# Patient Record
Sex: Female | Born: 1975 | Race: Black or African American | Hispanic: No | Marital: Married | Smoking: Never smoker
Health system: Southern US, Community
[De-identification: ages and names within clinical notes are randomized; demographics above are authoritative.]

## PROBLEM LIST (undated history)

## (undated) DIAGNOSIS — I10 Essential (primary) hypertension: Secondary | ICD-10-CM

## (undated) DIAGNOSIS — G510 Bell's palsy: Secondary | ICD-10-CM

## (undated) DIAGNOSIS — G459 Transient cerebral ischemic attack, unspecified: Secondary | ICD-10-CM

## (undated) HISTORY — PX: CERVICAL CERCLAGE: SHX1329

## (undated) HISTORY — DX: Essential (primary) hypertension: I10

---

## 2001-08-11 ENCOUNTER — Encounter: Admission: RE | Admit: 2001-08-11 | Discharge: 2001-08-11 | Payer: Self-pay | Admitting: *Deleted

## 2001-08-11 ENCOUNTER — Inpatient Hospital Stay (HOSPITAL_COMMUNITY): Admission: AD | Admit: 2001-08-11 | Discharge: 2001-08-18 | Payer: Self-pay | Admitting: *Deleted

## 2001-08-25 ENCOUNTER — Encounter: Admission: RE | Admit: 2001-08-25 | Discharge: 2001-08-25 | Payer: Self-pay | Admitting: *Deleted

## 2001-09-01 ENCOUNTER — Encounter: Admission: RE | Admit: 2001-09-01 | Discharge: 2001-09-01 | Payer: Self-pay | Admitting: *Deleted

## 2001-09-06 ENCOUNTER — Encounter: Payer: Self-pay | Admitting: *Deleted

## 2001-09-06 ENCOUNTER — Encounter (HOSPITAL_COMMUNITY): Admission: RE | Admit: 2001-09-06 | Discharge: 2001-10-06 | Payer: Self-pay | Admitting: *Deleted

## 2001-09-29 ENCOUNTER — Inpatient Hospital Stay (HOSPITAL_COMMUNITY): Admission: AD | Admit: 2001-09-29 | Discharge: 2001-10-02 | Payer: Self-pay | Admitting: *Deleted

## 2001-10-07 ENCOUNTER — Encounter (HOSPITAL_COMMUNITY): Admission: RE | Admit: 2001-10-07 | Discharge: 2001-11-06 | Payer: Self-pay | Admitting: *Deleted

## 2001-11-02 ENCOUNTER — Inpatient Hospital Stay (HOSPITAL_COMMUNITY): Admission: AD | Admit: 2001-11-02 | Discharge: 2001-11-16 | Payer: Self-pay | Admitting: Obstetrics and Gynecology

## 2001-11-02 ENCOUNTER — Encounter: Payer: Self-pay | Admitting: Obstetrics and Gynecology

## 2001-11-02 ENCOUNTER — Encounter (INDEPENDENT_AMBULATORY_CARE_PROVIDER_SITE_OTHER): Payer: Self-pay | Admitting: Specialist

## 2001-11-07 ENCOUNTER — Encounter: Payer: Self-pay | Admitting: Obstetrics and Gynecology

## 2001-11-18 ENCOUNTER — Inpatient Hospital Stay (HOSPITAL_COMMUNITY): Admission: AD | Admit: 2001-11-18 | Discharge: 2001-11-18 | Payer: Self-pay | Admitting: Obstetrics and Gynecology

## 2003-07-03 ENCOUNTER — Encounter: Admission: RE | Admit: 2003-07-03 | Discharge: 2003-07-03 | Payer: Self-pay | Admitting: *Deleted

## 2003-07-03 ENCOUNTER — Encounter (INDEPENDENT_AMBULATORY_CARE_PROVIDER_SITE_OTHER): Payer: Self-pay | Admitting: *Deleted

## 2003-07-09 ENCOUNTER — Encounter: Payer: Self-pay | Admitting: *Deleted

## 2003-07-09 ENCOUNTER — Inpatient Hospital Stay (HOSPITAL_COMMUNITY): Admission: RE | Admit: 2003-07-09 | Discharge: 2003-07-12 | Payer: Self-pay | Admitting: *Deleted

## 2003-07-19 ENCOUNTER — Encounter: Admission: RE | Admit: 2003-07-19 | Discharge: 2003-07-19 | Payer: Self-pay | Admitting: Family Medicine

## 2003-07-25 ENCOUNTER — Encounter: Admission: RE | Admit: 2003-07-25 | Discharge: 2003-07-25 | Payer: Self-pay | Admitting: *Deleted

## 2003-08-02 ENCOUNTER — Encounter: Admission: RE | Admit: 2003-08-02 | Discharge: 2003-08-02 | Payer: Self-pay | Admitting: Family Medicine

## 2003-08-09 ENCOUNTER — Encounter: Admission: RE | Admit: 2003-08-09 | Discharge: 2003-08-09 | Payer: Self-pay | Admitting: *Deleted

## 2003-08-15 ENCOUNTER — Encounter: Admission: RE | Admit: 2003-08-15 | Discharge: 2003-08-15 | Payer: Self-pay | Admitting: Obstetrics & Gynecology

## 2003-08-22 ENCOUNTER — Encounter: Admission: RE | Admit: 2003-08-22 | Discharge: 2003-08-22 | Payer: Self-pay | Admitting: *Deleted

## 2003-08-29 ENCOUNTER — Encounter: Admission: RE | Admit: 2003-08-29 | Discharge: 2003-08-29 | Payer: Self-pay | Admitting: *Deleted

## 2003-09-05 ENCOUNTER — Inpatient Hospital Stay (HOSPITAL_COMMUNITY): Admission: RE | Admit: 2003-09-05 | Discharge: 2003-09-07 | Payer: Self-pay | Admitting: Obstetrics & Gynecology

## 2003-09-05 ENCOUNTER — Encounter: Admission: RE | Admit: 2003-09-05 | Discharge: 2003-09-05 | Payer: Self-pay | Admitting: *Deleted

## 2003-09-12 ENCOUNTER — Encounter: Admission: RE | Admit: 2003-09-12 | Discharge: 2003-09-12 | Payer: Self-pay | Admitting: *Deleted

## 2003-09-19 ENCOUNTER — Encounter: Admission: RE | Admit: 2003-09-19 | Discharge: 2003-09-19 | Payer: Self-pay | Admitting: *Deleted

## 2003-09-21 ENCOUNTER — Encounter: Admission: RE | Admit: 2003-09-21 | Discharge: 2003-09-21 | Payer: Self-pay | Admitting: Family Medicine

## 2003-09-26 ENCOUNTER — Encounter: Admission: RE | Admit: 2003-09-26 | Discharge: 2003-09-26 | Payer: Self-pay | Admitting: Obstetrics and Gynecology

## 2003-10-03 ENCOUNTER — Encounter: Admission: RE | Admit: 2003-10-03 | Discharge: 2003-10-03 | Payer: Self-pay | Admitting: *Deleted

## 2003-10-10 ENCOUNTER — Encounter: Admission: RE | Admit: 2003-10-10 | Discharge: 2003-10-10 | Payer: Self-pay | Admitting: *Deleted

## 2003-10-17 ENCOUNTER — Encounter: Admission: RE | Admit: 2003-10-17 | Discharge: 2003-10-17 | Payer: Self-pay | Admitting: *Deleted

## 2003-10-24 ENCOUNTER — Encounter: Admission: RE | Admit: 2003-10-24 | Discharge: 2003-10-24 | Payer: Self-pay | Admitting: *Deleted

## 2003-10-31 ENCOUNTER — Encounter: Admission: RE | Admit: 2003-10-31 | Discharge: 2003-10-31 | Payer: Self-pay | Admitting: *Deleted

## 2003-11-07 ENCOUNTER — Encounter: Admission: RE | Admit: 2003-11-07 | Discharge: 2003-11-07 | Payer: Self-pay | Admitting: *Deleted

## 2003-11-08 ENCOUNTER — Inpatient Hospital Stay (HOSPITAL_COMMUNITY): Admission: AD | Admit: 2003-11-08 | Discharge: 2003-11-11 | Payer: Self-pay | Admitting: Obstetrics and Gynecology

## 2003-11-08 ENCOUNTER — Encounter (INDEPENDENT_AMBULATORY_CARE_PROVIDER_SITE_OTHER): Payer: Self-pay | Admitting: *Deleted

## 2003-11-17 ENCOUNTER — Inpatient Hospital Stay (HOSPITAL_COMMUNITY): Admission: AD | Admit: 2003-11-17 | Discharge: 2003-11-17 | Payer: Self-pay | Admitting: Obstetrics and Gynecology

## 2003-11-20 ENCOUNTER — Encounter: Admission: RE | Admit: 2003-11-20 | Discharge: 2003-11-20 | Payer: Self-pay | Admitting: Obstetrics and Gynecology

## 2003-11-27 ENCOUNTER — Encounter: Admission: RE | Admit: 2003-11-27 | Discharge: 2003-11-27 | Payer: Self-pay | Admitting: Obstetrics and Gynecology

## 2003-12-04 ENCOUNTER — Encounter: Admission: RE | Admit: 2003-12-04 | Discharge: 2003-12-04 | Payer: Self-pay | Admitting: Obstetrics and Gynecology

## 2004-05-02 ENCOUNTER — Emergency Department: Payer: Self-pay | Admitting: Emergency Medicine

## 2008-03-06 ENCOUNTER — Encounter (INDEPENDENT_AMBULATORY_CARE_PROVIDER_SITE_OTHER): Payer: Self-pay | Admitting: Internal Medicine

## 2008-05-28 ENCOUNTER — Encounter (INDEPENDENT_AMBULATORY_CARE_PROVIDER_SITE_OTHER): Payer: Self-pay | Admitting: Internal Medicine

## 2008-06-11 ENCOUNTER — Ambulatory Visit: Payer: Self-pay | Admitting: Internal Medicine

## 2008-06-11 DIAGNOSIS — Z87898 Personal history of other specified conditions: Secondary | ICD-10-CM | POA: Insufficient documentation

## 2008-06-11 DIAGNOSIS — R030 Elevated blood-pressure reading, without diagnosis of hypertension: Secondary | ICD-10-CM

## 2008-06-11 HISTORY — DX: Personal history of other specified conditions: Z87.898

## 2008-08-15 ENCOUNTER — Ambulatory Visit: Payer: Self-pay | Admitting: Internal Medicine

## 2008-08-15 DIAGNOSIS — H1189 Other specified disorders of conjunctiva: Secondary | ICD-10-CM | POA: Insufficient documentation

## 2008-08-20 ENCOUNTER — Telehealth (INDEPENDENT_AMBULATORY_CARE_PROVIDER_SITE_OTHER): Payer: Self-pay | Admitting: Internal Medicine

## 2008-09-10 ENCOUNTER — Ambulatory Visit: Payer: Self-pay | Admitting: Internal Medicine

## 2009-01-17 ENCOUNTER — Encounter (INDEPENDENT_AMBULATORY_CARE_PROVIDER_SITE_OTHER): Payer: Self-pay | Admitting: Internal Medicine

## 2009-07-25 ENCOUNTER — Emergency Department (HOSPITAL_COMMUNITY): Admission: EM | Admit: 2009-07-25 | Discharge: 2009-07-25 | Payer: Self-pay | Admitting: Emergency Medicine

## 2010-05-10 ENCOUNTER — Encounter: Payer: Self-pay | Admitting: *Deleted

## 2010-05-11 ENCOUNTER — Encounter: Payer: Self-pay | Admitting: *Deleted

## 2010-09-05 NOTE — Discharge Summary (Signed)
[facility]  Patient:    Margaret Owen, Margaret Owen Visit Number: [id number] MRN: [id number]          Service Type: OBS Location: 910B 9162 01 Attending Physician:  Amada Kingfisher. Dictated by:   Kristen Loader, M.D. Admit Date:  11/02/2001 Disc. Date: 10/02/01                             Discharge Summary  HISTORY OF PRESENT ILLNESS:  The patient is a 35 year old para 0-1-2-1, at 22 weeks, who had a cerclage done at 16 weeks, is admitted with contractions for tocolysis, and fluid support and observation.  The patient did well during her hospitalization.  This reliable patient was discharged on complete bedrest at home after having arranged for home care.  DISCHARGE MEDICATIONS:  IV therapy.  FOLLOWUP:  Return to [facility] in one week.  DISCHARGE DIAGNOSES: 1. Threatened preterm labor. 2. Cerclage. Dictated by:   Kristen Loader, M.D. Attending Physician:  Amada Kingfisher. DD:  11/09/01 TD:  11/14/01 Job: [id number]

## 2010-09-05 NOTE — Op Note (Signed)
[facility]  Patient:    Margaret Owen, Margaret Owen Visit Number: [id number] MRN: [id number]          Service Type: OBS Location: 9400 [id number] [id number] Attending Physician:  Enid Cutter Dictated by:   Conni Elliot, M.D. Proc. Date: 08/12/01 Admit Date:  08/11/2001                             Operative Report  PREOPERATIVE DIAGNOSIS:       Decreased cervical resistance.  POSTOPERATIVE DIAGNOSIS:      Decreased cervical resistance.  OPERATION:                    McDonald cerclage rescue.  SURGEON:                      Conni Elliot, M.D.  ANESTHESIA:                   Spinal.  OPERATIVE FINDINGS:           The cervix was approximately 3-4 cm dilated, with the bag of waters doming a couple of centimeters beyond the external os. The patient was in Trendelenburg position.  The perineum and vagina were prepped with Betadine.  A weighted speculum was placed in the posterior vagina.  The anterior cervix was grasped.  ______ was provided.  A saline-soaked sponge was utilized to retract the membranes back up into the lower segment.  A cerclage was placed using #4 silk double stranded, starting at 12 oclock in a counterclockwise fashion.  This was gradually tied down, with gradual removal of the saline pack, and membranes were still cephalad to the cerclage after tying.  ______ was then reapplied after the procedure. The patient was taken to the recovery room and Foley catheter to straight drainage. Dictated by:   Conni Elliot, M.D. Attending Physician:  Enid Cutter DD:  08/12/01 TD:  08/13/01 Job: [id number]

## 2010-09-05 NOTE — Op Note (Signed)
NAME:  Margaret Owen, Margaret Owen                      ACCOUNT NO.:  [id number]   MEDICAL RECORD NO.:  [id number]                   PATIENT TYPE:  INP   LOCATION:  9133                                 FACILITY:  [facility]   PHYSICIAN:  Tanya S. Shawnie Pons, M.D.                DATE OF BIRTH:  April 16, 1976   DATE OF PROCEDURE:  11/08/2003  DATE OF DISCHARGE:                                 OPERATIVE REPORT   PREOPERATIVE DIAGNOSES:  Intrauterine pregnancy at 36 weeks, early labor,  previous cesarean section x2.   POSTOPERATIVE DIAGNOSES:  Intrauterine pregnancy at 36 weeks, early labor,  previous cesarean section x2.   PROCEDURE:  Repeat low transverse cesarean section.   SURGEON:  Shelbie Proctor. Shawnie Pons, M.D.   ANESTHESIA:  Spinal, Dorinda Hill T. Pamalee Leyden, M.D.   FINDINGS:  Viable female infant, Apgars 7 & 9, weight 5 pounds 1 ounce.   ESTIMATED BLOOD LOSS:  1000 mL.   COMPLICATIONS:  None.   SPECIMENS:  Placenta to pathology.   INDICATIONS FOR PROCEDURE:  The patient is a 35 year old, gravida 6, para 0-  2-3-2 who is at 54 and 1 on admission who was bleeding. She had previously  had a cerclage and __________ was removed the day prior to the cesarean  section. On the day of the C section underwent amniocentesis for fetal lung  maturity which was found to be mature with an __________ of 9:1 and positive  PD.  The patient was scheduled for a C section on the following morning;  however, on the evening after the C section, she developed painful uterine  contractions that did not slow down. She had demonstrated cervical change  and for that reason she was counseled for having a cesarean section that  evening.   DESCRIPTION OF PROCEDURE:  The patient was taken to the OR where spinal  analgesia was administered. She was then placed in a supine position with a  left lateral tilt. She was prepped and draped in the usual sterile fashion  and a Foley catheter was used to drain the bladder.  When anesthesia was  found  to be adequate, a Pfannenstiel incision was made through an old scar.  This was carried down to the underlying fascia with electrocautery.  The  fascia was incised longitudinally with electrocautery.  On dissecting the  superior edge of the fascia off of the underlying rectus, the peritoneal  cavity was entered.  This incision was extended by the surgeon assistant  beginning on either side of the incision. A bladder blade was then placed  inside the abdominal cavity, the bladder was noted to be adherent but down  and a knife was used to make an incision in the lower uterine segment.  The  lower uterine segment was somewhat thick and a moderate amount of bleeding  was noted. Once the amniotic cavity was entered, the uterine incision was  extended with bandage scissors bilaterally.  The  infant was in a vertex  presentation but the head was down in the vagina. This was pulled up and  delivered easily. The head was delivered and then membranes were ruptured  and found to have clear fluid. The nuchal cord was removed, rest of the  infant's body delivered.  The cord was clamped x2, the infant was taken to  the waiting pediatricians.  The placenta was then delivered. The edges of  the uterine incision were grasped and the uterus was closed with a #0 Vicryl  suture in a locked running fashion; however, in delivery of the baby, there  was extension of the patient's left angle into the cervix, this was closed  with #0 Vicryl locked running as well and there was an approximately 1 cm  incision going up into the uterine fundus which was also closed with #0  Vicryl locked running. After the incision was closed, there was noted to be  bleeding on both the right and left edges. __________ pressure and Avitene  were used until hemostasis was complete.  The abdominal cavity was  irrigated, the incision looked at and still found to be hemostatic. At that  point, the fascia was closed with a #0 Vicryl suture  in a running fashion,  subcutaneous tissue irrigated and the bleeders cauterized with the  electrocautery and the skin closed using clips.  `10 mL of 0.25% Marcaine  was then injected into the incision.  The patient tolerated the procedure  well. She was awakened and taken to the recovery room in stable condition.  The infant was taken to the newborn nursery also in stable condition.                                               Shelbie Proctor. Shawnie Pons, M.D.    TSP/MEDQ  D:  11/08/2003  T:  11/09/2003  Job:  [id number]

## 2010-09-05 NOTE — Discharge Summary (Signed)
[facility]  Patient:    Margaret Owen, Margaret Owen Visit Number: [id number] MRN: [id number]          Service Type: MED Location: [id number] Attending Physician:  Enid Cutter Dictated by:   Vear Clock, M.D. Admit Date:  08/11/2001 Discharge Date: 08/18/2001                             Discharge Summary  DISCHARGE DIAGNOSES:          1. A 35 and 0 week intrauterine pregnancy.                               2. Postoperative day six status post cerclage                                  placement.                               3. Preterm labor.  DISCHARGE MEDICATIONS:        1. Procardia XL 30 mg p.o. q.d.                               2. Cleocin 150 mg tablets dissolve in                                  one half cup of water and douche q.h.s.  FOLLOW-UP:                    The patient is to follow up at [facility] on Aug 25, 2001.  She should call to schedule an appointment.  PROCEDURES AND DIAGNOSTIC STUDIES:                      Complete OB ultrasound on August 11, 2001 shows single living IUP, 35 and 4 days with an [facility] of January 30, 2002 which is approximately two weeks behind her LMP; however, since this was a second trimester ultrasound we will go with her initial LMP dating which was January 20, 2002.  Limited anatomic evaluation secondary to early gestational age. Dilated cervix with internal os measuring 2.4 cm in diameter.  Normal AFI.  An operative procedure for McDonald cerclage rescue on August 12, 2001 performed by Dr. Corky Sox.  ADMISSION H&P:                In short, this is a 35 year old, G4, P0-1-2-1 who presented to the [facility] for a new OB visit and was found to have cervical dilatation of 2 to 3 cm, who was sent over for a rescue cerclage. The patient of note, also did require a cerclage placed early in her previous pregnancy and the patient delivered at 28 weeks by [facility] after SROM.  HOSPITAL COURSE:               Cerclage as placed on August 12, 2001.  The patient was found to have decreased cervical resistance.  She was started on Unasyn on admission and has remained afebrile and asymptomatic since admission.  She has been in  Trendelenburg throughout her hospitalization and been on Motrin.  She has also received Cleocin douches every night since the procedure.  The patient did not develop any new problems during her hospitalization.  DISCHARGE LABORATORY DATA:    WBC 10.3, hemoglobin 12.4, platelets 217. Hepatitis B surface antigen negative.  UA on August 11, 2001 showed trace hemoglobin and otherwise was negative.  A repeat UA on Aug 18, 2001 showed blood but no other concerns.  The patient is B positive, antibody negative. GBS negative.  RPR negative. Rubella immune.  No further labs were performed.  The patient was discharged to home without further incident.  Of note, she had been on magnesium after placement of the cerclage for believed preterm labor despite the fact she was not feeling any contractions.  This was discontinued prior to her discharge and she tolerated this well.  She was also started on Procardia XL prior to discharge without any further difficulty. Dictated by:   Vear Clock, M.D. Attending Physician:  Enid Cutter DD:  08/18/01 TD:  08/22/01 Job: [id number]

## 2010-09-05 NOTE — Discharge Summary (Signed)
NAME:  Margaret Owen, Margaret Owen                      ACCOUNT NO.:  [id number]   MEDICAL RECORD NO.:  [id number]                   PATIENT TYPE:  INP   LOCATION:  9133                                 FACILITY:  [facility]   PHYSICIAN:  Phil D. Okey Dupre, M.D.                  DATE OF BIRTH:  1975-05-01   DATE OF ADMISSION:  11/08/2003  DATE OF DISCHARGE:  11/11/2003                                 DISCHARGE SUMMARY   ADMISSION DIAGNOSES:  12. A 35 year old gravida 6 para 0-2-3-2 at 90 and 1 with vaginal bleeding.  2. Cerclage for cervical incompetence.   DISCHARGE DIAGNOSES:  18. A 35 year old gravida 6 para 0-3-3-3 postoperative day #3 status post     repeat lower transverse cesarean section for laboring with a history of     two previous cesarean sections.  2. Viable female with Apgars of 7 at one minute and 9 at five minutes.  3. Postoperative fever x1.   DISCHARGE MEDICATIONS:  1. Percocet 5/325 one to two p.o. q.4-6h. p.r.n. #30.  2. Ibuprofen 600 mg one p.o. q.6h. p.r.n.  3. Depo-Provera injection 150 mg IM x1.  4. Prenatal vitamin one p.o. daily.   ADMISSION HISTORY:  Margaret Owen was admitted to the hospital at 36 and 1 day  for vaginal bleeding.  Her cerclage which was in place at the time of  admission was removed and she was admitted for observation.   HOSPITAL COURSE:  On hospital day #1 she was noted to go into spontaneous  labor.  Because of her two previous C-sections she was taken for a repeat  lower transverse C-section.  Please see that operative note.   POSTOPERATIVE COURSE:  Margaret Owen had a fairly uncomplicated postoperative  course.  She did have one fever following C-section to 101.3.  Following  that fever she defervesced and remained afebrile throughout the rest of her  hospital stay.  She was afebrile for about 48 hours on day of discharge.  Her postoperative hemoglobin was noted to be 10.6.  She is breastfeeding and  her pain is well controlled.   CONDITION ON  DISCHARGE:  Margaret Owen was discharged to home in stable  condition.  Her staples were removed on day of discharge.   INSTRUCTIONS GIVEN TO PATIENT:  The patient was told of her above medical  regimen.  She is to follow up at [facility] in 6 weeks.     Jon Gills, M.D.                     Phil D. Okey Dupre, M.D.    LC/MEDQ  D:  11/11/2003  T:  11/11/2003  Job:  [id number]

## 2010-09-05 NOTE — Discharge Summary (Signed)
NAME:  Margaret Owen, Margaret Owen                      ACCOUNT NO.:  [id number]   MEDICAL RECORD NO.:  [id number]                   PATIENT TYPE:  MAT   LOCATION:  MATC                                 [facility]ACILITY:  [facility]   PHYSICIAN:  Phil D. Okey Dupre, M.D.                  DATE OF BIRTH:  07/13/1975   DATE OF ADMISSION:  11/02/2001  DATE OF DISCHARGE:  11/16/2001                                 DISCHARGE SUMMARY   ADMITTING DIAGNOSES:  Preterm premature rupture of membranes.   DISCHARGE DIAGNOSES:  Preterm delivery of viable newborn by cesarean  section.   HISTORY:  The patient is a 35 year old G5, P0-1-3-1 who presented to the  maternity admissions unit at [redacted] weeks gestation by LMP and second trimester  ultrasound with rupture of membranes.  She denied any contractions or  vaginal bleeding and reported good fetal activity.  She reported clear  vaginal fluid discharge at approximately 2:50 p.m. on the day of admission.  She reported that this vaginal fluid soaked three pads.  On August 12, 2001 a  rescue cerclage had been placed by Dr. Gavin Potters secondary to a speculum  examination on April 24 which showed a cervix that was dilated 2-3 cm with  membranes bulging, but not hourglassing.  Since the cerclage was placed the  patient had been taking Procardia XL 30 mg p.o. q.d.  She also reported  taking amoxicillin 500 mg p.o. q.h.s. and clindamycin douches.  She had been  on bed rest with bathroom privileges since the cerclage had been placed and  had been followed closely by Dr. Gavin Potters in the [facility].  During  this pregnancy she was found to have GBS bacteruria and was admitted for IV  antibiotics around [redacted] weeks gestation.  A urine culture at 23-1/2 weeks grew  out Enterobacteria which was treated with Septra.  She received two doses of  betamethasone on July 8 and July 9 as an outpatient.  Her previous OB  history included a TAB at 16 weeks in 1995, an SAB in 1996, an SAB at 16  weeks in  1997, and a cesarean section for breech at 27 weeks in 2000 which  resulted in the birth of a viable female.  She had a history of a weak  cervix since her TAB in 1995.  A cerclage had been placed with her pregnancy  in 1997 which had failed resulting in an SAB.  Two cerclages had been placed  during her fourth gestation in 2000 which resulted in the preterm birth at  23 weeks at [facility].  On admission the patient was afebrile with normal vital  signs and a benign physical examination.  A speculum examination showed  pooling and a visually closed cervix with the cerclage.  The pooled fluid  was positive for Nitrazine and ferning.  The digital cervical examination  was deferred.  External fetal monitoring showed a baseline  rate in the 150s  with good variability and some mild variable decelerations.  No contractions  were noted, although there was some uterine irritability.   HOSPITAL COURSE:  The patient was admitted to labor and delivery.  A  catheterized urine was sent for UA and culture and she was started on Unasyn  3 g IV q.6h., placed in the Trendelenburg position on strict bed rest.  No  betamethasone was given since she had received two doses one week prior to  admission.  An OB ultrasound was obtained on the day of admission which  showed a single intrauterine pregnancy with normal AFI of 17.8 cm, no  previa, and an assigned gestational age of 2 and 1/7 weeks.  The cervix was  described at 3-4 cm in length.  The UA was within normal limits and the  clean catch urine culture showed 45,000 colonies per ml of group B Strep.  Throughout her hospital course the patient remained afebrile.  She continued  to report a small amount of leaking fluid per her vagina, but no large  gushes of fluid.  A repeat ultrasound was performed on hospital day number  two which found an AFI of 9.4 cm, a decrease from the day prior and a value  that is considered low.  At that time the estimated fetal weight  was in the  75th-90th percentile at 1049 g.  The cerclage was found to be intact.  Throughout her hospital course the fluid that leaked per her vagina was  without any odor.  Fetal heart tracings continued to be reassuring and no  contractions were reported by the patient or picked up on external  monitoring.  The patient continued to report good fetal movement.  External  fetal monitoring was performed for 30 minutes every shift secondary to an  inability to do continuous tracing of such a premature baby.  A third  ultrasound was performed on July 21 which found an AFI of 6.6 illustrating a  continued trend down in the amniotic fluid level.  A BPP at that time was  8/8 and reassuring.  On hospital day number eight the patient reported a  small amount of pale, watery, bloody vaginal discharge.  Fetal heart  tracings continued to remain reassuring.  The patient remained afebrile  without uterine tenderness.  A hour one Glucola was performed on July 23  which was within normal limits.  On hospital day number 10 at 28 and [redacted] weeks  gestation the patient reported loss of a small amount of blood tinged fluid  and passage of an approximately 3 cm dark blood clot.  She denied any other  loss of fluid since hospital day number eight.  She also denied any  contractions, abdominal pain or tenderness, and reported good fetal  movement.  Fetal heart tracings, again, remained reassuring.  The cerclage  was left intact at this point and the patient was closely observed for  further bleeding which was not found to occur.  On hospital day number 11 at  28 and [redacted] weeks gestation the [facility] teaching service was notified of some  repetitive moderate to severe variable decelerations which were picked up on  external fetal monitoring.  Fetal heart rates were found to decrease from a  baseline of approximately 150 down to 55 to 70 beats per minute for 40-50 seconds.  Following each deceleration the fetal heart rate  increased to  baseline or slightly greater than baseline from 140-160 beats per  minute.  There was no gross frank vaginal bleeding, only small flecks of dried blood  as had been common for the patient in the previous couple of days.  The  patient did report feeling some contractions during this spell of variable  decelerations but the tocometer was unable to pick up these contractions  well.  The on-call physician was notified and came in to monitor the  patient.  No immediate action was taken as the fetal heart rate returned to  baseline and was more reassuring.  The patient remained afebrile with no  signs or symptoms of infection.  She was still on her Unasyn as she had been  since admission.  Later that morning after discussion with the team, the  patient's cerclage was removed without difficulty.  At that time her cervix  was found to be dilated 1-2 cm, 80% effaced with a vertex presentation at -  2/-3 station.  An epidural was placed, but was turned off at approximately  2:15 on hospital day number eleven to determine if the patient was having  any uterine contractions since none were being picked up on external  monitoring.  The patient denied feeling any contractions.  The patient  continued to have some variable decelerations with heart rates going down  into the 60s.  She was repositioned, placed on O2, and monitored closely.  An attempt was made to place an IUPC, but was unsuccessful.  Some mild fetal  tachycardia was noted on hospital day number 12, but resolved after Tylenol  was given to the patient.  Early on hospital day number 12 the patient was  noted to have frank bleeding from the vagina and a probable abruption.  The  fetal heart rate was stable and the patient was emergently taken to the  operating room for a stat low transverse cesarean section.  At the time of  the surgery it was noted that the lower uterine segment was extremely thin.  A viable female infant with  Apgars of 6, 6, and 8 at one, five, and 10  minutes, respectively was delivered.  Arterial cord pH was 7.31.  Estimated  blood loss was approximately 800 cc.  The patient recovered well status post  cesarean section and her baby did well in the NICU.  The patient was started  on iron 325 mg p.o. q.d. for a hemoglobin of 8.9 on postoperative day number  one.  Her incision was clean, dry, and intact with staples.  The patient did  complain of some burning with urination which began after her catheter was  removed.  A urinalysis was within normal limits.  The patient was pumping  her breasts and desired Micronor for birth control.  Her staples were left  in for a total of five days (removed after discharge) to allow a small area  of granulation tissue to heal more thoroughly.  The possible risks of future  labor secondary to her thin uterine wall which was noted during the cesarean section were discussed with the patient.  It was the recommendation of Dr.  Gavin Potters that the patient not attempt any future labors.  On hospital day  number 15 the patient was recovering well from her cesarean section and was  deemed ready for discharge.  Her baby remained in the NICU where it  continued to do well on CPAP and taking feedings.   DISPOSITION:  Discharged to home.   CONDITION ON DISCHARGE:  Stable.   DISCHARGE MEDICATIONS:  1.  Percocet 5/325 mg one p.o. q.4h. p.r.n. pain.  2. Ibuprofen 600 mg one p.o. q.6h. p.r.n. cramps.  3. Micronor one pill p.o. q.d.  4. Colace 100 mg one p.o. q.h.s. p.r.n. constipation.  5. Ferrous sulfate 325 mg one p.o. q.d. for anemia.  6. Prenatal vitamins one p.o. q.d.   WOUND CARE:  The patient was instructed that she could place a Band-aid over  the small area of her cesarean section incision that was still healing to  help keep it dry and encourage the healing.   DIET:  Regular.   ACTIVITY:  As instructed per booklet with nothing in her vagina for six   weeks.   FOLLOWUP CARE:  The patient is to return to the maternity admission unit at  [facility] two days after discharge for staple removal.  She is also  to return to [facility] in six weeks for follow-up.  The patient was  instructed to call Women's [facility] for an appointment.     Georgina Peer, M.D.                 Phil D. Okey Dupre, M.D.    JM/MEDQ  D:  12/15/2001  T:  12/16/2001  Job:  [id number]   cc:   [facility]

## 2010-09-05 NOTE — Discharge Summary (Signed)
NAME:  SEPTEMBER, MORMILE                      ACCOUNT NO.:  [id number]   MEDICAL RECORD NO.:  [id number]                   PATIENT TYPE:  INP   LOCATION:  9318                                 FACILITY:  [facility]   PHYSICIAN:  Conni Elliot, M.D.             DATE OF BIRTH:  03/16/1976   DATE OF ADMISSION:  07/09/2003  DATE OF DISCHARGE:  07/12/2003                                 DISCHARGE SUMMARY   HISTORY OF PRESENT ILLNESS:  This is a 35 year old gravida 6 term 0 preterm  2 AB 3 living 2 who was being followed in the high risk clinic who presented  to the hospital on July 10, 2003 for placement of a cerclage.  The patient  had a history of preterm delivery probably secondary to an incompetent  cervix.  The decision was made to place a cerclage.   HOSPITAL COURSE:  The patient did undergo a cerclage placement by Dr. Corky Sox on July 10, 2003 and had a normal hospital course.  She did receive  antibiotics and on July 12, 2003 was stable with no complaints, and was  discharged home.   DISCHARGE INSTRUCTIONS:  1. Bedrest until seen in the high risk clinic.  2. No intercourse.  3. Follow up in the high risk clinic in 1 week.   DISCHARGE DIAGNOSES:  1. History of incompetent cervix.  2. Status post cerclage placement.   MEDICATIONS:  Prenatal vitamins one p.o. daily.     Darl Pikes Christmas, C.N.M.                   Conni Elliot, M.D.    Brent/MEDQ  D:  09/09/2003  T:  09/10/2003  Job:  [id number]

## 2010-09-05 NOTE — Op Note (Signed)
NAME:  Margaret Owen, Margaret Owen                      ACCOUNT NO.:  [id number]   MEDICAL RECORD NO.:  [id number]                   PATIENT TYPE:  INP   LOCATION:  9318                                 [facility]ILITY[facility]   PHYSICIAN:  Conni Elliot, M.D.             DATE OF BIRTH:  1975/12/23   DATE OF PROCEDURE:  07/10/2003  DATE OF DISCHARGE:                                 OPERATIVE REPORT   PREOPERATIVE DIAGNOSIS:  Decreased cervical resistance.   POSTOPERATIVE DIAGNOSIS:  Decreased cervical resistance.   OPERATION:  McDonald cerclage.   DATE OF SURGERY:  July 10, 2003   OPERATOR:  Conni Elliot, M.D.   ANESTHESIA:  Spinal.   DESCRIPTION OF PROCEDURE:  After placing the patient under spinal anesthetic  the patient was placed in the dorsal lithotomy position __________.  The  perineum and vagina were prepped with Betadine solution and Foley catheter  placed to straight drainage.  A weighted speculum was placed in the  posterior vagina.  The anterior cervix was grasped with a sponge stick and  #4 silk double stranded was utilized to place a cervical cerclage starting  at 12 o'clock and going in a counter-clockwise fashion.  This was tied at 12  o'clock.  A clindamycin douche was applied at the end of the procedure.  Estimated blood loss was less than 5 mL and instruments were correct.                                               Conni Elliot, M.D.    ASG/MEDQ  D:  07/10/2003  T:  07/11/2003  Job:  [id number]

## 2010-09-05 NOTE — Discharge Summary (Signed)
NAME:  Margaret Owen, Margaret Owen                      ACCOUNT NO.:  [id number]   MEDICAL RECORD NO.:  [id number]                   PATIENT TYPE:  INP   LOCATION:  9173                                 [facility]ILITY[facility]   PHYSICIAN:  Lesly Dukes, M.D.              DATE OF BIRTH:  1976-02-17   DATE OF ADMISSION:  09/05/2003  DATE OF DISCHARGE:                                 DISCHARGE SUMMARY   ATTENDING PHYSICIAN:  Lesly Dukes, M.D.   INTERN:  Ursula Beath, M.D.   DISCHARGE DIAGNOSES:  1. Intrauterine pregnancy at 27 and 3 days at discharge.  2. Shortened cervix.  3. Incompetent cervix.   DISCHARGE MEDICATIONS:  1. Amoxicillin 500 mg p.o. b.i.d. x7 days.  2. Diflucan 150 mg one p.o. every 3 days for three doses.  3. Prenatal vitamins one p.o. daily.  4. Calcium one p.o. daily.   DIET AT HOME:  Regular.   ACTIVITY:  Bedrest with bathroom privileges.   SEXUAL ACTIVITY:  Do not do anything to make yourself have an orgasm.   She is given preterm labor precautions.   FOLLOW-UP:  At high risk clinic.  She has a previously-scheduled appointment  next Wednesday.   LABORATORY PERFORMED DURING HOSPITALIZATION:  Include a fetal fibronectin  that was negative.  A wet prep that showed few white blood cells; otherwise  negative.  GC and chlamydia probes that were negative.  She had a GBS screen  that was pending at the time of discharge.  She had a transvaginal  ultrasound the day of admission showing a cephalic presentation with an  anterior grade 1 placenta, no previa; a 1.8 cm cervix which is dynamic and  funneling.   HOSPITAL COURSE:  The patient was admitted for bedrest.  She underwent  betamethasone treatment of which she got two doses over the course of the 2-  day admission.  She also received IV Unasyn and had prenatal labs and  cultures collected.  During her hospitalization she did have a few  contractions although they went away after she emptied her bladder.  These  were while she was asleep and did not awaken her the first day of admission.  After that she had no contractions.  Her cervix did not change upon sterile  vaginal exam during her hospitalization.  She  received betamethasone treatment and antibiotic therapy and will be  discharged with oral antibiotics until her GBS cultures come back.  The  patient is discharged to home in improved condition with previously-  mentioned medications and follow-up.     Ursula Beath, MD                     Lesly Dukes, M.D.    JT/MEDQ  D:  09/07/2003  T:  09/07/2003  Job:  [id number]

## 2010-09-05 NOTE — Op Note (Signed)
   NAME:  Margaret Owen, Margaret Owen                      ACCOUNT NO.:  [id number]   MEDICAL RECORD NO.:  [id number]                   PATIENT TYPE:  INP   LOCATION:  9107                                 [facility]ILITY:  [facility]   PHYSICIAN:  Conni Elliot, M.D.             DATE OF BIRTH:  06-24-1975   DATE OF PROCEDURE:  11/13/2001  DATE OF DISCHARGE:  11/16/2001                                 OPERATIVE REPORT   PREOPERATIVE DIAGNOSES:  1. Premature prolonged rupture of membranes at [redacted] week gestation.  2. 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  3. Placental abruption.   POSTOPERATIVE DIAGNOSES:  1. Premature prolonged rupture of membranes at [redacted] weeks gestation.  2. 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  3. Placental abruption.   OPERATION:  Low transverse cesarean.   SURGEON:  Conni Elliot, M.D.   OPERATIVE FINDINGS:  A female infant with Apgars of 6, 6, and 8 at one,  five, and ten minutes respectively.  Cord pH was 7.31.  Placenta was sent to  pathology.   DESCRIPTION OF PROCEDURE:  The patient was brought in an urgent fashion to  the operating room receiving oxygen and was prepped and draped in a sterile  fashion.  A low transverse Pfannenstiel incision was made from the prior  scar.  The incision was made through the skin and subcutaneous fascia.  To  be noted there appeared to be very thin, almost no fascia in the midline.  The peritoneal cavity was entered.  The bladder flap was created.  It also  should be noted that the lower uterine segment was extremely thin and I  would not recommend future labors.  The uterine cavity was entered.  Amniotic fluid was clear.  The baby was in a vertex presentation.  The cord  was milked and then double-clamped and cut and the baby handed immediately  to the neonatologist in attendance.  The placenta was delivered manually.  The uterus, bladder flap, anterior peritoneum, fascia, subcutaneous tissue  and skin were closed.   Estimated blood loss approximately 800 cc.  Needle  and sponge counts correct.                                               Conni Elliot, M.D.    ASG/MEDQ  D:  11/13/2001  T:  11/17/2001  Job:  [id number]

## 2014-10-09 ENCOUNTER — Other Ambulatory Visit (HOSPITAL_COMMUNITY): Payer: Self-pay | Admitting: Family Medicine

## 2014-10-09 DIAGNOSIS — Z1231 Encounter for screening mammogram for malignant neoplasm of breast: Secondary | ICD-10-CM

## 2014-10-29 ENCOUNTER — Ambulatory Visit (HOSPITAL_COMMUNITY): Payer: Self-pay

## 2015-12-25 ENCOUNTER — Ambulatory Visit (INDEPENDENT_AMBULATORY_CARE_PROVIDER_SITE_OTHER): Payer: BLUE CROSS/BLUE SHIELD | Admitting: Advanced Practice Midwife

## 2015-12-25 ENCOUNTER — Encounter: Payer: Self-pay | Admitting: Advanced Practice Midwife

## 2015-12-25 VITALS — BP 144/94 | HR 80 | Wt 178.0 lb

## 2015-12-25 DIAGNOSIS — Z30431 Encounter for routine checking of intrauterine contraceptive device: Secondary | ICD-10-CM | POA: Diagnosis not present

## 2015-12-25 DIAGNOSIS — N898 Other specified noninflammatory disorders of vagina: Secondary | ICD-10-CM | POA: Diagnosis not present

## 2015-12-25 NOTE — Progress Notes (Signed)
[facility] Visit  Patient name: Margaret Owen MRN [id number]  Date of birth: January 20, 1976  CC & HPI:  Margaret Owen is a 40 y.o. African American female presenting today for IUD check. She had a Mirena placed 4 years ago.  Husband had been able to feel string up until a few months ago.  Her periods are about the same, sometimes lighter.  Had BV a year ago.  Gets "an odor" right beofre bleeding sometimes.  Not fishy.  No irritaion or iticing.     Pertinent History Reviewed:  Medical & Surgical Hx:   History reviewed. No pertinent past medical history. Past Surgical History:  Procedure Laterality Date  . CESAREAN SECTION    . CESAREAN SECTION  08/17/1998   Family History  Problem Relation Age of Onset  . Heart failure Father   . Stroke Mother   . Hypertension Mother   . Breast cancer Maternal Aunt   . Colon cancer Maternal Aunt    No current outpatient prescriptions on file. Social History: Reviewed -  reports that she has never smoked. She has never used smokeless tobacco.  Review of Systems:    Constitutional: Negative for fever and chills Eyes: Negative for visual disturbances Respiratory: Negative for shortness of breath, dyspnea Cardiovascular: Negative for chest pain or palpitations  Gastrointestinal: Negative for vomiting, diarrhea and constipation; no abdominal pain Genitourinary: Negative for dysuria and urgency, vaginal irritation or itching Musculoskeletal: Negative for back pain, joint pain, myalgias  Neurological: Negative for dizziness and headaches    Objective Findings:  Vitals: BP (!) 144/94   Pulse 80   Wt 178 lb (80.7 kg)   LMP 12/17/2015   BMI 30.55 kg/m   Physical Examination: General appearance - well appearing, and in no distress Mental status - alert, oriented to person, place, and time Chest:  Normal respiratory effort Heart - normal rate and regular rhythm Abdomen:  Soft, nontender Pelvic: SSE:  Scant vaginal discharge  without odor.  Wet prep negative, few lactobacilli. Strings not visible.  Bedside US shows fundal IUD Musculoskeletal:  Normal range of motion without pain Extremities:  No edema  No results found for this or any previous visit (from the past 24 hour(s)).        Assessment & Plan:  A:   Normal vaginal discharge, maybe lacking some lactobacilli  Normally placed IUD P:  Vaginal probiotic if desires  cytotec  Before IUD removal Return if symptoms worsen or fail to improve.   CRESENZO-DISHMAN,Nyeshia Mysliwiec CNM 12/25/2015 2:55 PM

## 2015-12-25 NOTE — Patient Instructions (Addendum)
     UP4 ADULT Superior, compatible and safe strains DDS  -1 L.acidophilus (super strain) with B. Longum, B.Bifidum & B.Infantis Acid-resistant-survives stomach acid and Bile salts   Fortified with prebiotic Fructooligosaccharide to enhance growth and performance Potency: 15 Billion CFU/capsule Dosage: 1 capsule daily, best before a meal.  Amazon:  $21.90 for 60 caps   iF THERE IS STILL A PROBLEM ADD,   FLORAJEN ACIDOPHILUS High Potency Acidophilus Florajen high potency acidophilus is especially effective for restoring and maintaining a healthy, comfortable balance of vaginal flora. It is also beneficial for overall intestinal health and maintaining the immune system. Garvin cultures per capsule Available in 30 and 60 capsules   Amazon:  $19.99 for 60 caps

## 2016-02-06 ENCOUNTER — Other Ambulatory Visit (HOSPITAL_COMMUNITY): Payer: Self-pay | Admitting: Registered Nurse

## 2016-02-06 DIAGNOSIS — Z1231 Encounter for screening mammogram for malignant neoplasm of breast: Secondary | ICD-10-CM

## 2016-02-17 ENCOUNTER — Ambulatory Visit (HOSPITAL_COMMUNITY): Payer: Self-pay

## 2016-10-05 ENCOUNTER — Encounter: Payer: Self-pay | Admitting: Family Medicine

## 2016-10-05 ENCOUNTER — Ambulatory Visit (INDEPENDENT_AMBULATORY_CARE_PROVIDER_SITE_OTHER): Payer: BC Managed Care – PPO | Admitting: Family Medicine

## 2016-10-05 VITALS — BP 122/80 | HR 80 | Temp 98.4°F | Resp 18 | Ht 64.0 in | Wt 173.0 lb

## 2016-10-05 DIAGNOSIS — Z833 Family history of diabetes mellitus: Secondary | ICD-10-CM | POA: Diagnosis not present

## 2016-10-05 DIAGNOSIS — Z975 Presence of (intrauterine) contraceptive device: Secondary | ICD-10-CM | POA: Diagnosis not present

## 2016-10-05 DIAGNOSIS — R600 Localized edema: Secondary | ICD-10-CM | POA: Insufficient documentation

## 2016-10-05 DIAGNOSIS — Z8249 Family history of ischemic heart disease and other diseases of the circulatory system: Secondary | ICD-10-CM

## 2016-10-05 HISTORY — DX: Localized edema: R60.0

## 2016-10-05 HISTORY — DX: Presence of (intrauterine) contraceptive device: Z97.5

## 2016-10-05 LAB — CBC
HCT: 40.3 % (ref 35.0–45.0)
Hemoglobin: 13.2 g/dL (ref 11.7–15.5)
MCH: 27.4 pg (ref 27.0–33.0)
MCHC: 32.8 g/dL (ref 32.0–36.0)
MCV: 83.6 fL (ref 80.0–100.0)
MPV: 10.2 fL (ref 7.5–12.5)
PLATELETS: 258 10*3/uL (ref 140–400)
RBC: 4.82 MIL/uL (ref 3.80–5.10)
RDW: 13.2 % (ref 11.0–15.0)
WBC: 6.9 10*3/uL (ref 3.8–10.8)

## 2016-10-05 NOTE — Progress Notes (Signed)
Chief Complaint  Patient presents with  . Hypertension   Here for first appointment History of elevated BP but never treated for hypertension Sees GYN for regular care due to IUD No complaints today Is due for lab screening due to family history On no prescription medicine No recent problems with migraine  Patient Active Problem List   Diagnosis Date Noted  . IUD contraception 10/05/2016  . Pedal edema 10/05/2016  . MIGRAINES, HX OF 06/11/2008    Outpatient Encounter Prescriptions as of 10/05/2016  Medication Sig  . levonorgestrel (MIRENA) 20 MCG/24HR IUD 1 each by Intrauterine route once.   No facility-administered encounter medications on file as of 10/05/2016.     No past medical history on file.  Past Surgical History:  Procedure Laterality Date  . CESAREAN SECTION     2000,2003,2005  . CESAREAN SECTION  08/17/1998    Social History   Social History  . Marital status: Married    Spouse name: Harrell Gave  . Number of children: 3  . Years of education: 42   Occupational History  . Artist     state of [place]   Social History Main Topics  . Smoking status: Never Smoker  . Smokeless tobacco: Never Used  . Alcohol use Yes     Comment: occasional  . Drug use: No  . Sexual activity: Yes    Birth control/ protection: IUD   Other Topics Concern  . 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 Heart failure Father   . Alcohol abuse Father   . Heart disease Father 17       died at 74  . Hypertension Father   . Stroke Mother   . Hypertension Mother   . Arthritis Mother   . Asthma Mother   . Diabetes Mother   . Hyperlipidemia Mother   . Glaucoma Mother   . Breast cancer Maternal Aunt   . Colon cancer Maternal Aunt   . Stroke Maternal Aunt   . Glaucoma Sister   . Asthma Brother     Review of Systems    Constitutional: Negative for chills, fever and weight loss.  HENT: Negative for congestion and hearing loss.   Eyes: Negative for blurred vision and pain.  Respiratory: Negative for cough and shortness of breath.   Cardiovascular: Positive for leg swelling. Negative for chest pain.  Gastrointestinal: Negative for abdominal pain, constipation, diarrhea and heartburn.  Genitourinary: Negative for dysuria and frequency.  Musculoskeletal: Negative for falls, joint pain and myalgias.  Neurological: Negative for dizziness, seizures and headaches.  Psychiatric/Behavioral: Negative for depression. The patient is not nervous/anxious and does not have insomnia.     BP 122/80 (BP Location: Right Arm, Patient Position: Sitting, Cuff Size: Normal)   Pulse 80   Temp 98.4 F (36.9 C) (Temporal)   Resp 18   Ht 5\' 4"  (1.626 m)   Wt 173 lb 0.6 oz (78.5 kg)   LMP 09/20/2016 (Exact Date)   SpO2 98%   BMI 29.70 kg/m   Physical Exam  Constitutional: She is oriented to person, place, and time. She appears well-developed and well-nourished.  HENT:  Head: Normocephalic and atraumatic.  Mouth/Throat: Oropharynx is clear and moist.  Eyes: Conjunctivae are normal. Pupils are equal, round, and reactive to light.  Neck: Normal range  of motion. Neck supple. No thyromegaly present.  Cardiovascular: Normal rate, regular rhythm and normal heart sounds.   Pulmonary/Chest: Effort normal and breath sounds normal. No respiratory distress.  Abdominal: Soft. Bowel sounds are normal.  Musculoskeletal: Normal range of motion. She exhibits edema.  trace  Lymphadenopathy:    She has no cervical adenopathy.  Neurological: She is alert and oriented to person, place, and time.  Gait normal  Skin: Skin is warm and dry.  Psychiatric: She has a normal mood and affect. Her behavior is normal. Thought content normal.  Nursing note and vitals reviewed.  ASSESSMENT/PLAN:  1. IUD contraception  2. Pedal edema - CBC -  COMPLETE METABOLIC PANEL WITH GFR - Lipid panel - VITAMIN D 25 Hydroxy (Vit-D Deficiency, Fractures) - Urinalysis, Routine w reflex microscopic  3. Family history of diabetes mellitus  4. Family history of CHF (congestive heart failure)    Patient Instructions  Need records [facility]  Need blood work  Go back to Dr [employer] office for the IUD care  See me annually / as needed   Raylene Everts, MD

## 2016-10-05 NOTE — Patient Instructions (Addendum)
Need records [facility]  Need blood work  Go back to Dr Parker [employer] office for the IUD care  See me annually / as needed

## 2016-10-06 ENCOUNTER — Ambulatory Visit: Payer: Self-pay | Admitting: Family Medicine

## 2016-10-06 ENCOUNTER — Encounter: Payer: Self-pay | Admitting: Family Medicine

## 2016-10-06 LAB — URINALYSIS, ROUTINE W REFLEX MICROSCOPIC
BILIRUBIN URINE: NEGATIVE
GLUCOSE, UA: NEGATIVE
HGB URINE DIPSTICK: NEGATIVE
KETONES UR: NEGATIVE
Leukocytes, UA: NEGATIVE
NITRITE: NEGATIVE
PH: 6.5 (ref 5.0–8.0)
Protein, ur: NEGATIVE
Specific Gravity, Urine: 1.006 (ref 1.001–1.035)

## 2016-10-06 LAB — COMPLETE METABOLIC PANEL WITH GFR
ALT: 16 U/L (ref 6–29)
AST: 14 U/L (ref 10–30)
Albumin: 4.1 g/dL (ref 3.6–5.1)
Alkaline Phosphatase: 57 U/L (ref 33–115)
BUN: 8 mg/dL (ref 7–25)
CHLORIDE: 103 mmol/L (ref 98–110)
CO2: 23 mmol/L (ref 20–31)
CREATININE: 0.75 mg/dL (ref 0.50–1.10)
Calcium: 9.3 mg/dL (ref 8.6–10.2)
GFR, Est Non African American: >89 mL/min (ref >=60)
Glucose, Bld: 89 mg/dL (ref 65–99)
Potassium: 3.8 mmol/L (ref 3.5–5.3)
Sodium: 136 mmol/L (ref 135–146)
Total Bilirubin: 0.6 mg/dL (ref 0.2–1.2)
Total Protein: 6.7 g/dL (ref 6.1–8.1)

## 2016-10-06 LAB — LIPID PANEL
Cholesterol: 151 mg/dL (ref <200)
HDL: 36 mg/dL — ABNORMAL LOW (ref >50)
LDL CALC: 86 mg/dL (ref <100)
TRIGLYCERIDES: 147 mg/dL (ref <150)
Total CHOL/HDL Ratio: 4.2 Ratio (ref <5.0)
VLDL: 29 mg/dL (ref <30)

## 2016-10-06 LAB — VITAMIN D 25 HYDROXY (VIT D DEFICIENCY, FRACTURES): Vit D, 25-Hydroxy: 15 ng/mL — ABNORMAL LOW (ref 30–100)

## 2017-01-28 ENCOUNTER — Ambulatory Visit (INDEPENDENT_AMBULATORY_CARE_PROVIDER_SITE_OTHER): Payer: BC Managed Care – PPO | Admitting: Advanced Practice Midwife

## 2017-01-28 ENCOUNTER — Encounter: Payer: Self-pay | Admitting: Advanced Practice Midwife

## 2017-01-28 VITALS — BP 142/90 | HR 81 | Ht 64.0 in | Wt 175.0 lb

## 2017-01-28 DIAGNOSIS — Z3202 Encounter for pregnancy test, result negative: Secondary | ICD-10-CM | POA: Diagnosis not present

## 2017-01-28 DIAGNOSIS — Z30430 Encounter for insertion of intrauterine contraceptive device: Secondary | ICD-10-CM

## 2017-01-28 DIAGNOSIS — Z30432 Encounter for removal of intrauterine contraceptive device: Secondary | ICD-10-CM | POA: Diagnosis not present

## 2017-01-28 LAB — POCT URINE PREGNANCY: Preg Test, Ur: NEGATIVE

## 2017-01-28 MED ORDER — LEVONORGESTREL 20 MCG/24HR IU IUD
INTRAUTERINE_SYSTEM | Freq: Once | INTRAUTERINE | Status: AC
  Administered 2017-01-28: 17:00:00 via INTRAUTERINE
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
## 2017-02-24 ENCOUNTER — Ambulatory Visit: Payer: BC Managed Care – PPO | Admitting: Obstetrics and Gynecology

## 2017-02-24 ENCOUNTER — Encounter: Payer: Self-pay | Admitting: Obstetrics and Gynecology

## 2017-02-24 ENCOUNTER — Other Ambulatory Visit: Payer: Self-pay | Admitting: Obstetrics and Gynecology

## 2017-02-24 VITALS — BP 142/80 | HR 87 | Ht 64.0 in | Wt 172.6 lb

## 2017-02-24 DIAGNOSIS — D219 Benign neoplasm of connective and other soft tissue, unspecified: Secondary | ICD-10-CM

## 2017-02-24 DIAGNOSIS — Z3202 Encounter for pregnancy test, result negative: Secondary | ICD-10-CM

## 2017-02-24 LAB — POCT URINE PREGNANCY: Preg Test, Ur: NEGATIVE

## 2017-02-24 NOTE — Progress Notes (Signed)
Patient ID: Margaret Owen, female   DOB: 10-May-1975, 41 y.o.   MRN: [id number]   Pt has an IUD in place. She was seen on 01/28/2017 for removal and insertion of her Mirena IUD. At that visit, 4 dark areas were noticed on her cervix and it was suggested she return for a biopsy.   Exam: Transvaginal U/S showed left intramural fibroid, 7.8 cm  PROCEDURE: Cervical biopsy taken on interior lip of cervix.    A:  1. Cervical Lesion, hyperpigmented   P: 1. Will call with her results Results benign pigmented lesion of cervix.  By signing my name below, I, Margit Banda, attest that this documentation has been prepared under the direction and in the presence of Jonnie Kind, MD. Electronically Signed: Margit Banda, Medical Scribe. 02/24/17. 2:29 PM.  I personally performed the services described in this documentation, which was SCRIBED in my presence. The recorded information has been reviewed and considered accurate. It has been edited as necessary during review. Jonnie Kind, MD

## 2017-02-24 NOTE — Progress Notes (Signed)
See above note

## 2017-02-28 ENCOUNTER — Telehealth: Payer: Self-pay | Admitting: Obstetrics and Gynecology

## 2017-02-28 NOTE — Telephone Encounter (Signed)
Unable to speak to pt about results , not at home at time of call.

## 2017-03-08 ENCOUNTER — Telehealth: Payer: Self-pay | Admitting: *Deleted

## 2017-03-08 NOTE — Telephone Encounter (Signed)
Attempted to call pt to notify her of results. Pt unavailable.

## 2017-03-08 NOTE — Telephone Encounter (Signed)
Pt notified of benign pigmented lesions of cervix

## 2017-03-22 ENCOUNTER — Ambulatory Visit: Payer: BC Managed Care – PPO | Admitting: Obstetrics and Gynecology

## 2017-03-22 ENCOUNTER — Encounter: Payer: Self-pay | Admitting: Obstetrics and Gynecology

## 2017-03-22 DIAGNOSIS — N921 Excessive and frequent menstruation with irregular cycle: Secondary | ICD-10-CM | POA: Insufficient documentation

## 2017-03-22 DIAGNOSIS — Z975 Presence of (intrauterine) contraceptive device: Secondary | ICD-10-CM | POA: Insufficient documentation

## 2017-03-22 HISTORY — DX: Excessive and frequent menstruation with irregular cycle: N92.1

## 2017-03-22 NOTE — Progress Notes (Signed)
   [facility] Visit  03/22/2017            Patient name: Margaret Owen MRN [id number]  Date of birth: [birth year]  CC & HPI:  Margaret Owen is a 41 y.o. female presenting today for abnormal vaginal bleeding, and gyn f/u after IUD reinsertion. Associated symptoms not noted. No alleviating factors noted. She has had non-stop vaginal bleeding after having the IUD inserted. At night, when pt is laying down, she feels a 'gush' of blood that is constant throughout the night. She has a hx of fibroid (7 cm), and is concerned it may have been punctured by the IUD. She has not had any ultrasounds recently. She is using the IUD as birth control. She states her periods were typically light before her IUD use. Her husband is able to feel her IUD strings, and states it is uncomfortable  Previous IUD was removed and new Mirena inserted on 01/28/17 by Christin Fudge. Pt has one sister who has fibroids. She states her husband may be willing to have a vasectomy.  ROS:  ROS (+) heavy vaginal bleeding (-) nausea  All systems are negative except as noted in the HPI and PMH.   Pertinent History Reviewed:   Reviewed: Significant for fibroid, [facility]        No past medical history on file.                            Surgical Hx:    Past Surgical History:  Procedure Laterality Date  . CESAREAN SECTION     2000,2003,2005  . CESAREAN SECTION  08/17/1998   Medications: Reviewed & Updated - see associated section                       Current Outpatient Medications:  .  levonorgestrel (MIRENA) 20 MCG/24HR IUD, 1 each by Intrauterine route once., Disp: , Rfl:    Social History: Reviewed -  reports that  has never smoked. she has never used smokeless tobacco.  Objective Findings:  Vitals: Blood pressure 136/72, height 5\' 4"  (1.626 m), weight 174 lb 6.4 oz (79.1 kg).  Physical Examination: General appearance - alert, well appearing, and in no distress Mental status -  alert, oriented to person, place, and time Pelvic - Not indicated  Assessment & Plan:   A:  1. AUB related with IUD,, recently status post IUD replacement, fundal uterine fibroid at 7 cm  P:  1. Schedule transvaginal ultrasound in 1 week and gyn f/u to discuss results    By signing my name below, I, Izna Ahmed, attest that this documentation has been prepared under the direction and in the presence of Jonnie Kind, MD. Electronically Signed: Jabier Gauss, Medical Scribe. 03/22/17. 3:38 PM.  I personally performed the services described in this documentation, which was SCRIBED in my presence. The recorded information has been reviewed and considered accurate. It has been edited as necessary during review. Jonnie Kind, MD

## 2017-03-26 ENCOUNTER — Other Ambulatory Visit: Payer: Self-pay | Admitting: Obstetrics and Gynecology

## 2017-03-26 DIAGNOSIS — D219 Benign neoplasm of connective and other soft tissue, unspecified: Secondary | ICD-10-CM

## 2017-03-26 DIAGNOSIS — N939 Abnormal uterine and vaginal bleeding, unspecified: Secondary | ICD-10-CM

## 2017-03-29 ENCOUNTER — Other Ambulatory Visit: Payer: BC Managed Care – PPO

## 2017-03-31 ENCOUNTER — Ambulatory Visit (INDEPENDENT_AMBULATORY_CARE_PROVIDER_SITE_OTHER): Payer: BC Managed Care – PPO

## 2017-03-31 DIAGNOSIS — N939 Abnormal uterine and vaginal bleeding, unspecified: Secondary | ICD-10-CM

## 2017-03-31 DIAGNOSIS — D219 Benign neoplasm of connective and other soft tissue, unspecified: Secondary | ICD-10-CM | POA: Diagnosis not present

## 2017-03-31 DIAGNOSIS — T8332XA Displacement of intrauterine contraceptive device, initial encounter: Secondary | ICD-10-CM

## 2017-03-31 NOTE — Progress Notes (Signed)
PELVIC US TA/TV: heterogeneous anteverted uterus w/a subserosal fibroid,fundal-mid left uterus 5.7 x 5.3 x 4.8 cm,fibroid appears to be distorting the endometrium,EEC 9.5 mm,IUD is located in the LUS,normal ovaries bilat,no free fluid,no pain during ultrasound,ovaries appear mobile

## 2017-04-14 ENCOUNTER — Ambulatory Visit: Payer: BC Managed Care – PPO | Admitting: Obstetrics and Gynecology

## 2017-04-14 ENCOUNTER — Encounter: Payer: Self-pay | Admitting: Obstetrics and Gynecology

## 2017-04-14 ENCOUNTER — Other Ambulatory Visit: Payer: Self-pay

## 2017-04-14 DIAGNOSIS — N939 Abnormal uterine and vaginal bleeding, unspecified: Secondary | ICD-10-CM

## 2017-04-14 DIAGNOSIS — T8332XA Displacement of intrauterine contraceptive device, initial encounter: Secondary | ICD-10-CM | POA: Insufficient documentation

## 2017-04-14 DIAGNOSIS — D250 Submucous leiomyoma of uterus: Secondary | ICD-10-CM | POA: Diagnosis not present

## 2017-04-14 DIAGNOSIS — Z30432 Encounter for removal of intrauterine contraceptive device: Secondary | ICD-10-CM | POA: Diagnosis not present

## 2017-04-14 HISTORY — DX: Displacement of intrauterine contraceptive device, initial encounter: T83.32XA

## 2017-04-14 MED ORDER — NORELGESTROMIN-ETH ESTRADIOL 150-35 MCG/24HR TD PTWK: 1.0000 | MEDICATED_PATCH | TRANSDERMAL | 12 refills | Status: DC

## 2017-04-14 NOTE — Assessment & Plan Note (Signed)
Switch to [facility]

## 2017-04-14 NOTE — Patient Instructions (Signed)
Hysteroscopy Hysteroscopy is a procedure used for looking inside the womb (uterus). It may be done for various reasons, including:  To evaluate abnormal bleeding, fibroid (benign, noncancerous) tumors, polyps, scar tissue (adhesions), and possibly cancer of the uterus.  To look for lumps (tumors) and other uterine growths.  To look for causes of why a woman cannot get pregnant (infertility), causes of recurrent loss of pregnancy (miscarriages), or a lost intrauterine device (IUD).  To perform a sterilization by blocking the fallopian tubes from inside the uterus.  In this procedure, a thin, flexible tube with a tiny light and camera on the end of it (hysteroscope) is used to look inside the uterus. A hysteroscopy should be done right after a menstrual period to be sure you are not pregnant. LET [facility] CARE PROVIDER KNOW ABOUT:  Any allergies you have.  All medicines you are taking, including vitamins, herbs, eye drops, creams, and over-the-counter medicines.  Previous problems you or members of your family have had with the use of anesthetics.  Any blood disorders you have.  Previous surgeries you have had.  Medical conditions you have. RISKS AND COMPLICATIONS Generally, this is a safe procedure. However, as with any procedure, complications can occur. Possible complications include:  Putting a hole in the uterus.  Excessive bleeding.  Infection.  Damage to the cervix.  Injury to other organs.  Allergic reaction to medicines.  Too much fluid used in the uterus for the procedure.  BEFORE THE PROCEDURE  Ask your health care provider about changing or stopping any regular medicines.  Do not take aspirin or blood thinners for 1 week before the procedure, or as directed by your health care provider. These can cause bleeding.  If you smoke, do not smoke for 2 weeks before the procedure.  In some cases, a medicine is placed in the cervix the day before the procedure.  This medicine makes the cervix have a larger opening (dilate). This makes it easier for the instrument to be inserted into the uterus during the procedure.  Do not eat or drink anything for at least 8 hours before the surgery.  Arrange for someone to take you home after the procedure. PROCEDURE  You may be given a medicine to relax you (sedative). You may also be given one of the following: ? A medicine that numbs the area around the cervix (local anesthetic). ? A medicine that makes you sleep through the procedure (general anesthetic).  The hysteroscope is inserted through the vagina into the uterus. The camera on the hysteroscope sends a picture to a TV screen. This gives the surgeon a good view inside the uterus.  During the procedure, air or a liquid is put into the uterus, which allows the surgeon to see better.  Sometimes, tissue is gently scraped from inside the uterus. These tissue samples are sent to a lab for testing. What to expect after the procedure  If you had a general anesthetic, you may be groggy for a couple hours after the procedure.  If you had a local anesthetic, you will be able to go home as soon as you are stable and feel ready.  You may have some cramping. This normally lasts for a couple days.  You may have bleeding, which varies from light spotting for a few days to menstrual-like bleeding for 3-7 days. This is normal.  If your test results are not back during the visit, make an appointment with your health care provider to find out the  results. This information is not intended to replace advice given to you by your health care provider. Make sure you discuss any questions you have with your health care provider. Document Released: 07/13/2000 Document Revised: 09/12/2015 Document Reviewed: 11/03/2012 Elsevier Interactive Patient Education  2017 [employer] Uterine Fibroids Uterine fibroids are tissue masses (tumors) that can develop in the womb (uterus).  They are also called leiomyomas. This type of tumor is not cancerous (benign) and does not spread to other parts of the body outside of the pelvic area, which is between the hip bones. Occasionally, fibroids may develop in the fallopian tubes, in the cervix, or on the support structures (ligaments) that surround the uterus. You can have one or many fibroids. Fibroids can vary in size, weight, and where they grow in the uterus. Some can become quite large. Most fibroids do not require medical treatment. What are the causes? A fibroid can develop when a single uterine cell keeps growing (replicating). Most cells in the human body have a control mechanism that keeps them from replicating without control. What are the signs or symptoms? Symptoms may include:  Heavy bleeding during your period.  Bleeding or spotting between periods.  Pelvic pain and pressure.  Bladder problems, such as needing to urinate more often (urinary frequency) or urgently.  Inability to reproduce offspring (infertility).  Miscarriages.  How is this diagnosed? Uterine fibroids are diagnosed through a physical exam. Your health care provider may feel the lumpy tumors during a pelvic exam. Ultrasonography and an MRI may be done to determine the size, location, and number of fibroids. How is this treated? Treatment may include:  Watchful waiting. This involves getting the fibroid checked by your health care provider to see if it grows or shrinks. Follow your health care provider's recommendations for how often to have this checked.  Hormone medicines. These can be taken by mouth or given through an intrauterine device (IUD).  Surgery. ? Removing the fibroids (myomectomy) or the uterus (hysterectomy). ? Removing blood supply to the fibroids (uterine artery embolization).  If fibroids interfere with your fertility and you want to become pregnant, your health care provider may recommend having the fibroids  removed. Follow these instructions at home:  Keep all follow-up visits as directed by your health care provider. This is important.  Take over-the-counter and prescription medicines only as told by your health care provider. ? If you were prescribed a hormone treatment, take the hormone medicines exactly as directed.  Ask your health care provider about taking iron pills and increasing the amount of dark green, leafy vegetables in your diet. These actions can help to boost your blood iron levels, which may be affected by heavy menstrual bleeding.  Pay close attention to your period and tell your health care provider about any changes, such as: ? Increased blood flow that requires you to use more pads or tampons than usual per month. ? A change in the number of days that your period lasts per month. ? A change in symptoms that are associated with your period, such as abdominal cramping or back pain. Contact a health care provider if:  You have pelvic pain, back pain, or abdominal cramps that cannot be controlled with medicines.  You have an increase in bleeding between and during periods.  You soak tampons or pads in a half hour or less.  You feel lightheaded, extra tired, or weak. Get help right away if:  You faint.  You have a sudden increase in pelvic  pain. This information is not intended to replace advice given to you by your health care provider. Make sure you discuss any questions you have with your health care provider. Document Released: 04/03/2000 Document Revised: 12/05/2015 Document Reviewed: 10/03/2013 Elsevier Interactive Patient Education  2018 [employer]. Ethinyl Estradiol; Norelgestromin skin patches What is this medicine? ETHINYL ESTRADIOL;NORELGESTROMIN (ETH in il es tra DYE ole; nor el JES troe min) skin patch is used as a contraceptive (birth control method). This medicine combines two types of female hormones, an estrogen and a progestin. This patch is used to  prevent ovulation and pregnancy. This medicine may be used for other purposes; ask your health care provider or pharmacist if you have questions. COMMON BRAND NAME(S): Ortho Becky Sax What should I tell my health care provider before I take this medicine? They need to know if you have or ever had any of these conditions: -abnormal vaginal bleeding -blood vessel disease or blood clots -breast, cervical, endometrial, ovarian, liver, or uterine cancer -diabetes -gallbladder disease -heart disease or recent heart attack -high blood pressure -high cholesterol -kidney disease -liver disease -migraine headaches -stroke -systemic lupus erythematosus (SLE) -tobacco smoker -an unusual or allergic reaction to estrogens, progestins, other medicines, foods, dyes, or preservatives -pregnant or trying to get pregnant -breast-feeding How should I use this medicine? This patch is applied to the skin. Follow the directions on the prescription label. Apply to clean, dry, healthy skin on the buttock, abdomen, upper outer arm or upper torso, in a place where it will not be rubbed by tight clothing. Do not use lotions or other cosmetics on the site where the patch will go. Press the patch firmly in place for 10 seconds to ensure good contact with the skin. Change the patch every 7 days on the same day of the week for 3 weeks. You will then have a break from the patch for 1 week, after which you will apply a new patch. Do not use your medicine more often than directed. Contact your pediatrician regarding the use of this medicine in children. Special care may be needed. This medicine has been used in female children who have started having menstrual periods. A patient package insert for the product will be given with each prescription and refill. Read this sheet carefully each time. The sheet may change frequently. Overdosage: If you think you have taken too much of this medicine contact a poison control center  or emergency room at once. NOTE: This medicine is only for you. Do not share this medicine with others. What if I miss a dose? You will need to replace your patch once a week as directed. If your patch is lost or falls off, contact your health care professional for advice. You may need to use another form of birth control if your patch has been off for more than 1 day. What may interact with this medicine? Do not take this medicine with the following medication: -dasabuvir; ombitasvir; paritaprevir; ritonavir -ombitasvir; paritaprevir; ritonavir This medicine may also interact with the following medications: -acetaminophen -antibiotics or medicines for infections, especially rifampin, rifabutin, rifapentine, and griseofulvin, and possibly penicillins or tetracyclines -aprepitant -ascorbic acid (vitamin C) -atorvastatin -barbiturate medicines, such as phenobarbital -bosentan -carbamazepine -caffeine -clofibrate -cyclosporine -dantrolene -doxercalciferol -felbamate -grapefruit juice -hydrocortisone -medicines for anxiety or sleeping problems, such as diazepam or temazepam -medicines for diabetes, including pioglitazone -modafinil -mycophenolate -nefazodone -oxcarbazepine -phenytoin -prednisolone -ritonavir or other medicines for HIV infection or AIDS -rosuvastatin -selegiline -soy isoflavones supplements -St. Noha Milberger's wort -  tamoxifen or raloxifene -theophylline -thyroid hormones -topiramate -warfarin This list may not describe all possible interactions. Give your health care provider a list of all the medicines, herbs, non-prescription drugs, or dietary supplements you use. Also tell them if you smoke, drink alcohol, or use illegal drugs. Some items may interact with your medicine. What should I watch for while using this medicine? Visit your doctor or health care professional for regular checks on your progress. You will need a regular breast and pelvic exam and Pap smear  while on this medicine. Use an additional method of contraception during the first cycle that you use this patch. If you have any reason to think you are pregnant, stop using this medicine right away and contact your doctor or health care professional. If you are using this medicine for hormone related problems, it may take several cycles of use to see improvement in your condition. Smoking increases the risk of getting a blood clot or having a stroke while you are using hormonal birth control, especially if you are more than 41 years old. You are strongly advised not to smoke. This medicine can make your body retain fluid, making your fingers, hands, or ankles swell. Your blood pressure can go up. Contact your doctor or health care professional if you feel you are retaining fluid. This medicine can make you more sensitive to the sun. Keep out of the sun. If you cannot avoid being in the sun, wear protective clothing and use sunscreen. Do not use sun lamps or tanning beds/booths. If you wear contact lenses and notice visual changes, or if the lenses begin to feel uncomfortable, consult your eye care specialist. In some women, tenderness, swelling, or minor bleeding of the gums may occur. Notify your dentist if this happens. Brushing and flossing your teeth regularly may help limit this. See your dentist regularly and inform your dentist of the medicines you are taking. If you are going to have elective surgery or a MRI, you may need to stop using this medicine before the surgery or MRI. Consult your health care professional for advice. This medicine does not protect you against HIV infection (AIDS) or any other sexually transmitted diseases. What side effects may I notice from receiving this medicine? Side effects that you should report to your doctor or health care professional as soon as possible: -breast tissue changes or discharge -changes in vaginal bleeding during your period or between your  periods -chest pain -coughing up blood -dizziness or fainting spells -headaches or migraines -leg, arm or groin pain -severe or sudden headaches -stomach pain (severe) -sudden shortness of breath -sudden loss of coordination, especially on one side of the body -speech problems -symptoms of vaginal infection like itching, irritation or unusual discharge -tenderness in the upper abdomen -vomiting -weakness or numbness in the arms or legs, especially on one side of the body -yellowing of the eyes or skin Side effects that usually do not require medical attention (report to your doctor or health care professional if they continue or are bothersome): -breakthrough bleeding and spotting that continues beyond the 3 initial cycles of pills -breast tenderness -mood changes, anxiety, depression, frustration, anger, or emotional outbursts -increased sensitivity to sun or ultraviolet light -nausea -skin rash, acne, or brown spots on the skin -weight gain (slight) This list may not describe all possible side effects. Call your doctor for medical advice about side effects. You may report side effects to FDA at 1-800-FDA-1088. Where should I keep my medicine? Keep out of  the reach of children. Store at room temperature between 15 and 30 degrees C (59 and 86 degrees F). Keep the patch in its pouch until time of use. Throw away any unused medicine after the expiration date. Dispose of used patches properly. Since a used patch may still contain active hormones, fold the patch in half so that it sticks to itself prior to disposal. Throw away in a place where children or pets cannot reach. NOTE: This sheet is a summary. It may not cover all possible information. If you have questions about this medicine, talk to your doctor, pharmacist, or health care provider.  2018 Elsevier/Gold Standard (2015-12-16 07:59:03)

## 2017-04-14 NOTE — Progress Notes (Signed)
[facility] Visit  04/14/2017            Patient name: Margaret Owen MRN [id number]  Date of birth: 1976/03/31  CC & HPI:  Margaret Owen is a 41 y.o. female presenting today for f/u appointment after transvaginal U/S on 03/31/17, after AUB related with IUD. Pt was last seen in office on 03/22/17 for non-stop vaginal bleeding after IUD insertion on 01/28/17. She was concerned her fundal uterine fibroid at 7 cm may have been punctured. She has no complaints today. Her period began last night and she states it fels like a normal flow. Her non-stop bleeding had stopped before her office visit on 03/22/17. She does not want any more babies.  ROS:  ROS  - fever - chills All systems are negative except as noted in the HPI and PMH.   Pertinent History Reviewed:   Reviewed: Significant for uterine fibroid, C/S Medical        History reviewed. No pertinent past medical history.                            Surgical Hx:    Past Surgical History:  Procedure Laterality Date  . CESAREAN SECTION     2000,2003,2005  . CESAREAN SECTION  08/17/1998   Medications: Reviewed & Updated - see associated section                       Current Outpatient Medications:  .  levonorgestrel (MIRENA) 20 MCG/24HR IUD, 1 each by Intrauterine route once., Disp: , Rfl:    Social History: Reviewed -  reports that  has never smoked. she has never used smokeless tobacco.  Objective Findings:  Vitals: Blood pressure 138/86, pulse 81, height 5\' 4"  (1.626 m), weight 173 lb (78.5 kg), last menstrual period 04/13/2017.  Physical Examination: General appearance - alert, well appearing, and in no distress Mental status - alert, oriented to person, place, and time Pelvic - examination not indicated U/s reviewed. With pt. Large fibroid is  Discussion: 1. Discussed with pt the results of her ultrasound. Pt told her  fibroids are located in the posterior wall of uterus. IUD placement is low. Chance of her  non-stop heavy bleeding returning discussed.  Hysteroscopic resection and removal procedure for fibroid also discussed. Hysterectomy option also discussed. Pt asked to have IUD removed today. Other options of birth control discussed (patch, ring, shots, low-dose pills). Pt is concerned about weight gain from birth control use. She is considering using the shot.  At end of discussion, pt had opportunity to ask questions and has no further questions at this time.   Specific discussion as noted above. Greater than 50% was spent in counseling and coordination of care with the patient.   Total time greater than: 30 minutes.    IUD Removal  Patient was in the dorsal lithotomy position, normal external genitalia was noted.  A speculum was placed in the patient's vagina, normal discharge was noted, no lesions. The cervix was visualized, no lesions, no abnormal discharge;  and the cervix was swabbed with Betadine using scopettes. The strings of the IUD were grasped and pulled using ring forceps.  The IUD was successfully removed in its entirety. IUD was halfway out of cervix.  Patient tolerated the procedure well.    Future contraception: Patch   Assessment & Plan:   A:  1. Misplaced IUD, removed 2. Submucosal  fibroid 3. AUB secondary to submucosal fribroid 4. Contraception management   P:  1. IUD removal 2. Discussed contraception , IUd, and fibroid management, pt selects Patch, Ortho Evra 3. f/u in 3 months to discuss options.  We have discussed today possibility of running a sonohysterogram to define the type of fibroid, then consider hysteroscopic resection of a type 0 fibroid, or supracervical hysterectomy through her old cesarean section scar.  If her menstrual control is optimal with Ortho Evra, will simply continue that with plans to use the Ortho Evra in a continuous fashion to avoid menses completely  The provider spent more than 45 minutes with the visit, including previsit review,  documentation and exam, with >50% was spent in counseling and coordination of care.  Visit  consisted of a lengthy discussion of ultrasound, examination including IUD removal, then further discussion in the office of treatment options and alternatives  By signing my name below, I, Izna Ahmed, attest that this documentation has been prepared under the direction and in the presence of Jonnie Kind, MD. Electronically Signed: Jabier Gauss, Medical Scribe. 04/14/17. 9:18 AM.  I personally performed the services described in this documentation, which was SCRIBED in my presence. The recorded information has been reviewed and considered accurate. It has been edited as necessary during review. Jonnie Kind, MD

## 2017-06-03 ENCOUNTER — Emergency Department (HOSPITAL_COMMUNITY)
Admission: EM | Admit: 2017-06-03 | Discharge: 2017-06-03 | Disposition: A | Discharge status: Home / Self Care | Payer: BC Managed Care – PPO | Attending: Emergency Medicine | Admitting: Emergency Medicine

## 2017-06-03 ENCOUNTER — Encounter (HOSPITAL_COMMUNITY): Payer: Self-pay | Admitting: Emergency Medicine

## 2017-06-03 ENCOUNTER — Other Ambulatory Visit: Payer: Self-pay

## 2017-06-03 DIAGNOSIS — Z043 Encounter for examination and observation following other accident: Secondary | ICD-10-CM | POA: Insufficient documentation

## 2017-06-03 NOTE — Discharge Instructions (Signed)
Alternate 600 mg of ibuprofen and 500-1000 mg of Tylenol every 3 hours as needed for pain. Do not exceed 4000 mg of Tylenol daily. Ice to areas of soreness for the next few days and then may move to heat. Do some gentle stretching throughout the day, especially during hot showers or baths. Take short frequent walks and avoid prolonged periods of sitting or laying. Expect to be sore for the next few day and follow up with primary care physician for recheck of ongoing symptoms but return to ER for emergent changing or worsening of symptoms such as severe headache that gets worse, altered mental status/behaving unusually, persistent vomiting, excessive drowsiness, numbness to the arms or legs, unsteady gait, or slurred speech. ° °
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
## 2017-06-28 ENCOUNTER — Encounter: Payer: Self-pay | Admitting: Family Medicine

## 2017-07-14 ENCOUNTER — Encounter: Payer: Self-pay | Admitting: Obstetrics and Gynecology

## 2017-07-14 ENCOUNTER — Ambulatory Visit: Payer: BC Managed Care – PPO | Admitting: Obstetrics and Gynecology

## 2017-07-14 VITALS — BP 154/80 | HR 87 | Ht 64.0 in | Wt 166.6 lb

## 2017-07-14 DIAGNOSIS — N920 Excessive and frequent menstruation with regular cycle: Secondary | ICD-10-CM

## 2017-07-14 DIAGNOSIS — D250 Submucous leiomyoma of uterus: Secondary | ICD-10-CM | POA: Diagnosis not present

## 2017-07-14 DIAGNOSIS — R42 Dizziness and giddiness: Secondary | ICD-10-CM | POA: Diagnosis not present

## 2017-07-14 DIAGNOSIS — D251 Intramural leiomyoma of uterus: Secondary | ICD-10-CM

## 2017-07-14 NOTE — Patient Instructions (Signed)
Uterine Artery Embolization for Fibroids Uterine artery embolization is a nonsurgical treatment to shrink fibroids. A thin plastic tube (catheter) is used to inject material that blocks off the blood supply to the fibroid, which causes the fibroid to shrink. Tell a health care provider about:  Any allergies you have.  All medicines you are taking, including vitamins, herbs, eye drops, creams, and over-the-counter medicines.  Any problems you or family members have had with anesthetic medicines.  Any blood disorders you have.  Any surgeries you have had.  Any medical conditions you have. What are the risks?  Injury to the uterus from decreased blood supply  Infection.  Blood infection (septicemia).  Lack of menstrual periods (amenorrhea).  Death of tissue cells (necrosis) around your bladder or vulva.  Development of a hole between organs or from an organ to the surface of your skin (fistula).  Blood clot in the legs (deep vein thrombosis) or lung (pulmonary embolus). What happens before the procedure?  Ask your health care provider about changing or stopping your regular medicines.  Do not take aspirin or blood thinners (anticoagulants) for 1 week before the surgery or as directed by your health care provider.  Do not eat or drink anything for 8 hours before the surgery or as directed by your health care provider.  Empty your bladder before the procedure begins. What happens during the procedure?  An IV tube will be placed into one of your veins. This will be used to give you a sedative and pain medication (conscious sedation).  You will be given a medicine that numbs the area (local anesthetic).  A small cut will be made in your groin. A catheter is then inserted into the main artery of your leg.  The catheter will be guided through the artery to your uterus. A series of images will be taken while dye is injected through the catheter in your groin. X-rays are taken at  the same time. This is done to provide a road map of the blood supply to your uterus and fibroids.  Tiny plastic spheres, about the size of sand grains, will be injected through the catheter. Metal coils may be used to help block the artery. The particles will lodge in tiny branches of the uterine artery that supplies blood to the fibroids.  The procedure is repeated on the artery that supplies the other side of the uterus.  The catheter is then removed and pressure is held to stop any bleeding. No stitches are needed.  A dressing is then placed over the cut (incision). What happens after the procedure?  You will be taken to a recovery area where your progress will be monitored until you are awake, stable, and taking fluids well. If there are no other problems, you will then be moved to a regular hospital room.  You will be observed overnight in the hospital.  You will have cramping that should be controlled with pain medication. This information is not intended to replace advice given to you by your health care provider. Make sure you discuss any questions you have with your health care provider. Document Released: 06/22/2005 Document Revised: 09/12/2015 Document Reviewed: 10/20/2012 Elsevier Interactive Patient Education  2018 [employer].    Tranexamic acid oral tablets What is this medicine? TRANEXAMIC ACID (TRAN ex AM ik AS id) slows down or stops blood clots from being broken down. This medicine is used to treat heavy monthly menstrual bleeding. This medicine may be used for other purposes; ask  your health care provider or pharmacist if you have questions. COMMON BRAND NAME(S): Cyklokapron, Lysteda What should I tell my health care provider before I take this medicine? They need to know if you have any of these conditions: -bleeding in the brain -blood clotting problems -kidney disease -vision problems -an unusual allergic reaction to tranexamic acid, other medicines, foods,  dyes, or preservatives -pregnant or trying to get pregnant -breast-feeding How should I use this medicine? Take this medicine by mouth with a glass of water. Follow the directions on the prescription label. Do not cut, crush, or chew this medicine. You can take it with or without food. If it upsets your stomach, take it with food. Take your medicine at regular intervals. Do not take it more often than directed. Do not stop taking except on your doctor's advice. Do not take this medicine until your period has started. Do not take it for more than 5 days in a row. Do not take this medicine when you do not have your period. Talk to your pediatrician regarding the use of this medicine in children. While this drug may be prescribed for female children as young as 75 years of age for selected conditions, precautions do apply. Overdosage: If you think you have taken too much of this medicine contact a poison control center or emergency room at once. NOTE: This medicine is only for you. Do not share this medicine with others. What if I miss a dose? If you miss a dose, take it when you remember, and then take your next dose at least 6 hours later. Do not take more than 2 tablets at a time to make up for missed doses. What may interact with this medicine? Do not take this medicine with any of the following medications: -estrogens -birth control pills, patches, injections, rings or other devices that contain both an estrogen and a progestin This medicine may also interact with the following medications: -certain medicines used to help your blood clot -tretinoin (taken by mouth) This list may not describe all possible interactions. Give your health care provider a list of all the medicines, herbs, non-prescription drugs, or dietary supplements you use. Also tell them if you smoke, drink alcohol, or use illegal drugs. Some items may interact with your medicine. What should I watch for while using this  medicine? Tell your doctor or healthcare professional if your symptoms do not start to get better or if they get worse. Tell your doctor or healthcare professional if you notice any eye problems while taking this medicine. Your doctor will refer you to an eye doctor who will examine your eyes. What side effects may I notice from receiving this medicine? Side effects that you should report to your doctor or health care professional as soon as possible: -allergic reactions like skin rash, itching or hives, swelling of the face, lips, or tongue -breathing difficulties -changes in vision -sudden or severe pain in the chest, legs, head, or groin -unusually weak or tired Side effects that usually do not require medical attention (report to your doctor or health care professional if they continue or are bothersome): -back pain -headache -muscle or joint aches -sinus and nasal problems -stomach pain -tiredness This list may not describe all possible side effects. Call your doctor for medical advice about side effects. You may report side effects to FDA at 1-800-FDA-1088. Where should I keep my medicine? Keep out of the reach of children. Store at room temperature between 15 and 30 degrees C (  59 and 86 degrees F). Throw away any unused medicine after the expiration date. NOTE: This sheet is a summary. It may not cover all possible information. If you have questions about this medicine, talk to your doctor, pharmacist, or health care provider.  2018 Elsevier/Gold Standard (2015-05-09 09:12:15)

## 2017-07-14 NOTE — Progress Notes (Signed)
[facility] Visit  07/14/2017            Patient name: Margaret Owen MRN [id number]  Date of birth: Jan 11, 1976  CC & HPI:  IKIA CINCOTTA is a 42 y.o. female presenting today for a follow-up to discuss options to manage her submucosal fibroid, including sonohysterogram, hysteroscopy, supracervical hysterectomy. She has been on the Ortho Evra patch since her last visit on 05/15/16. She states her flow is still moderately heavy for 3 days during the 7 day period when she is off the patch. She has associated symptoms of lightheadedness, and states this occurs at the same time she feels her flow occurring. She mentioned she had some breakthrough bleeding occurring before her period began. No alleviating factors noted. She has not tried any other medications for relief. Patient's last menstrual period was 06/30/2017 (approximate), and ended on the 22nd.    ROS:  ROS  (+) menorrhagia (+)lightheadedness All systems are negative except as noted in the HPI and PMH.    Pertinent History Reviewed:   Reviewed: Significant for c-section, uterine fibroid Medical        History reviewed. No pertinent past medical history.                            Surgical Hx:    Past Surgical History:  Procedure Laterality Date   CESAREAN SECTION     2000,2003,2005   CESAREAN SECTION  08/17/1998   Medications: Reviewed & Updated - see associated section                       Current Outpatient Medications:    norelgestromin-ethinyl estradiol (ORTHO EVRA) 150-35 MCG/24HR transdermal patch, Place 1 patch onto the skin once a week., Disp: 3 patch, Rfl: 12   Social History: Reviewed -  reports that she has never smoked. She has never used smokeless tobacco.  Objective Findings:  Vitals: Blood pressure (!) 154/80, pulse 87, height 5\' 4"  (1.626 m), weight 166 lb 9.6 oz (75.6 kg), last menstrual period 06/30/2017.  PHYSICAL EXAMINATION General appearance - alert, well appearing, and in no  distress, oriented to person, place, and time and normal appearing weight Mental status - alert, oriented to person, place, and time, normal mood, behavior, speech, dress, motor activity, and thought processes  PELVIC Not indicated  Discussion: 1. Discussed with pt risks and benefits of Lysteda, as an option to manage her heavy bleeding instead of considering surgical management. Further review of recent changes in guidelines, where to be on the safe side, usage needs to stop of tranexamic acid while on birth control, due to slightly increased risk of clots  2. The previous options discussed during her last visit were also talked over (hysterectomy, hysteroscopy, resection and removal). Embolization was also discussed as an option. Estradiol booster pill offered as well if she continues to breakthrough bleed/spot.   At end of discussion, pt had opportunity to ask questions and has no further questions at this time.   Specific discussion as noted above. Greater than 50% was spent in counseling and coordination of care with the patient.   Total time greater than: 25 minutes.    Assessment & Plan:   A:  1.  Uterine fibroids with menorrhagia, adequately controlled at present with patches   P: considered lysteda but it is contraindicated when pt is on  Combined OCP 1. Pt will contact if she  needs a booster pill for breakthrough bleeding, would add estradiol 1 mg 1-2 x /d with BTB 2. F/u in 1 year    By signing my name below, I, Izna Ahmed, attest that this documentation has been prepared under the direction and in the presence of Jonnie Kind, MD. Electronically Signed: Jabier Gauss, Medical Scribe. 07/14/17. 9:27 AM.  I personally performed the services described in this documentation, which was SCRIBED in my presence. The recorded information has been reviewed and considered accurate. It has been edited as necessary during review. Jonnie Kind, MD

## 2018-01-10 ENCOUNTER — Other Ambulatory Visit: Payer: BC Managed Care – PPO | Admitting: Obstetrics and Gynecology

## 2018-01-11 ENCOUNTER — Other Ambulatory Visit (HOSPITAL_COMMUNITY)
Admission: RE | Admit: 2018-01-11 | Discharge: 2018-01-11 | Disposition: A | Discharge status: Home / Self Care | Payer: BC Managed Care – PPO | Source: Ambulatory Visit | Attending: Obstetrics and Gynecology | Admitting: Obstetrics and Gynecology

## 2018-01-11 ENCOUNTER — Ambulatory Visit (INDEPENDENT_AMBULATORY_CARE_PROVIDER_SITE_OTHER): Payer: BC Managed Care – PPO | Admitting: Obstetrics and Gynecology

## 2018-01-11 ENCOUNTER — Encounter: Payer: Self-pay | Admitting: Obstetrics and Gynecology

## 2018-01-11 VITALS — BP 157/92 | HR 84 | Ht 65.5 in | Wt 169.4 lb

## 2018-01-11 DIAGNOSIS — Z1211 Encounter for screening for malignant neoplasm of colon: Secondary | ICD-10-CM

## 2018-01-11 DIAGNOSIS — Z1212 Encounter for screening for malignant neoplasm of rectum: Secondary | ICD-10-CM

## 2018-01-11 DIAGNOSIS — D251 Intramural leiomyoma of uterus: Secondary | ICD-10-CM

## 2018-01-11 DIAGNOSIS — Z01411 Encounter for gynecological examination (general) (routine) with abnormal findings: Secondary | ICD-10-CM

## 2018-01-11 DIAGNOSIS — Z01419 Encounter for gynecological examination (general) (routine) without abnormal findings: Secondary | ICD-10-CM | POA: Insufficient documentation

## 2018-01-11 DIAGNOSIS — D649 Anemia, unspecified: Secondary | ICD-10-CM

## 2018-01-11 DIAGNOSIS — D250 Submucous leiomyoma of uterus: Secondary | ICD-10-CM

## 2018-01-11 LAB — POCT HEMOGLOBIN: HEMOGLOBIN: 9.2 g/dL — AB (ref 12.2–16.2)

## 2018-01-11 LAB — HEMOCCULT GUIAC POC 1CARD (OFFICE): Fecal Occult Blood, POC: NEGATIVE

## 2018-01-11 NOTE — Patient Instructions (Signed)
Sonohysterogram A sonohysterogram is a procedure to examine the inside of the uterus. This exam uses sound waves that are sent to a computer to make images of the lining of the uterus (endometrium). To get the best images, a germ-free, salt-water solution (sterile saline) is put into the uterus through the vagina. You may have this procedure if you have certain reproductive problems, such as abnormal bleeding, infertility, or miscarriage. This procedure can show what may be causing these problems. Possible causes include scarring or abnormal growths such as fibroids inside your uterus. It can also show if your uterus is an abnormal shape or if the lining of the uterus is too thin. Tell a health care provider about:  All medicines you are taking, including vitamins, herbs, eye drops, creams, and over-the-counter medicines.  Any allergies you have.  Any blood disorders you have.  Any surgeries you have had.  Any medical conditions you have.  Whether you are pregnant or may be pregnant.  The date of the first day of your last period.  Any signs of infection, such as fever, pain in your lower abdomen, or abnormal discharge from your vagina. What are the risks? Generally, this is a safe procedure. However, problems may occur, including:  Abdominal pain or cramping.  Light bleeding (spotting).  Increased vaginal discharge.  Infection. What happens before the procedure?  Your health care provider may have you take an over-the-counter pain medicine.  You may be given medicine to stop any abnormal bleeding.  You may be given antibiotic medicine to help prevent infection.  You may be asked to take a pregnancy test. This is usually in the form of a urine test.  You may have a pelvic exam.  You will be asked to empty your bladder. What happens during the procedure?  You will lie down on the exam table with your feet in stirrups or with your knees bent and your feet flat on the  table.  A slender, handheld device (transducer) will be lubricated and placed into your vagina.  The transducer will be positioned to send sound waves to your uterus. The sound waves are sent to a computer and are turned into images, which your health care provider sees during the procedure.  The transducer will be removed from your vagina.  An instrument will be inserted to widen the opening of your vagina (speculum).  A swab with germ-killing solution (antiseptic) will be used to clean the opening to your uterus (cervix).  A long, thin tube (catheter) will be placed through your cervix into your uterus.  The speculum will be removed.  The transducer will be placed back into your vagina to take more images.  Your uterus will be filled with a germ-free, salt-water solution (sterile saline) through the catheter. You may feel some cramping.  A fluid that contains air bubbles may be sent through the catheter to make it easier to see the fallopian tubes.  The transducer and catheter will be removed. The procedure may vary among health care providers and hospitals. What happens after the procedure?  It is up to you to get the results of your procedure. Ask your health care provider, or the department that is doing the procedure, when your results will be ready. Summary  A sonohysterogram is a procedure that creates images of the inside of the uterus.  The risks of this procedure are very low. Most women experience cramping and spotting after the procedure.  You may need to have a   pelvic exam and take a pregnancy test before this procedure. This procedure will not be done if you are pregnant or have an infection. This information is not intended to replace advice given to you by your health care provider. Make sure you discuss any questions you have with your health care provider. Document Released: 08/21/2013 Document Revised: 03/02/2016 Document Reviewed: 03/02/2016 Elsevier  Interactive Patient Education  2017 Else[employer]  

## 2018-01-11 NOTE — Progress Notes (Signed)
   [facility] Visit  @DATE @            Patient name: Margaret Owen MRN [id number]  Date of birth: [birth year]  CC & HPI:  Margaret Owen is a 41 y.o. female presenting today for follow-up discussion regarding submucosal fibroid.  The patient is here for visit of over 45 minutes including lengthy discussion of treatment options ranging from embolization to hysteroscopic resection 2 General Information over hysterectomy, hysteroscopy, and fibroids in general.  Given several brochures  Essentially this 42 year old female status post cesarean section x3 with known single submucosal fibroid was seen 6 months ago, treated with Ortho Evra oral contraceptive patches which worked fairly well until recently.  Most recently she had a.  Which is lasted since late August until recently she had a 2-day break and then has been bleeding last 2 days. Hemoglobin is 9.2   ROS:  ROS   Pertinent History Reviewed:   Reviewed: Significant for \\Medical         History reviewed. No pertinent past medical history.                            Surgical Hx:    Past Surgical History:  Procedure Laterality Date  . CESAREAN SECTION     2000,2003,2005  . CESAREAN SECTION  08/17/1998   Medications: Reviewed & Updated - see associated section                       Current Outpatient Medications:  .  norelgestromin-ethinyl estradiol (ORTHO EVRA) 150-35 MCG/24HR transdermal patch, Place 1 patch onto the skin once a week., Disp: 3 patch, Rfl: 12   Social History: Reviewed -  reports that she has never smoked. She has never used smokeless tobacco.  Objective Findings:  Vitals: Blood pressure (!) 157/92, pulse 84, height 5' 5.5" (1.664 m), weight 169 lb 6.4 oz (76.8 kg), last menstrual period 12/14/2017.  PHYSICAL EXAMINATION General appearance - alert, well appearing, and in no distress, oriented to person, place, and time and normal appearing weight Mental status - alert, oriented to person, place,  and time, normal mood, behavior, speech, dress, motor activity, and thought processes Chest - clear to auscultation, no wheezes, rales or rhonchi, symmetric air entry Heart - normal rate and regular rhythm Abdomen - soft, nontender, nondistended, no masses or organomegaly Breasts -  Skin -  Physical Examination: Pelvic - VULVA: normal appearing vulva with no masses, tenderness or lesions, VAGINA: normal appearing vagina with normal color and discharge, no lesions, CERVIX: normal appearing cervix without discharge or lesions, UTERUS: uterus is normal size, shape, consistency and nontender, enlarged to 8-10 week's size, ADNEXA: normal adnexa in size, nontender and no masses  P   Assessment & Plan:   A:  1.  Submucosal fibroid, probably type 0 or type I 2. Menorrhagia with anemia  P:  1.  Sonohysterogram, to be scheduled in 2 weeks 2. Patient to pursue talking to interventional radiology about success rate of interventional radiology embolization of type 0 or type I fibroids 3    iron tablets daily

## 2018-01-17 LAB — CYTOLOGY - PAP
Chlamydia: NEGATIVE
DIAGNOSIS: NEGATIVE
HPV (WINDOPATH): NOT DETECTED
Neisseria Gonorrhea: NEGATIVE

## 2018-01-25 ENCOUNTER — Telehealth: Payer: Self-pay | Admitting: Obstetrics and Gynecology

## 2018-01-25 MED ORDER — METRONIDAZOLE 500 MG PO TABS: 500.0000 mg | ORAL_TABLET | Freq: Two times a day (BID) | ORAL | 1 refills | Status: DC

## 2018-01-25 NOTE — Telephone Encounter (Signed)
Left message for pt that I will tx Metronidazole 500 bid x 7 days, then reassess.

## 2018-01-28 ENCOUNTER — Other Ambulatory Visit: Payer: Self-pay | Admitting: Obstetrics and Gynecology

## 2018-01-28 DIAGNOSIS — D250 Submucous leiomyoma of uterus: Secondary | ICD-10-CM

## 2018-01-31 ENCOUNTER — Other Ambulatory Visit: Payer: BC Managed Care – PPO

## 2018-01-31 ENCOUNTER — Telehealth: Payer: Self-pay | Admitting: *Deleted

## 2018-01-31 NOTE — Telephone Encounter (Signed)
Patient called stating she has started her cycle and wants to see if she needs to reschedule.  Discussed with Dr Glo Herring who states it would be best if she rescheduled.  Verbalized understanding and will reschedule.

## 2018-02-08 ENCOUNTER — Other Ambulatory Visit: Payer: BC Managed Care – PPO

## 2018-02-24 ENCOUNTER — Ambulatory Visit: Payer: BC Managed Care – PPO

## 2018-02-24 ENCOUNTER — Other Ambulatory Visit: Payer: Self-pay | Admitting: Obstetrics and Gynecology

## 2018-02-24 ENCOUNTER — Ambulatory Visit (INDEPENDENT_AMBULATORY_CARE_PROVIDER_SITE_OTHER): Payer: BC Managed Care – PPO | Admitting: Obstetrics and Gynecology

## 2018-02-24 DIAGNOSIS — D500 Iron deficiency anemia secondary to blood loss (chronic): Secondary | ICD-10-CM

## 2018-02-24 DIAGNOSIS — D251 Intramural leiomyoma of uterus: Secondary | ICD-10-CM

## 2018-02-24 DIAGNOSIS — D250 Submucous leiomyoma of uterus: Secondary | ICD-10-CM

## 2018-02-24 DIAGNOSIS — D219 Benign neoplasm of connective and other soft tissue, unspecified: Secondary | ICD-10-CM

## 2018-02-24 DIAGNOSIS — N920 Excessive and frequent menstruation with regular cycle: Secondary | ICD-10-CM

## 2018-02-24 MED ORDER — DOXYCYCLINE HYCLATE 100 MG PO CAPS: 100.0000 mg | ORAL_CAPSULE | Freq: Two times a day (BID) | ORAL | 0 refills | Status: DC

## 2018-02-24 NOTE — Progress Notes (Addendum)
Patient ID: Margaret Owen, female   DOB: May 19, 1975, 42 y.o.   MRN: [id number]    [facility] Visit  @DATE @            Patient name: Margaret Owen MRN [id number]  Date of birth: 02-18-1976  CC & HPI:  Margaret Owen is a 42 y.o. female presenting today for a TV ultrasound and sonohysterogram. sonohysterogram today shows 2 anterior fundal fibroids, intramural and deforming the anterior endometrial cavity. No lesions noted on endomerial cavity. She has not had an endometrial biopsy.  As per today's u/s, She has two submucousal fibroids, one larger than the other. She has AUB for months at a time and is looking into embolization.  ROS:  ROS + AUB + fibroids - fever - chills All systems are negative except as noted in the HPI and PMH.   Pertinent History Reviewed:   Reviewed: Medical        No past medical history on file.                            Surgical Hx:    Past Surgical History:  Procedure Laterality Date  . CESAREAN SECTION     2000,2003,2005  . CESAREAN SECTION  08/17/1998   Medications: Reviewed & Updated - see associated section                       Current Outpatient Medications:  .  doxycycline (VIBRAMYCIN) 100 MG capsule, Take 1 capsule (100 mg total) by mouth 2 (two) times daily., Disp: 2 capsule, Rfl: 0 .  metroNIDAZOLE (FLAGYL) 500 MG tablet, Take 1 tablet (500 mg total) by mouth 2 (two) times daily., Disp: 14 tablet, Rfl: 1 .  norelgestromin-ethinyl estradiol (ORTHO EVRA) 150-35 MCG/24HR transdermal patch, Place 1 patch onto the skin once a week., Disp: 3 patch, Rfl: 12   Social History: Reviewed -  reports that she has never smoked. She has never used smokeless tobacco.  Objective Findings:  Vitals: There were no vitals taken for this visit.  PHYSICAL EXAMINATION General appearance - alert, well appearing, and in no distress and oriented to person, place, and time Mental status - alert, oriented to person, place, and time, normal  mood, behavior, speech, dress, motor activity, and thought processes, affect appropriate to mood  PELVIC External genitalia - normal Vulva - normal Vagina - normal Cervix - normal large multiparous without lesions. Uterus - enlarged, 10wk size, due to fibroid enlargement of the uterine body. Adnexa nontender, w/o appreciable masses.  Assessment & Plan:   A:  1.  2. Transvaginal ultrasound and sonohysterogram done today 3. Two fibroids in the fundus of the uterus, one larger than the other  P:   1. Interventional Radiology [phone]) appt made for Thurs 03/03/2018 @1pm  2. Rx Doxycycline 3. MR pelvis w/ and w/o contrast Monday @4pm  at [facility] 4. CBC CMET ferritin 5. F/U 1 month The provider spent over 60 minutes with the visit, including pre visit review, documentation, with >than 50% spent in counseling and coordination of care., making appts after discussing with IR staff.   By signing my name below, I, De Burrs, attest that this documentation has been prepared under the direction and in the presence of Jonnie Kind, MD. Electronically Signed: De Burrs, Medical Scribe. 02/24/18. 2:30 PM.  I personally performed the services described in this documentation, which was SCRIBED in my presence.  The recorded information has been reviewed and considered accurate. It has been edited as necessary during review. Jonnie Kind, MD

## 2018-02-24 NOTE — Progress Notes (Addendum)
PELVIC US TA/TV W/SONOHYSTROGRAM,enlarged heterogeneous uterus w/multiple intramural and subserosal fibroids,(#1) anterior intramural fundal fibroid 6.1 x 5.5 x 6.1 cm,(#2) mid left intramural fibroid 3.1 x 3.8 x 3.2 cm,normal right ovary,simple left ovarian cyst 2.5 x 1.9 x 1.9 cm,no free fluid,EEC 3.9 mm,Sonohysterogram was preformed by Dr.Ferguson w/ultrasound guidance and surveillance throughout the procedure. The saline outlined the smooth symmetrical walls of the endometrium,no solitary masses noted w/in the endometrial cavity.

## 2018-02-25 LAB — CBC
HEMATOCRIT: 30.8 % — AB (ref 34.0–46.6)
Hemoglobin: 9.3 g/dL — ABNORMAL LOW (ref 11.1–15.9)
MCH: 20.9 pg — AB (ref 26.6–33.0)
MCHC: 30.2 g/dL — ABNORMAL LOW (ref 31.5–35.7)
MCV: 69 fL — AB (ref 79–97)
Platelets: 256 10*3/uL (ref 150–450)
RBC: 4.45 x10E6/uL (ref 3.77–5.28)
RDW: 15.3 % (ref 12.3–15.4)
WBC: 8 10*3/uL (ref 3.4–10.8)

## 2018-02-25 LAB — FERRITIN: Ferritin: 4 ng/mL — ABNORMAL LOW (ref 15–150)

## 2018-02-25 LAB — COMPREHENSIVE METABOLIC PANEL
ALK PHOS: 51 IU/L (ref 39–117)
ALT: 6 IU/L (ref 0–32)
AST: 12 IU/L (ref 0–40)
Albumin/Globulin Ratio: 1.5 (ref 1.2–2.2)
Albumin: 4.2 g/dL (ref 3.5–5.5)
BUN/Creatinine Ratio: 12 (ref 9–23)
BUN: 9 mg/dL (ref 6–24)
Bilirubin Total: <0.2 mg/dL (ref 0.0–1.2)
CO2: 26 mmol/L (ref 20–29)
CREATININE: 0.78 mg/dL (ref 0.57–1.00)
Calcium: 9 mg/dL (ref 8.7–10.2)
Chloride: 99 mmol/L (ref 96–106)
GFR calc Af Amer: 108 mL/min/{1.73_m2} (ref >59)
GFR calc non Af Amer: 94 mL/min/{1.73_m2} (ref >59)
GLUCOSE: 92 mg/dL (ref 65–99)
Globulin, Total: 2.8 g/dL (ref 1.5–4.5)
Potassium: 3.5 mmol/L (ref 3.5–5.2)
Sodium: 139 mmol/L (ref 134–144)
Total Protein: 7 g/dL (ref 6.0–8.5)

## 2018-02-28 ENCOUNTER — Ambulatory Visit (HOSPITAL_COMMUNITY): Payer: BC Managed Care – PPO

## 2018-03-03 ENCOUNTER — Other Ambulatory Visit: Payer: BC Managed Care – PPO

## 2018-03-23 ENCOUNTER — Telehealth: Payer: Self-pay | Admitting: Obstetrics and Gynecology

## 2018-03-23 NOTE — Telephone Encounter (Signed)
Pt called stating that the MRI she was scheduled to have had to be cancelled because insurance did not approve for it to be done the way it was ordered. She requests for it to be reordered and to see if we can find out why insurance would not pay. Advised pt that I would look into this issue and get back with her. Pt verbalized understanding.

## 2018-03-23 NOTE — Telephone Encounter (Signed)
Patient called and cancelled her appointment for 03/24/18 since her MRI was not approved.  She also called to follow up on a mychart message she sent on 03/20/18.    [phone]

## 2018-03-24 ENCOUNTER — Ambulatory Visit: Payer: BC Managed Care – PPO | Admitting: Obstetrics and Gynecology

## 2018-04-23 ENCOUNTER — Other Ambulatory Visit: Payer: Self-pay | Admitting: Obstetrics and Gynecology

## 2019-01-23 ENCOUNTER — Telehealth: Payer: Self-pay | Admitting: *Deleted

## 2019-01-23 NOTE — Telephone Encounter (Signed)
Called patient on number left on vm and is not correct number. Also called mobile number and left vm.

## 2019-01-23 NOTE — Telephone Encounter (Signed)
Patient called requesting to speak with Anderson Malta regarding a refill.

## 2019-04-19 ENCOUNTER — Other Ambulatory Visit: Payer: Self-pay | Admitting: Obstetrics and Gynecology

## 2019-07-06 ENCOUNTER — Ambulatory Visit
Admission: EM | Admit: 2019-07-06 | Discharge: 2019-07-06 | Disposition: A | Discharge status: Home / Self Care | Payer: BC Managed Care – PPO | Attending: Emergency Medicine | Admitting: Emergency Medicine

## 2019-07-06 ENCOUNTER — Other Ambulatory Visit: Payer: Self-pay

## 2019-07-06 DIAGNOSIS — I10 Essential (primary) hypertension: Secondary | ICD-10-CM

## 2019-07-06 MED ORDER — AMLODIPINE BESYLATE 5 MG PO TABS: 5.0000 mg | ORAL_TABLET | Freq: Every day | ORAL | 0 refills | Status: DC

## 2019-07-06 MED ORDER — CLONIDINE HCL 0.1 MG PO TABS
0.1000 mg | ORAL_TABLET | Freq: Once | ORAL | Status: AC
  Administered 2019-07-06: 0.1 mg via ORAL

## 2019-07-06 NOTE — ED Notes (Signed)
Instructed by provider to monitor pt for a while after bp medication given then recheck bp prior to discharge.

## 2019-07-06 NOTE — ED Provider Notes (Signed)
[facility]    CSN: [id number] Arrival date & time: 07/06/19  1405      History   Chief Complaint Chief Complaint  Patient presents with  . Hypertension    HPI Margaret Owen is a 44 y.o. female.   who presents to the urgent care for complaint of high blood pressure.  Reports he has been having intermittent headache over few days.  States blood pressure on average is 140/90. Was not on any blood pressure medication. Has a PCP and has an appointment coming up for 07/27/2019.  Currently denies HA, vision changes, dizziness, lightheadedness, chest pain, shortness of breath, numbness or tingling in extremities, abdominal pain, changes in bowel or bladder habits.     The history is provided by the patient. No language interpreter was used.  Hypertension    History reviewed. No pertinent past medical history.  Patient Active Problem List   Diagnosis Date Noted  . Intramural and submucous leiomyoma of uterus 07/14/2017  . Malpositioned IUD 04/14/2017  . 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 Breakthrough bleeding associated with intrauterine device (IUD) 03/22/2017  . IUD contraception 10/05/2016  . Pedal edema 10/05/2016  . MIGRAINES, HX OF 06/11/2008    Past Surgical History:  Procedure Laterality Date  . CERVICAL CERCLAGE    . CESAREAN SECTION     2000,2003,2005  . CESAREAN SECTION  08/17/1998    OB History    Gravida  5   Para  3   Term      Preterm  3   AB  2   Living  3     SAB  1   TAB  1   Ectopic      Multiple      Live Births  3            Home Medications    Prior to Admission medications   Medication Sig Start Date End Date Taking? Authorizing Provider  Marilu Favre 150-35 MCG/24HR transdermal patch APPLY 1 PATCH EXTERNALLY TO THE SKIN 1 TIME A WEEK 04/19/19  Yes Jonnie Kind, MD  amLODipine (NORVASC) 5 MG tablet Take 1 tablet (5 mg total) by mouth daily. 07/06/19   Reiley Bertagnolli, Darrelyn Hillock, FNP  doxycycline (VIBRAMYCIN) 100  MG capsule Take 1 capsule (100 mg total) by mouth 2 (two) times daily. 02/24/18   Jonnie Kind, MD  metroNIDAZOLE (FLAGYL) 500 MG tablet Take 1 tablet (500 mg total) by mouth 2 (two) times daily. 01/25/18   Jonnie Kind, MD    Family History Family History  Problem Relation Age of Onset  . Heart failure Father   . Alcohol abuse Father   . Heart disease Father 47       died at 58  . Hypertension Father   . Stroke Mother   . Hypertension Mother   . Arthritis Mother   . Asthma Mother   . Diabetes Mother   . Hyperlipidemia Mother   . Glaucoma Mother   . Breast cancer Maternal Aunt   . Colon cancer Maternal Aunt   . Stroke Maternal Aunt   . Glaucoma Sister   . Asthma Brother     Social History Social History   Tobacco Use  . Smoking status: Never Smoker  . Smokeless tobacco: Never Used  Substance Use Topics  . Alcohol use: Yes    Comment: occasional  . Drug use: No     Allergies   Patient has no known allergies.   Review of  Systems Review of Systems  Constitutional: Negative.   HENT: Negative.   Respiratory: Negative.   Cardiovascular: Negative.   Musculoskeletal: Negative.   Neurological: Negative.   All other systems reviewed and are negative.    Physical Exam Triage Vital Signs ED Triage Vitals [07/06/19 1417]  Enc Vitals Group     BP (!) 161/112     Pulse Rate 71     Resp 20     Temp 98.5 F (36.9 C)     Temp Source Oral     SpO2 99 %     Weight 169 lb (76.7 kg)     Height 5\' 4"  (1.626 m)     Head Circumference      Peak Flow      Pain Score 0     Pain Loc      Pain Edu?      Excl. in [place]?    No data found.  Updated Vital Signs BP (!) 169/102 (BP Location: Right Arm)   Pulse 79   Temp 98.5 F (36.9 C) (Oral)   Resp 20   Ht 5\' 4"  (1.626 m)   Wt 169 lb (76.7 kg)   LMP 06/29/2019 (Approximate)   SpO2 98%   BMI 29.01 kg/m   Visual Acuity Right Eye Distance:   Left Eye Distance:   Bilateral Distance:    Right Eye Near:    Left Eye Near:    Bilateral Near:     Physical Exam Vitals and nursing note reviewed.  Constitutional:      General: She is not in acute distress.    Appearance: Normal appearance. She is normal weight. She is not ill-appearing, toxic-appearing or diaphoretic.  HENT:     Head: Normocephalic.     Right Ear: Tympanic membrane, ear canal and external ear normal. There is no impacted cerumen.     Left Ear: Tympanic membrane, ear canal and external ear normal. There is no impacted cerumen.  Cardiovascular:     Rate and Rhythm: Normal rate and regular rhythm.     Pulses: Normal pulses.     Heart sounds: Normal heart sounds. No murmur. No gallop.   Pulmonary:     Effort: Pulmonary effort is normal. No respiratory distress.     Breath sounds: Normal breath sounds. No stridor. No wheezing or rhonchi.  Chest:     Chest wall: No tenderness.  Neurological:     General: No focal deficit present.     Mental Status: She is alert and oriented to person, place, and time.     Cranial Nerves: Cranial nerves are intact.     Sensory: Sensation is intact.     Motor: Motor function is intact.     Coordination: Coordination is intact.     Gait: Gait is intact.      UC Treatments / Results  Labs (all labs ordered are listed, but only abnormal results are displayed) Labs Reviewed - No data to display  EKG   Radiology No results found.  Procedures Procedures (including critical care time)  Medications Ordered in UC Medications  cloNIDine (CATAPRES) tablet 0.1 mg (has no administration in time range)    Initial Impression / Assessment and Plan / UC Course  I have reviewed the triage vital signs and the nursing notes.  Pertinent labs & imaging results that were available during my care of the patient were reviewed by me and considered in my medical decision making (see chart for details).  Review past 2 years blood pressure measurement in office through historical data, BP has been  high.  We will start this patient on Norvasc 5 mg daily.  Advised patient to follow-up with primary care for further evaluation.  Has an appointment on 07/27/2019.  Was advised to keep this appointment  Patient stable at discharge. Clonidine 0.1 mg was given in office Patient was advised to follow-up with primary care Advised for low-salt diet and physical activity   Final Clinical Impressions(s) / UC Diagnoses   Final diagnoses:  Essential hypertension     Discharge Instructions     Please continue to monitor blood pressure at home and keep a log Eat a well balanced diet of fruits, vegetables and lean meats.  Avoid foods high in fat and salt Drink water.  At least half your body weight in ounces Exercise for at least 30 minutes daily Return or go to the ED if you have any new or worsening symptoms such as vision changes, fatigue, dizziness, chest pain, shortness of breath, nausea, swelling in your hands or feet, urinary symptoms, etc...     ED Prescriptions    Medication Sig Dispense Auth. Provider   amLODipine (NORVASC) 5 MG tablet Take 1 tablet (5 mg total) by mouth daily. 20 tablet Adriannah Steinkamp, Darrelyn Hillock, FNP     PDMP not reviewed this encounter.   Emerson Monte, FNP 07/06/19 [phone]

## 2019-07-06 NOTE — Discharge Instructions (Addendum)
Please continue to monitor blood pressure at home and keep a log Eat a well balanced diet of fruits, vegetables and lean meats.  Avoid foods high in fat and salt Drink water.  At least half your body weight in ounces Exercise for at least 30 minutes daily Return or go to the ED if you have any new or worsening symptoms such as vision changes, fatigue, dizziness, chest pain, shortness of breath, nausea, swelling in your hands or feet, urinary symptoms, etc..Marland Kitchen

## 2019-07-06 NOTE — ED Triage Notes (Signed)
Pt reports bp has been elevated recently.  Reports has been having intermittent headaches and at times, feels "sluggish."  Pt denies any symptoms at this time.  Denies history of htn.

## 2019-07-27 ENCOUNTER — Other Ambulatory Visit: Payer: Self-pay

## 2019-07-27 ENCOUNTER — Encounter: Payer: Self-pay | Admitting: Family Medicine

## 2019-07-27 ENCOUNTER — Ambulatory Visit (INDEPENDENT_AMBULATORY_CARE_PROVIDER_SITE_OTHER): Payer: BC Managed Care – PPO | Admitting: Family Medicine

## 2019-07-27 VITALS — BP 130/90 | HR 79 | Temp 97.5°F | Resp 16 | Ht 64.0 in | Wt 165.0 lb

## 2019-07-27 DIAGNOSIS — D649 Anemia, unspecified: Secondary | ICD-10-CM | POA: Insufficient documentation

## 2019-07-27 DIAGNOSIS — I10 Essential (primary) hypertension: Secondary | ICD-10-CM | POA: Diagnosis not present

## 2019-07-27 DIAGNOSIS — D250 Submucous leiomyoma of uterus: Secondary | ICD-10-CM | POA: Diagnosis not present

## 2019-07-27 DIAGNOSIS — D251 Intramural leiomyoma of uterus: Secondary | ICD-10-CM

## 2019-07-27 DIAGNOSIS — Z1231 Encounter for screening mammogram for malignant neoplasm of breast: Secondary | ICD-10-CM | POA: Insufficient documentation

## 2019-07-27 MED ORDER — AMLODIPINE BESYLATE 10 MG PO TABS: 10.0000 mg | ORAL_TABLET | Freq: Every day | ORAL | 1 refills | Status: DC

## 2019-07-27 NOTE — Assessment & Plan Note (Signed)
Getting updated CBC secondary to having fibroids and heavy bleeding.  Last labs in the system demonstrated a hemoglobin of 9.3% would like to start her on something such as oral iron if level comes back that way again.

## 2019-07-27 NOTE — Progress Notes (Signed)
Subjective:  Patient ID: Margaret Owen, female    DOB: 07-23-1975  Age: 44 y.o. MRN: [id number]  CC:  Chief Complaint  Patient presents with  . Establish Care  . Fibroids    having problems again with heavy menstrual cycles. Has come on twice in March and still bleeding   . Hypertension    was started on BP med at urgent care and ran out yest      HPI  HPI   Margaret Owen is a 44 year old female patient who was previously seen by Dr. Meda Coffee back in 2019.  Presents today to establish care with new provider here in the practice.  Only significant history is fibroids and more recently hypertension of which she was started on Norvasc after going to the urgent care.  Reports that her blood pressures are still running in the 140s over 90s and she tolerated the Norvasc well and wondered if we could go up on the dose.  She is not had blood work in a while and is unsure if she is having any anemic issues secondary to having heavy cycles with the fibroids.  Was supposed to have a procedure done in 2020 but secondary to Covid she put that off.  Is looking to get back in with Dr. Glo Herring at the OB/GYN office to see if she can have that procedure done now.  Overall she needs to get updated mammogram.  Some updated labs and to get a physical ordered.   She denies having any issues or concerns today outside of getting her blood pressure regulated. And would like a work note just to say that she was here today.  Today patient denies signs and symptoms of COVID 19 infection including fever, chills, cough, shortness of breath, and headache. Past Medical, Surgical, Social History, Allergies, and Medications have been Reviewed.   Past Medical History:  Diagnosis Date  . Breakthrough bleeding associated with intrauterine device (IUD) 03/22/2017  . IUD contraception 10/05/2016  . Malpositioned IUD 04/14/2017   REmoved 04/14/17   . MIGRAINES, HX OF 06/11/2008   Qualifier: Diagnosis of  By: Truett Mainland  MD, Christine    . Pedal edema 10/05/2016    Current Meds  Medication Sig  . amLODipine (NORVASC) 10 MG tablet Take 1 tablet (10 mg total) by mouth daily.  Marilu Favre 150-35 MCG/24HR transdermal patch APPLY 1 PATCH EXTERNALLY TO THE SKIN 1 TIME A WEEK  . [DISCONTINUED] amLODipine (NORVASC) 5 MG tablet Take 1 tablet (5 mg total) by mouth daily.    ROS:  Review of Systems  Constitutional: Negative.   HENT: Negative.   Eyes: Negative.   Respiratory: Negative.   Cardiovascular: Negative.   Gastrointestinal: Negative.   Genitourinary: Negative.   Musculoskeletal: Negative.   Skin: Negative.   Neurological: Negative.   Endo/Heme/Allergies: Negative.   Psychiatric/Behavioral: Negative.   All other systems reviewed and are negative.    Objective:   Today's Vitals: BP 130/90   Pulse 79   Temp (!) 97.5 F (36.4 C)   Resp 16   Ht 5\' 4"  (1.626 m)   Wt 165 lb (74.8 kg)   LMP 06/29/2019 (Approximate)   SpO2 99%   BMI 28.32 kg/m  Vitals with BMI 07/27/2019 07/06/2019 07/06/2019  Height 5\' 4"  - -  Weight 165 lbs - -  BMI [id number] - -  Systolic [id number]  Diastolic 90 A999333 A999333  Pulse 79 68 79     Physical Exam Vitals and  nursing note reviewed.  Constitutional:      Appearance: Normal appearance. She is well-developed, well-groomed and overweight.  HENT:     Head: Normocephalic and atraumatic.     Right Ear: External ear normal.     Left Ear: External ear normal.     Mouth/Throat:     Comments: Mask in place Eyes:     General:        Right eye: No discharge.        Left eye: No discharge.     Conjunctiva/sclera: Conjunctivae normal.  Cardiovascular:     Rate and Rhythm: Normal rate and regular rhythm.     Pulses: Normal pulses.     Heart sounds: Normal heart sounds.  Pulmonary:     Effort: Pulmonary effort is normal.     Breath sounds: Normal breath sounds.  Musculoskeletal:        General: Normal range of motion.     Cervical back: Normal range of motion and neck  supple.  Skin:    General: Skin is warm.  Neurological:     General: No focal deficit present.     Mental Status: She is alert and oriented to person, place, and time.  Psychiatric:        Attention and Perception: Attention normal.        Mood and Affect: Mood normal.        Speech: Speech normal.        Behavior: Behavior normal. Behavior is cooperative.        Thought Content: Thought content normal.        Cognition and Memory: Cognition normal.        Judgment: Judgment normal.          Assessment   1. Essential hypertension   2. Fibroids, submucosal   3. Intramural and submucous leiomyoma of uterus   4. Anemia, unspecified type   5. Encounter for screening mammogram for malignant neoplasm of breast     Tests ordered Orders Placed This Encounter  Procedures  . MM 3D SCREEN BREAST BILATERAL  . CBC  . COMPLETE METABOLIC PANEL WITH GFR     Plan: Please see assessment and plan per problem list above.   Meds ordered this encounter  Medications  . amLODipine (NORVASC) 10 MG tablet    Sig: Take 1 tablet (10 mg total) by mouth daily.    Dispense:  30 tablet    Refill:  1    Order Specific Question:   Supervising Provider    Answer:   Jacklynn Bue    Patient to follow-up in 10/26/2019 annual visit   Perlie Mayo, NP

## 2019-07-27 NOTE — Assessment & Plan Note (Signed)
Not well controlled at this time.  Will start Norvasc at 10 mg that she tolerated 5 mg.  She reports that she is still having some elevation of blood pressure when she checked it at home. Encouraged DASH diet, 30 to 60 minutes of physical activity at least 5 days of the week if not more.  Reviewed side effects, risks and benefits of medication.   Patient acknowledged agreement and understanding of the plan.

## 2019-07-27 NOTE — Patient Instructions (Signed)
I appreciate the opportunity to provide you with care for your health and wellness. Today we discussed: establish care  Follow up: 3 months for annual   Labs today  Please continue to practice social distancing to keep you, your family, and our community safe.  If you must go out, please wear a mask and practice good handwashing.  It was a pleasure to see you and I look forward to continuing to work together on your health and well-being. Please do not hesitate to call the office if you need care or have questions about your care.  Have a wonderful day and week. With Gratitude, Cherly Beach, DNP, AGNP-BC

## 2019-07-29 LAB — COMPLETE METABOLIC PANEL WITH GFR
AG Ratio: 1.2 (calc) (ref 1.0–2.5)
ALT: 8 U/L (ref 6–29)
AST: 11 U/L (ref 10–30)
Albumin: 3.7 g/dL (ref 3.6–5.1)
Alkaline phosphatase (APISO): 41 U/L (ref 31–125)
BUN: 10 mg/dL (ref 7–25)
CO2: 27 mmol/L (ref 20–32)
Calcium: 9 mg/dL (ref 8.6–10.2)
Chloride: 105 mmol/L (ref 98–110)
Creat: 0.66 mg/dL (ref 0.50–1.10)
GFR, Est African American: 125 mL/min/{1.73_m2} (ref >=60)
GFR, Est Non African American: 107 mL/min/{1.73_m2} (ref >=60)
Globulin: 3.1 g/dL (calc) (ref 1.9–3.7)
Glucose, Bld: 75 mg/dL (ref 65–139)
Potassium: 3.5 mmol/L (ref 3.5–5.3)
Sodium: 138 mmol/L (ref 135–146)
Total Bilirubin: 0.4 mg/dL (ref 0.2–1.2)
Total Protein: 6.8 g/dL (ref 6.1–8.1)

## 2019-07-29 LAB — IRON,TIBC AND FERRITIN PANEL
%SAT: 4 % (calc) — ABNORMAL LOW (ref 16–45)
Ferritin: 2 ng/mL — ABNORMAL LOW (ref 16–232)
Iron: 23 ug/dL — ABNORMAL LOW (ref 40–190)
TIBC: 530 mcg/dL (calc) — ABNORMAL HIGH (ref 250–450)

## 2019-07-29 LAB — CBC
HCT: 28.5 % — ABNORMAL LOW (ref 35.0–45.0)
Hemoglobin: 8.4 g/dL — ABNORMAL LOW (ref 11.7–15.5)
MCH: 21.5 pg — ABNORMAL LOW (ref 27.0–33.0)
MCHC: 29.5 g/dL — ABNORMAL LOW (ref 32.0–36.0)
MCV: 73.1 fL — ABNORMAL LOW (ref 80.0–100.0)
MPV: 10.8 fL (ref 7.5–12.5)
Platelets: 203 10*3/uL (ref 140–400)
RBC: 3.9 10*6/uL (ref 3.80–5.10)
RDW: 15.2 % — ABNORMAL HIGH (ref 11.0–15.0)
WBC: 4.7 10*3/uL (ref 3.8–10.8)

## 2019-07-29 LAB — B12 AND FOLATE PANEL
Folate: 21.2 ng/mL
Vitamin B-12: 277 pg/mL (ref 200–1100)

## 2019-07-29 LAB — TEST AUTHORIZATION

## 2019-08-01 ENCOUNTER — Encounter: Payer: Self-pay | Admitting: Family Medicine

## 2019-08-03 ENCOUNTER — Ambulatory Visit (HOSPITAL_COMMUNITY)
Admission: RE | Admit: 2019-08-03 | Discharge: 2019-08-03 | Disposition: A | Discharge status: Home / Self Care | Payer: BC Managed Care – PPO | Source: Ambulatory Visit | Attending: Family Medicine | Admitting: Family Medicine

## 2019-08-03 ENCOUNTER — Other Ambulatory Visit: Payer: Self-pay

## 2019-08-03 DIAGNOSIS — Z1231 Encounter for screening mammogram for malignant neoplasm of breast: Secondary | ICD-10-CM | POA: Diagnosis present

## 2019-08-13 ENCOUNTER — Encounter: Payer: Self-pay | Admitting: Family Medicine

## 2019-09-11 ENCOUNTER — Encounter: Payer: Self-pay | Admitting: Obstetrics and Gynecology

## 2019-09-11 ENCOUNTER — Ambulatory Visit (INDEPENDENT_AMBULATORY_CARE_PROVIDER_SITE_OTHER): Payer: BC Managed Care – PPO | Admitting: Obstetrics and Gynecology

## 2019-09-11 DIAGNOSIS — D259 Leiomyoma of uterus, unspecified: Secondary | ICD-10-CM

## 2019-09-11 DIAGNOSIS — N852 Hypertrophy of uterus: Secondary | ICD-10-CM

## 2019-09-11 DIAGNOSIS — D250 Submucous leiomyoma of uterus: Secondary | ICD-10-CM

## 2019-09-11 NOTE — Progress Notes (Signed)
PATIENT ID: Margaret Owen, female     DOB: 1976/02/20, 44 y.o.     MRN: [id number]   [facility] Visit  09/11/19     PATIENT NAME: Margaret Owen     MRN [id number]     DOB: July 13, 1975  CC & HPI:  [name] is a 44 y.o. female presenting today for discussion for fibroid treatment.   She notes that she hasn't had any issues with pain, but she has extremely long menstrual cycles. Sometimes they are extremely heavy, and other times she will have brown spotting. She notes pink spotting after She had an IUD formerly, but she had an issue with it migrating.  And embedding in the myometrium  ROS:  Review of Systems  Constitutional: Negative for chills, diaphoresis, fever, malaise/fatigue and weight loss.  HENT: Negative for congestion and sore throat.   Eyes: Negative for blurred vision and double vision.  Respiratory: Negative for cough and shortness of breath.   Cardiovascular: Negative for chest pain, palpitations and leg swelling.  Gastrointestinal: Negative for constipation, diarrhea, heartburn, nausea and vomiting.  Genitourinary: Negative for frequency and urgency.  Musculoskeletal: Negative for back pain, falls and myalgias.  Skin: Negative for rash.  Neurological: Negative for dizziness, sensory change, weakness and headaches.  Endo/Heme/Allergies: Does not bruise/bleed easily.  Psychiatric/Behavioral: Negative for depression. The patient is not nervous/anxious.    02/24/2018 PELVIC US TA/TV W/SONOHYSTROGRAM, heterogeneous uterus w/multiple intramural and subserosal fibroids,(#1) anterior intramural fundal fibroid 6.1 x 5.5 x 6.1 cm,(#2) mid left intramural fibroid 3.1 x 3.8 x 3.2 cm,normal right ovary,simple left ovarian cyst 2.5 x 1.9 x 1.9 cm,no free fluid,EEC 3.9 mm,Sonohysterogram was preformed by Dr.Kyren Knick w/ultrasound guidance and surveillance throughout the procedure. The saline outlined the smooth symmetrical walls of the endometrium,no solitary masses  noted w/in the endometrial cavity.  Pertinent History Reviewed:  Reviewed: Significant for  Medical         Past Medical History:  Diagnosis Date  . Breakthrough bleeding associated with intrauterine device (IUD) 03/22/2017  . Hypertension   . IUD contraception 10/05/2016  . Malpositioned IUD 04/14/2017   REmoved 04/14/17   . MIGRAINES, HX OF 06/11/2008   Qualifier: Diagnosis of  By: Truett Mainland MD, Christine    . Pedal edema 10/05/2016                              Surgical Hx:    Past Surgical History:  Procedure Laterality Date  . CERVICAL CERCLAGE    . CESAREAN SECTION     2000,2003,2005  . CESAREAN SECTION  08/17/1998   Medications: Reviewed & Updated - see associated section                       Current Outpatient Medications:  .  amLODipine (NORVASC) 10 MG tablet, Take 1 tablet (10 mg total) by mouth daily., Disp: 30 tablet, Rfl: 1 .  XULANE 150-35 MCG/24HR transdermal patch, APPLY 1 PATCH EXTERNALLY TO THE SKIN 1 TIME A WEEK, Disp: 3 patch, Rfl: 12   Social History: Reviewed -  reports that she has never smoked. She has never used smokeless tobacco.  Objective Findings:  Vitals: There were no vitals taken for this visit.  PHYSICAL EXAMINATION General appearance - alert, well appearing, and in no distress, oriented to person, place, and time, normal appearing weight and well hydrated Mental status - alert, oriented to person, place,  and time, normal mood, behavior, speech, dress, motor activity, and thought processes, affect appropriate to mood Chest - not examined Heart - not examined Abdomen - not examined Breasts - not examined Skin - normal coloration and turgor, no rashes, no suspicious skin lesions noted  PELVIC External genitalia -normal female Vulva -normal female for age Vagina -normal secretions good support Cervix - 12 o'clock and 2 o'clock streak of pigmentation on the surface of the cervix.  Likely represents pigmentation but will biopsy for caution and  diagnosis at future appointment Uterus -enlarged 10-week size Adnexa -irregular fibroid uterus with no adnexal masses appreciated [place] -not done Rectal - rectal exam not indicated  Assessment & Plan:   A:  1. Uterine enlargement with fibroids, no progression of size  P:  1. Discussed at length treatment options including IUD which patient declines, endometrial ablation which patient takes the brochure, and compared these to the option of fibroid embolization or hysterectomy.  Patient to read the information go online regarding endometrial ablation and compare with her previous investigations over myomectomy or embolization or hysterectomy   By signing my name below, I, [employer], attest that this documentation has been prepared under the direction and in the presence of Jonnie Kind, MD. Electronically Signed: [place]. 09/11/19. 2:06 PM.  I personally performed the services described in this documentation, which was SCRIBED in my presence. The recorded information has been reviewed and considered accurate. It has been edited as necessary during review. Jonnie Kind, MD

## 2019-09-27 ENCOUNTER — Encounter: Payer: Self-pay | Admitting: Family Medicine

## 2019-10-02 ENCOUNTER — Ambulatory Visit: Payer: BC Managed Care – PPO | Admitting: Obstetrics and Gynecology

## 2019-10-02 ENCOUNTER — Other Ambulatory Visit: Payer: Self-pay | Admitting: Obstetrics and Gynecology

## 2019-10-02 ENCOUNTER — Encounter: Payer: Self-pay | Admitting: Obstetrics and Gynecology

## 2019-10-02 ENCOUNTER — Other Ambulatory Visit: Payer: Self-pay | Admitting: *Deleted

## 2019-10-02 VITALS — BP 117/69 | HR 80 | Ht 65.0 in | Wt 165.2 lb

## 2019-10-02 DIAGNOSIS — L859 Epidermal thickening, unspecified: Secondary | ICD-10-CM

## 2019-10-02 DIAGNOSIS — N889 Noninflammatory disorder of cervix uteri, unspecified: Secondary | ICD-10-CM

## 2019-10-02 DIAGNOSIS — I10 Essential (primary) hypertension: Secondary | ICD-10-CM

## 2019-10-02 MED ORDER — AMLODIPINE BESYLATE 10 MG PO TABS: 10.0000 mg | ORAL_TABLET | Freq: Every day | ORAL | 1 refills | Status: DC

## 2019-10-02 NOTE — Progress Notes (Signed)
Patient ID: Margaret Owen, female   DOB: 1975/08/14, 44 y.o.   MRN: [id number]   Margaret Owen 44 y.o. [id number] here for colposcopy to evaluate two streaks of pigmentation on the cervix at 12 o'clock and 2 o'clock that were seen on her last exam on 09/11/2019. Her last PAP on 01/11/2018 was normal. She reports that she has very heavy periods and that she has light bleeding today.   Discussed role for HPV in cervical dysplasia, need for surveillance.   Patient given informed consent, signed copy in the chart, time out was performed.  Placed in lithotomy position. Cervix viewed with speculum and colposcope after application of acetic acid. Hurricaine Spray was applied to the area. Silver Nitrate was applied after the procedure.  Colposcopy adequate? Yes Linear dark pigmented streak on the anterior of the cervix; biopsies obtained at 12 o clock and 1 o clock.  No ECC specimen obtained. All specimens were labelled and sent to pathology.   Colposcopy IMPRESSION: Pigmented Nevus rule out melanoma  Patient was given post procedure instructions. Will follow up pathology and manage accordingly.  Routine preventative health maintenance measures emphasized. Will follow up with results by phone  By signing my name below, I, [employer], attest that this documentation has been prepared under the direction and in the presence of Jonnie Kind, MD. Electronically Signed: De Burrs, Medical Scribe. 10/02/19. 3:43 PM. I personally performed the services described in this documentation, which was SCRIBED in my presence. The recorded information has been reviewed and considered accurate. It has been edited as necessary during review. Jonnie Kind, MD

## 2019-10-19 MED ORDER — XULANE 150-35 MCG/24HR TD PTWK: MEDICATED_PATCH | TRANSDERMAL | 12 refills | Status: DC

## 2019-10-26 ENCOUNTER — Ambulatory Visit (INDEPENDENT_AMBULATORY_CARE_PROVIDER_SITE_OTHER): Payer: BC Managed Care – PPO | Admitting: Family Medicine

## 2019-10-26 ENCOUNTER — Other Ambulatory Visit: Payer: Self-pay

## 2019-10-26 ENCOUNTER — Encounter: Payer: Self-pay | Admitting: Family Medicine

## 2019-10-26 VITALS — BP 125/80 | HR 83 | Temp 97.3°F | Resp 18 | Ht 64.0 in | Wt 164.4 lb

## 2019-10-26 DIAGNOSIS — E663 Overweight: Secondary | ICD-10-CM | POA: Insufficient documentation

## 2019-10-26 DIAGNOSIS — Z0001 Encounter for general adult medical examination with abnormal findings: Secondary | ICD-10-CM | POA: Diagnosis not present

## 2019-10-26 DIAGNOSIS — I10 Essential (primary) hypertension: Secondary | ICD-10-CM | POA: Diagnosis not present

## 2019-10-26 DIAGNOSIS — Z131 Encounter for screening for diabetes mellitus: Secondary | ICD-10-CM | POA: Diagnosis not present

## 2019-10-26 DIAGNOSIS — Z6828 Body mass index (BMI) 28.0-28.9, adult: Secondary | ICD-10-CM

## 2019-10-26 NOTE — Assessment & Plan Note (Signed)
Much better controlled on Norvasc.  Continue taking this at this time.  She has increased her activity level as well.  Encouraged DASH diet and encouragement to continue the activity changes that she is made.

## 2019-10-26 NOTE — Assessment & Plan Note (Signed)
Stable encouraged her to continue diet and lifestyle changes increasing exercise as well.    Wt Readings from Last 3 Encounters:  10/26/19 164 lb 6.4 oz (74.6 kg)  10/02/19 165 lb 3.2 oz (74.9 kg)  07/27/19 165 lb (74.8 kg)

## 2019-10-26 NOTE — Progress Notes (Signed)
Health Maintenance reviewed -    There is no immunization history on file for this patient. Last Pap smear: 2019 Last mammogram: 07/2019 Last colonoscopy: N/A  Last DEXA: N/A Dentist: Reports needing to start Ophtho: Needs to make appt Exercise: walking and more yard activity Smoker: no Alcohol Use: no  Other doctors caring for patient include:  Patient Care Team: Perlie Mayo, NP as PCP - General (Family Medicine)  End of Life Discussion:  Patient does not have a living will and medical power of attorney  Subjective:   HPI  Margaret Owen is a 44 y.o. female who presents for annual wellness visit and follow-up on chronic medical conditions. Reports some trouble sleep at times. Staying on the phone at times and anxiety about "to do list" Reports some changes in dentition -broken teeth, needs them repaired. Denies changes in bladder or bowel, no blood in urine or stool. Mild constipation at times. Denies memory changes. Denies falls or injuries. Denies skin issue.   Review Of Systems  Review of Systems  Constitutional: Negative.   HENT: Positive for dental problem.   Eyes: Negative.   Respiratory: Negative.   Cardiovascular: Negative.   Gastrointestinal: Negative.   Endocrine: Negative.   Genitourinary: Negative.   Musculoskeletal: Negative.   Skin: Negative.   Allergic/Immunologic: Negative.   Neurological: Negative.   Hematological: Negative.   Psychiatric/Behavioral: Positive for sleep disturbance.  All other systems reviewed and are negative.   Objective:   PHYSICAL EXAM:  BP 125/80 (BP Location: Right Arm, Patient Position: Sitting, Cuff Size: Normal)   Pulse 83   Temp (!) 97.3 F (36.3 C) (Temporal)   Resp 18   Ht 5\' 4"  (1.626 m)   Wt 164 lb 6.4 oz (74.6 kg)   LMP 10/02/2019   SpO2 98%   BMI 28.22 kg/m   Physical Exam Vitals and nursing note reviewed.  Constitutional:      Appearance: Normal appearance. She is well-developed,  well-groomed and overweight.  HENT:     Head: Normocephalic and atraumatic.     Right Ear: Hearing, tympanic membrane, ear canal and external ear normal.     Left Ear: Hearing, tympanic membrane, ear canal and external ear normal.     Nose: Nose normal.     Mouth/Throat:     Lips: Pink.     Mouth: Mucous membranes are moist.     Pharynx: Oropharynx is clear. Uvula midline.  Eyes:     General: Lids are normal.     Extraocular Movements: Extraocular movements intact.     Conjunctiva/sclera: Conjunctivae normal.     Pupils: Pupils are equal, round, and reactive to light.  Neck:     Thyroid: No thyroid mass, thyromegaly or thyroid tenderness.     Vascular: No carotid bruit.  Cardiovascular:     Rate and Rhythm: Normal rate and regular rhythm.     Pulses: Normal pulses.          Radial pulses are 2+ on the right side and 2+ on the left side.       Dorsalis pedis pulses are 2+ on the right side and 2+ on the left side.       Posterior tibial pulses are 2+ on the right side and 2+ on the left side.     Heart sounds: Normal heart sounds.  Pulmonary:     Effort: Pulmonary effort is normal.     Breath sounds: Normal breath sounds and air entry.  Abdominal:     General: Abdomen is flat. Bowel sounds are normal.     Palpations: Abdomen is soft.     Tenderness: There is no right CVA tenderness or left CVA tenderness.     Hernia: No hernia is present.  Musculoskeletal:        General: Normal range of motion.     Cervical back: Full passive range of motion without pain, normal range of motion and neck supple.     Right lower leg: No edema.     Left lower leg: No edema.     Comments: MAE, ROM intact   Feet:     Right foot:     Skin integrity: Skin integrity normal.     Left foot:     Skin integrity: Skin integrity normal.  Lymphadenopathy:     Cervical: No cervical adenopathy.  Skin:    General: Skin is warm and dry.     Capillary Refill: Capillary refill takes less than 2 seconds.    Neurological:     General: No focal deficit present.     Mental Status: She is alert and oriented to person, place, and time. Mental status is at baseline.     Cranial Nerves: Cranial nerves are intact.     Sensory: Sensation is intact.     Motor: Motor function is intact.     Coordination: Coordination is intact.     Gait: Gait is intact.     Deep Tendon Reflexes: Reflexes are normal and symmetric.  Psychiatric:        Attention and Perception: Attention and perception normal.        Mood and Affect: Mood and affect normal.        Speech: Speech normal.        Behavior: Behavior normal. Behavior is cooperative.        Thought Content: Thought content normal.        Cognition and Memory: Cognition and memory normal.        Judgment: Judgment normal.     Comments: Good communication and eye contact       Depression Screening  Depression screen [facility] 2/9 10/26/2019 07/27/2019 01/11/2018 10/05/2016  Decreased Interest 0 0 1 0  Down, Depressed, Hopeless 1 1 1  0  PHQ - 2 Score 1 1 2  0  Altered sleeping 1 - 2 -  Tired, decreased energy 1 - 2 -  Change in appetite 0 - 1 -  Feeling bad or failure about yourself  1 - 1 -  Trouble concentrating 0 - 0 -  Moving slowly or fidgety/restless 0 - 0 -  Suicidal thoughts 0 - 0 -  PHQ-9 Score 4 - 8 -  Difficult doing work/chores Somewhat difficult - Somewhat difficult -     Falls  Fall Risk  10/26/2019  Falls in the past year? 0  Number falls in past yr: 0  Injury with Fall? 0  Risk for fall due to : No Fall Risks  Follow up Falls evaluation completed    Assessment & Plan:   1. Annual visit for general adult medical examination with abnormal findings   2. Essential hypertension   3. Screening for diabetes mellitus   4. Overweight with body mass index (BMI) of 28 to 28.9 in adult     Tests ordered Orders Placed This Encounter  Procedures  . CBC  . COMPLETE METABOLIC PANEL WITH GFR  . Hemoglobin A1c  . Lipid panel  . TSH  Plan: Please see assessment and plan per problem list above.   No orders of the defined types were placed in this encounter.  I have personally reviewed: The patient's medical and social history Their use of alcohol, tobacco or illicit drugs Their current medications and supplements The patient's functional ability including ADLs,fall risks, home safety risks, cognitive, and hearing and visual impairment Diet and physical activities Evidence for depression or mood disorders  The patient's weight, height, BMI, and visual acuity have been recorded in the chart.  I have made referrals, counseling, and provided education to the patient based on review of the above and I have provided the patient with a written personalized care plan for preventive services.    Note: This dictation was prepared with Dragon dictation along with smaller phrase technology. Similar sounding words can be transcribed inadequately or may not be corrected upon review. Any transcriptional errors that result from this process are unintentional.      Perlie Mayo, NP   10/26/2019

## 2019-10-26 NOTE — Assessment & Plan Note (Signed)
A1c within prediabetic range. Encouraged to maintain a healthy diet low sugar

## 2019-10-26 NOTE — Assessment & Plan Note (Signed)
Discussed monthly self breast exams and yearly mammograms; at least 30 minutes of aerobic activity at least 5 days/week and weight-bearing exercise 2x/week; proper sunscreen use reviewed; healthy diet, including goals of calcium and vitamin D intake and alcohol recommendations (less than or equal to 1 drink/day) reviewed; regular seatbelt use; changing batteries in smoke detectors.  Immunization recommendations discussed.  Colonoscopy recommendations reviewed.  

## 2019-10-26 NOTE — Patient Instructions (Signed)
I appreciate the opportunity to provide you with care for your health and wellness. Today we discussed: overall health  Follow up: 6 months  Labs-fasting in next week No referrals today  Good Luck with choice on fibroids, keep me posted! In the mean time, have a wonderful SUMMER :)  Please continue to practice social distancing to keep you, your family, and our community safe.  If you must go out, please wear a mask and practice good handwashing.  It was a pleasure to see you and I look forward to continuing to work together on your health and well-being. Please do not hesitate to call the office if you need care or have questions about your care.  Have a wonderful day and week. With Gratitude, Cherly Beach, DNP, AGNP-BC

## 2019-11-14 ENCOUNTER — Encounter: Payer: Self-pay | Admitting: Family Medicine

## 2019-11-15 NOTE — Telephone Encounter (Signed)
Per Lab results this has already been taken care of and additional labs have been ordered

## 2019-11-17 ENCOUNTER — Telehealth: Payer: Self-pay

## 2019-11-17 DIAGNOSIS — D619 Aplastic anemia, unspecified: Secondary | ICD-10-CM

## 2019-11-17 DIAGNOSIS — D509 Iron deficiency anemia, unspecified: Secondary | ICD-10-CM

## 2019-11-17 LAB — COMPLETE METABOLIC PANEL WITH GFR
AG Ratio: 1.3 (calc) (ref 1.0–2.5)
ALT: 6 U/L (ref 6–29)
AST: 11 U/L (ref 10–30)
Albumin: 3.8 g/dL (ref 3.6–5.1)
Alkaline phosphatase (APISO): 42 U/L (ref 31–125)
BUN: 10 mg/dL (ref 7–25)
CO2: 27 mmol/L (ref 20–32)
Calcium: 8.7 mg/dL (ref 8.6–10.2)
Chloride: 103 mmol/L (ref 98–110)
Creat: 0.61 mg/dL (ref 0.50–1.10)
GFR, Est African American: 128 mL/min/{1.73_m2} (ref >=60)
GFR, Est Non African American: 110 mL/min/{1.73_m2} (ref >=60)
Globulin: 2.9 g/dL (calc) (ref 1.9–3.7)
Glucose, Bld: 89 mg/dL (ref 65–99)
Potassium: 3.5 mmol/L (ref 3.5–5.3)
Sodium: 136 mmol/L (ref 135–146)
Total Bilirubin: 0.3 mg/dL (ref 0.2–1.2)
Total Protein: 6.7 g/dL (ref 6.1–8.1)

## 2019-11-17 LAB — LIPID PANEL
Cholesterol: 188 mg/dL (ref <200)
HDL: 53 mg/dL (ref >=50)
LDL Cholesterol (Calc): 115 mg/dL (calc) — ABNORMAL HIGH
Non-HDL Cholesterol (Calc): 135 mg/dL (calc) — ABNORMAL HIGH (ref <130)
Total CHOL/HDL Ratio: 3.5 (calc) (ref <5.0)
Triglycerides: 101 mg/dL (ref <150)

## 2019-11-17 LAB — ADVANCED WRITTEN NOTIFICATION (AWN) TEST REFUSAL: AWN TEST REFUSED: 899

## 2019-11-17 LAB — FOLATE: Folate: 14.8 ng/mL

## 2019-11-17 LAB — CBC
HCT: 29.8 % — ABNORMAL LOW (ref 35.0–45.0)
Hemoglobin: 9.1 g/dL — ABNORMAL LOW (ref 11.7–15.5)
MCH: 21.6 pg — ABNORMAL LOW (ref 27.0–33.0)
MCHC: 30.5 g/dL — ABNORMAL LOW (ref 32.0–36.0)
MCV: 70.8 fL — ABNORMAL LOW (ref 80.0–100.0)
MPV: 10.3 fL (ref 7.5–12.5)
Platelets: 175 10*3/uL (ref 140–400)
RBC: 4.21 10*6/uL (ref 3.80–5.10)
RDW: 15.7 % — ABNORMAL HIGH (ref 11.0–15.0)
WBC: 5.6 10*3/uL (ref 3.8–10.8)

## 2019-11-17 LAB — VITAMIN B12: Vitamin B-12: 242 pg/mL (ref 200–1100)

## 2019-11-17 LAB — TEST AUTHORIZATION

## 2019-11-17 LAB — IRON: Iron: 21 ug/dL — ABNORMAL LOW (ref 40–190)

## 2019-11-17 LAB — HEMOGLOBIN A1C
Hgb A1c MFr Bld: 5.4 % of total Hgb (ref <5.7)
Mean Plasma Glucose: 108 (calc)
eAG (mmol/L): 6 (calc)

## 2019-11-17 NOTE — Telephone Encounter (Signed)
-----   Message from Perlie Mayo, NP sent at 11/17/2019  8:48 AM EDT ----- So the changes with her blood counts are related to low iron levels.  She needs to start taking 325 mg of oral iron 3 times daily.  Repeat test in 12 weeks.  If she has any excessive bleeding or changes advised her to let us know.  Please let her know that iron will make her stools dark.  And if she is not eating a well-balanced diet or drinking enough water it can make her constipated.

## 2020-01-16 ENCOUNTER — Other Ambulatory Visit: Payer: Self-pay | Admitting: *Deleted

## 2020-01-16 ENCOUNTER — Encounter: Payer: Self-pay | Admitting: Family Medicine

## 2020-01-16 DIAGNOSIS — I10 Essential (primary) hypertension: Secondary | ICD-10-CM

## 2020-01-16 MED ORDER — AMLODIPINE BESYLATE 10 MG PO TABS: 10.0000 mg | ORAL_TABLET | Freq: Every day | ORAL | 1 refills | Status: DC

## 2020-04-17 ENCOUNTER — Other Ambulatory Visit: Payer: Self-pay

## 2020-04-17 ENCOUNTER — Encounter: Payer: Self-pay | Admitting: Family Medicine

## 2020-04-17 DIAGNOSIS — I10 Essential (primary) hypertension: Secondary | ICD-10-CM

## 2020-04-17 MED ORDER — AMLODIPINE BESYLATE 10 MG PO TABS: 10.0000 mg | ORAL_TABLET | Freq: Every day | ORAL | 1 refills | Status: DC

## 2020-04-26 ENCOUNTER — Other Ambulatory Visit: Payer: Self-pay

## 2020-04-26 ENCOUNTER — Encounter: Payer: Self-pay | Admitting: Family Medicine

## 2020-04-26 ENCOUNTER — Ambulatory Visit (INDEPENDENT_AMBULATORY_CARE_PROVIDER_SITE_OTHER): Payer: BC Managed Care – PPO | Admitting: Family Medicine

## 2020-04-26 VITALS — BP 144/92 | HR 86 | Resp 15 | Ht 65.0 in | Wt 176.0 lb

## 2020-04-26 DIAGNOSIS — I10 Essential (primary) hypertension: Secondary | ICD-10-CM

## 2020-04-26 DIAGNOSIS — E663 Overweight: Secondary | ICD-10-CM

## 2020-04-26 DIAGNOSIS — D250 Submucous leiomyoma of uterus: Secondary | ICD-10-CM

## 2020-04-26 DIAGNOSIS — Z6829 Body mass index (BMI) 29.0-29.9, adult: Secondary | ICD-10-CM

## 2020-04-26 MED ORDER — AMLODIPINE BESYLATE 10 MG PO TABS: 10.0000 mg | ORAL_TABLET | Freq: Every day | ORAL | 1 refills | Status: DC

## 2020-04-26 NOTE — Assessment & Plan Note (Signed)
She has better control when she takes her medication as demonstrated at other visits. She wondered if she could stop it and BP be ok- so she did not take her dose this am. However BP is elevated and she is encouraged to continue it for now. Could consider a different BC as some people will have higher BP with certain ones. She has put on a little weight, so exercise and diet control reviewed in office today. As well as stress reduction.

## 2020-04-26 NOTE — Assessment & Plan Note (Signed)
Advised to f/u with Dr Johnnye Sima office to see what the plans can be due to heavy flows.

## 2020-04-26 NOTE — Patient Instructions (Addendum)
  I appreciate the opportunity to provide you with care for your health and wellness.  Follow up: 6 months for BP (sooner if needed)  No labs or referrals today  Please please take your BP medication for now. We can revisit this in future, but it does appear to be that you do need it for now.  Consider change in birth control as this can sometimes cause high BP in some people.  Avoid salt, drink plenty of water, rest, exercise and manage anxiety.  HAPPY BELATED BIRTHDAY :)  Please continue to practice social distancing to keep you, your family, and our community safe.  If you must go out, please wear a mask and practice good handwashing.  It was a pleasure to see you and I look forward to continuing to work together on your health and well-being. Please do not hesitate to call the office if you need care or have questions about your care.  Have a wonderful day. With Gratitude, Cherly Beach, DNP, AGNP-BC

## 2020-04-26 NOTE — Assessment & Plan Note (Signed)
Deteriorated   Margaret Owen is re-educated about the importance of exercise daily to help with weight management. A minumum of 30 minutes daily is recommended. Additionally, importance of healthy food choices  with portion control discussed.  Wt Readings from Last 3 Encounters:  04/26/20 176 lb (79.8 kg)  10/26/19 164 lb 6.4 oz (74.6 kg)  10/02/19 165 lb 3.2 oz (74.9 kg)

## 2020-04-26 NOTE — Progress Notes (Signed)
Subjective:  Patient ID: Margaret Owen, female    DOB: February 20, 1976  Age: 45 y.o. MRN: [id number]  CC:  Chief Complaint  Patient presents with  . Hypertension    Follow up visit       HPI  HPI  Margaret Owen is a pleasant 45 year old female patient of mine. She presents today for follow up on BP. Limited exercise, reports she could be doing more, has put on some weight in last 6 months. Trying to be more mindful with food and salt intake.  Cardiac symptoms: none. Patient denies: chest pain, chest pressure/discomfort, claudication, dyspnea, exertional chest pressure/discomfort, fatigue, irregular heart beat, lower extremity edema, near-syncope, orthopnea, palpitations, paroxysmal nocturnal dyspnea, syncope and tachypnea. Cardiovascular risk factors: hypertension and sedentary lifestyle. Use of agents associated with hypertension: oral contraceptives. History of target organ damage: none.  She has better control when she takes her medication as demonstrated at other visits. She wondered if she could stop it and BP be ok- so she did not take her dose this am. However BP is elevated, 144/92. She is on [facility] and that could be causing her BP to be higher even with the lifestyle changes she has done. She also reports stress increased with family items.  Today patient denies signs and symptoms of COVID 19 infection including fever, chills, cough, shortness of breath, and headache. Past Medical, Surgical, Social History, Allergies, and Medications have been Reviewed.   Past Medical History:  Diagnosis Date  . Breakthrough bleeding associated with intrauterine device (IUD) 03/22/2017  . Hypertension   . IUD contraception 10/05/2016  . Malpositioned IUD 04/14/2017   REmoved 04/14/17   . MIGRAINES, HX OF 06/11/2008   Qualifier: Diagnosis of  By: Truett Mainland MD, Christine    . Pedal edema 10/05/2016    Current Meds  Medication Sig  . norelgestromin-ethinyl estradiol (ORTHO EVRA) 150-35 MCG/24HR  transdermal patch Place 1 patch onto the skin once a week.  . [DISCONTINUED] amLODipine (NORVASC) 10 MG tablet Take 1 tablet (10 mg total) by mouth daily.    ROS:  Review of Systems  HENT: Negative.   Eyes: Negative.   Respiratory: Negative.   Cardiovascular: Negative.   Gastrointestinal: Negative.   Genitourinary: Negative.   Musculoskeletal: Negative.   Skin: Negative.   Neurological: Negative.   Endo/Heme/Allergies: Negative.   Psychiatric/Behavioral: Negative.      Objective:   Today's Vitals: BP (!) 144/92   Pulse 86   Resp 15   Ht 5\' 5"  (1.651 m)   Wt 176 lb (79.8 kg)   SpO2 98%   BMI 29.29 kg/m  Vitals with BMI 04/26/2020 10/26/2019 10/02/2019  Height 5\' 5"  5\' 4"  5\' 5"   Weight 176 lbs 164 lbs 6 oz 165 lbs 3 oz  BMI 29.29 62.37 62.83  Systolic 151 761 607  Diastolic 92 80 69  Pulse 86 83 80     Physical Exam Vitals and nursing note reviewed.  Constitutional:      Appearance: Normal appearance. She is well-developed, well-groomed and overweight.  HENT:     Head: Normocephalic and atraumatic.     Right Ear: External ear normal.     Left Ear: External ear normal.     Nose: Nose normal.     Mouth/Throat:     Mouth: Mucous membranes are moist.     Pharynx: Oropharynx is clear.  Eyes:     General:        Right eye: No discharge.  Left eye: No discharge.     Conjunctiva/sclera: Conjunctivae normal.  Cardiovascular:     Rate and Rhythm: Normal rate and regular rhythm.     Pulses: Normal pulses.     Heart sounds: Normal heart sounds.  Pulmonary:     Effort: Pulmonary effort is normal.     Breath sounds: Normal breath sounds.  Musculoskeletal:        General: Normal range of motion.     Cervical back: Normal range of motion and neck supple.  Skin:    General: Skin is warm.  Neurological:     General: No focal deficit present.     Mental Status: She is alert and oriented to person, place, and time.  Psychiatric:        Attention and Perception:  Attention normal.        Mood and Affect: Mood normal.        Speech: Speech normal.        Behavior: Behavior normal. Behavior is cooperative.        Thought Content: Thought content normal.        Cognition and Memory: Cognition normal.        Judgment: Judgment normal.    Assessment   1. Essential hypertension   2. Overweight with body mass index (BMI) of 29 to 29.9 in adult   3. Fibroids, submucosal     Tests ordered No orders of the defined types were placed in this encounter.    Plan: Please see assessment and plan per problem list above.   Meds ordered this encounter  Medications  . amLODipine (NORVASC) 10 MG tablet    Sig: Take 1 tablet (10 mg total) by mouth daily.    Dispense:  90 tablet    Refill:  1    Order Specific Question:   Supervising Provider    Answer:   Fayrene Helper [[id number]]    Patient to follow-up in 6 months or as needed   Perlie Mayo, NP

## 2020-04-30 ENCOUNTER — Ambulatory Visit: Payer: BC Managed Care – PPO | Admitting: Family Medicine

## 2020-05-22 ENCOUNTER — Other Ambulatory Visit: Payer: Self-pay

## 2020-05-22 ENCOUNTER — Encounter: Payer: Self-pay | Admitting: Family Medicine

## 2020-05-22 ENCOUNTER — Encounter: Payer: Self-pay | Admitting: Internal Medicine

## 2020-05-22 ENCOUNTER — Telehealth (INDEPENDENT_AMBULATORY_CARE_PROVIDER_SITE_OTHER): Payer: BC Managed Care – PPO | Admitting: Internal Medicine

## 2020-05-22 DIAGNOSIS — J3089 Other allergic rhinitis: Secondary | ICD-10-CM | POA: Diagnosis not present

## 2020-05-22 DIAGNOSIS — J309 Allergic rhinitis, unspecified: Secondary | ICD-10-CM | POA: Insufficient documentation

## 2020-05-22 MED ORDER — LEVOCETIRIZINE DIHYDROCHLORIDE 5 MG PO TABS: 5.0000 mg | ORAL_TABLET | Freq: Every evening | ORAL | 2 refills | Status: DC

## 2020-05-22 MED ORDER — FLUTICASONE PROPIONATE 50 MCG/ACT NA SUSP: 2.0000 | Freq: Every day | NASAL | 2 refills | Status: DC

## 2020-05-22 NOTE — Progress Notes (Unsigned)
Virtual Visit via Video Note   This visit type was conducted due to national recommendations for restrictions regarding the COVID-19 Pandemic (e.g. social distancing) in an effort to limit this patient's exposure and mitigate transmission in our community.  Due to her co-morbid illnesses, this patient is at least at moderate risk for complications without adequate follow up.  This format is felt to be most appropriate for this patient at this time.  All issues noted in this document were discussed and addressed.  A limited physical exam was performed with this format.     Evaluation Performed:  Follow-up visit  Date:  05/22/2020   ID:  Margaret Owen, DOB [birth year], MRN [id number]  Patient Location: Home Provider Location: Office/Clinic  Participants: Patient Location of Patient: Home Location of Provider: Telehealth Consent was obtain for visit to be over via telehealth. I verified that I am speaking with the correct person using two identifiers.  PCP:  Perlie Mayo, NP   Chief Complaint:  Runny nose and yellowish nasal discharge  History of Present Illness:    Margaret Owen is a 45 y.o. female who has a televisit for c/o runny nose and yellowish nasal discharge, intermittent for last 4 months.  She c/o intermittent nasal congestion and stuffy nose. She has noticed yellowish mucus nasal drainage recently. She denies any fever, chills, sore throat, sinus pain or pressure, or change in taste or smell sensation. Denies any recent sick contacts. Denies any dyspnea or wheezing. She has been using Afrin nasal spray for nasal congestion with mild relief.  The patient does not have symptoms concerning for COVID-19 infection (fever, chills, cough, or new shortness of breath).   Past Medical, Surgical, Social History, Allergies, and Medications have been Reviewed.  Past Medical History:  Diagnosis Date  . Breakthrough bleeding associated with intrauterine device (IUD)  03/22/2017  . Hypertension   . IUD contraception 10/05/2016  . Malpositioned IUD 04/14/2017   REmoved 04/14/17   . MIGRAINES, HX OF 06/11/2008   Qualifier: Diagnosis of  By: Truett Mainland MD, Christine    . Pedal edema 10/05/2016   Past Surgical History:  Procedure Laterality Date  . CERVICAL CERCLAGE    . CESAREAN SECTION     2000,2003,2005  . CESAREAN SECTION  08/17/1998     Current Meds  Medication Sig  . amLODipine (NORVASC) 10 MG tablet Take 1 tablet (10 mg total) by mouth daily.  . fluticasone (FLONASE) 50 MCG/ACT nasal spray Place 2 sprays into both nostrils daily.  Marland Kitchen levocetirizine (XYZAL) 5 MG tablet Take 1 tablet (5 mg total) by mouth every evening.  . norelgestromin-ethinyl estradiol (ORTHO EVRA) 150-35 MCG/24HR transdermal patch Place 1 patch onto the skin once a week.     Allergies:   Patient has no known allergies.   ROS:   Please see the history of present illness.     All other systems reviewed and are negative.   Labs/Other Tests and Data Reviewed:    Recent Labs: 11/14/2019: ALT 6; BUN 10; Creat 0.61; Hemoglobin 9.1; Platelets 175; Potassium 3.5; Sodium 136   Recent Lipid Panel Lab Results  Component Value Date/Time   CHOL 188 11/14/2019 07:33 AM   TRIG 101 11/14/2019 07:33 AM   HDL 53 11/14/2019 07:33 AM   CHOLHDL 3.5 11/14/2019 07:33 AM   LDLCALC 115 (H) 11/14/2019 07:33 AM    Wt Readings from Last 3 Encounters:  04/26/20 176 lb (79.8 kg)  10/26/19 164 lb 6.4 oz (  74.6 kg)  10/02/19 165 lb 3.2 oz (74.9 kg)      ASSESSMENT & PLAN:    Allergic rhinitis Long-standing symptoms more consistent with allergic etiology Started Xyzal and Flonase Advised to use humidifier at nighttime to avoid dry air Nasal saline spray for nasal congestion Advised to contact if persistent or worsening symptoms    Time:   Today, I have spent 11 minutes reviewing the chart, including problem list, medications, and with the patient with telehealth technology discussing the  above problems.   Medication Adjustments/Labs and Tests Ordered: Current medicines are reviewed at length with the patient today.  Concerns regarding medicines are outlined above.   Tests Ordered: No orders of the defined types were placed in this encounter.   Medication Changes: Meds ordered this encounter  Medications  . levocetirizine (XYZAL) 5 MG tablet    Sig: Take 1 tablet (5 mg total) by mouth every evening.    Dispense:  30 tablet    Refill:  2  . fluticasone (FLONASE) 50 MCG/ACT nasal spray    Sig: Place 2 sprays into both nostrils daily.    Dispense:  16 g    Refill:  2     Note: This dictation was prepared with Dragon dictation along with smaller phrase technology. Similar sounding words can be transcribed inadequately or may not be corrected upon review. Any transcriptional errors that result from this process are unintentional.      Disposition:  Follow up  Signed, Lindell Spar, MD  05/22/2020 3:09 PM     [place]

## 2020-05-22 NOTE — Patient Instructions (Signed)
Please start taking Xyzal and use Flonase as prescribed.  Please use Ocean spray for nasal congestion.  Please contact if you have any new symptoms such as fever, chills, sore throat, sinus pain or change in mucous quality.

## 2020-06-19 ENCOUNTER — Other Ambulatory Visit: Payer: Self-pay

## 2020-06-19 ENCOUNTER — Encounter: Payer: Self-pay | Admitting: Family Medicine

## 2020-06-19 DIAGNOSIS — Z1211 Encounter for screening for malignant neoplasm of colon: Secondary | ICD-10-CM

## 2020-06-19 NOTE — Telephone Encounter (Signed)
Yes most def!

## 2020-06-24 ENCOUNTER — Encounter: Payer: Self-pay | Admitting: Internal Medicine

## 2020-07-23 ENCOUNTER — Other Ambulatory Visit: Payer: Self-pay

## 2020-07-23 ENCOUNTER — Ambulatory Visit (INDEPENDENT_AMBULATORY_CARE_PROVIDER_SITE_OTHER): Payer: Self-pay | Admitting: *Deleted

## 2020-07-23 VITALS — Ht 65.0 in | Wt 174.6 lb

## 2020-07-23 DIAGNOSIS — Z1211 Encounter for screening for malignant neoplasm of colon: Secondary | ICD-10-CM

## 2020-07-23 MED ORDER — CLENPIQ 10-3.5-12 MG-GM -GM/160ML PO SOLN: 1.0000 | Freq: Once | ORAL | 0 refills | Status: AC

## 2020-07-23 NOTE — Patient Instructions (Addendum)
Covid test: 08/21/2020 at 8:05 AM.  [place]   Patient Name:  Margaret Owen Date of procedure: 08/23/2020 Time to register at [facility] Stay: You will receive a call from the hospital a few days before your procedure. Provider:  Dr. Abbey Chatters  Please notify us immediately if you are diabetic, take iron supplements, or if you are on coumadin or any blood thinners.  Please hold the following medications: See letter  Note: Do NOT refrigerate or freeze CLENPIQ. CLENPIQ is ready to drink. There is no need to add any other liquid or mix the medicine in the bottle before you start dosing.   08/22/2020-1 Day prior to procedure:     CLEAR LIQUIDS ALL DAY--NO SOLID FOODS OR DAIRY PRODUCTS! See list of liquids that are allowed and items that are NOT allowed below.   Diabetic Medication Instructions:  n/a   You must drink plenty of CLEAR LIQUIDS starting before your bowel prep. It is important to stay adequately hydrated before, during, and after your bowel prep for the prep to work effectively!   At 4:00 PM Begin the prep as follows:    1. Drink one bottle of pre-mixed CLENPIQ right from the bottle.  2. Drink at least five (5) 8-ounce drinks of clear liquids of your choice within the next 5 hours   At 10:00 PM: 1. Drink the second bottle of pre-mixed CLENPIQ right from the bottle.   2. Drink at least three (3) 8-ounce drinks of clear liquids of your choice within the next 3 hours before going to bed.   Continue clear liquids until midnight.  Nothing by mouth after midnight.  08/23/2020- Day of Procedure:   Diabetic medications adjustments:  n/a   You may take TYLENOL products.  Please continue your regular medications unless we have instructed you otherwise.    Please note, on the day of your procedure you MUST be accompanied by an adult who is willing to assume responsibility for you at time of discharge. If you do not have such person with you, your procedure will  have to be rescheduled.                                                                                                                     Please leave ALL jewelry at home prior to coming to the hospital for your procedure.   *It is your responsibility to check with your insurance company for the benefits of coverage you have for this procedure. Unfortunately, not all insurance companies have benefits to cover all or part of these types of procedures. It is your responsibility to check your benefits, however we will be glad to assist you with any codes your insurance company may need.   Please note that most insurance companies will not cover a screening colonoscopy for people under the age of 51  For example, with some insurance companies you may have benefits for a screening colonoscopy, but if polyps are found the diagnosis will  change and then you may have a deductible that will need to be met. Please make sure you check your benefits for screening colonoscopy as well as a diagnostic colonoscopy.    CLEAR LIQUIDS: (NO RED or PURPLE) Water  Jello   Apple Juice  White Grape Juice   Kool-Aid Soft drinks  Banana popsicles Sports Drink  Black coffee (No cream or milk) Tea (No cream or milk)  Broth (fat free beef/chicken/vegetable)  Clear liquids allow you to see your fingers on the other side of the glass.  Be sure they are NOT RED or PURPLE in color, cloudy, but CLEAR.  Do Not Eat: Dairy products of any kind Cranberry juice Tomato or V8 Juice  Orange Juice   Grapefruit Juice Red Grape Juice Alcohol   Non-dairy creamer Solid foods like cereal, oatmeal, yogurt, fruits, vegetables, creamed soups, eggs, bread, etc   HELPFUL HINTS TO MAKE DRINKING EASIER: -Trying drinking through a straw. -If you become nauseated, try consuming smaller amounts or stretch out the time between glasses.  Stop for 30 minutes & slowly start back drinking.  Call our office with any questions or concerns at  [phone].  Thank You,  Christ Kick, [place]

## 2020-07-23 NOTE — Progress Notes (Signed)
Looks like patient was found to have anemia with low iron in July 2021.  She was recommended to start iron 3 times daily at that time.  I do not see iron listed on her medication list.  As far as I can tell, she has not had labs updated since then.  Additionally, in the setting of IDA, would recommend office visit to discuss this further as we may also need to pursue EGD.

## 2020-07-23 NOTE — Progress Notes (Signed)
Gastroenterology Pre-Procedure Review  Request Date: 07/23/2020 Requesting Physician: Cherly Beach, NP @ [facility], no previous TCS, family hx of colon cancer (aunt)  PATIENT REVIEW QUESTIONS: The patient responded to the following health history questions as indicated:    1. Diabetes Melitis: no 2. Joint replacements in the past 12 months: no 3. Major health problems in the past 3 months: no 4. Has an artificial valve or MVP: no 5. Has a defibrillator: no 6. Has been advised in past to take antibiotics in advance of a procedure like teeth cleaning: no 7. Family history of colon cancer: yes, maternal aunt: age 36's or 12's  8. Alcohol Use: yes, 1 mixed drink a month 9. Illicit drug Use: no 10. History of sleep apnea: no 11. History of coronary artery or other vascular stents placed within the last 12 months: no 12. History of any prior anesthesia complications: no 13. Body mass index is 29.05 kg/m.    MEDICATIONS & ALLERGIES:    Patient reports the following regarding taking any blood thinners:   Plavix? no Aspirin? no Coumadin? no Brilinta? no Xarelto? no Eliquis? no Pradaxa? no Savaysa? no Effient? no  Patient confirms/reports the following medications:  Current Outpatient Medications  Medication Sig Dispense Refill  . amLODipine (NORVASC) 10 MG tablet Take 1 tablet (10 mg total) by mouth daily. 90 tablet 1  . fluticasone (FLONASE) 50 MCG/ACT nasal spray Place 2 sprays into both nostrils daily. 16 g 2  . levocetirizine (XYZAL) 5 MG tablet Take 1 tablet (5 mg total) by mouth every evening. 30 tablet 2  . norelgestromin-ethinyl estradiol (ORTHO EVRA) 150-35 MCG/24HR transdermal patch Place 1 patch onto the skin once a week.     No current facility-administered medications for this visit.    Patient confirms/reports the following allergies:  No Known Allergies  No orders of the defined types were placed in this encounter.   AUTHORIZATION  INFORMATION Primary Insurance: [facility],  [place] #: [id number],  Group #: [id number] Pre-Cert / Josem Kaufmann required: No, not required  SCHEDULE INFORMATION: Procedure has been scheduled as follows:  Date: 08/23/2020, Time: AM procedure Location: [facility] with Dr. Abbey Chatters  This Gastroenterology Pre-Precedure Review Form is being routed to the following provider(s): Aliene Altes, PA

## 2020-07-24 ENCOUNTER — Telehealth: Payer: Self-pay | Admitting: *Deleted

## 2020-07-24 NOTE — Telephone Encounter (Signed)
Lmom for pt to call me back.  Pt needs ov with provider to arrange TCS.

## 2020-07-25 NOTE — Progress Notes (Signed)
Spoke to pt.  She was informed that she needs ov due to IDA and we may need to pursue an EGD.  Pt aware that her procedure is being cancelled and will be rescheduled at her ov.  She scheduled ov for 08/28/2020 at 3:30 with Neil Crouch, PA.  Pt voiced understanding.

## 2020-08-15 ENCOUNTER — Ambulatory Visit: Payer: BC Managed Care – PPO | Admitting: Nurse Practitioner

## 2020-08-21 ENCOUNTER — Other Ambulatory Visit (HOSPITAL_COMMUNITY): Payer: BC Managed Care – PPO

## 2020-08-23 ENCOUNTER — Other Ambulatory Visit: Payer: Self-pay | Admitting: Internal Medicine

## 2020-08-23 DIAGNOSIS — J3089 Other allergic rhinitis: Secondary | ICD-10-CM

## 2020-08-28 ENCOUNTER — Ambulatory Visit (INDEPENDENT_AMBULATORY_CARE_PROVIDER_SITE_OTHER): Payer: BC Managed Care – PPO | Admitting: Gastroenterology

## 2020-08-28 ENCOUNTER — Encounter: Payer: Self-pay | Admitting: Gastroenterology

## 2020-08-28 ENCOUNTER — Other Ambulatory Visit: Payer: Self-pay

## 2020-08-28 VITALS — BP 132/81 | HR 86 | Temp 97.3°F | Ht 65.0 in | Wt 174.8 lb

## 2020-08-28 DIAGNOSIS — Z1211 Encounter for screening for malignant neoplasm of colon: Secondary | ICD-10-CM | POA: Diagnosis not present

## 2020-08-28 DIAGNOSIS — D500 Iron deficiency anemia secondary to blood loss (chronic): Secondary | ICD-10-CM

## 2020-08-28 NOTE — Progress Notes (Signed)
CC'ED TO PCP 

## 2020-08-28 NOTE — Progress Notes (Signed)
Primary Care Physician:  Perlie Mayo, NP  Primary Gastroenterologist:  Elon Alas. Abbey Chatters, DO   Chief Complaint  Patient presents with  . Anemia    Consult TCS. Never done prior. Wonders if related to her fibroids because she has heavy periods for longer periods of time    HPI:  Margaret Owen is a 45 y.o. female here at the request of Cherly Beach, NP for screening colonoscopy.  Patient has never had a colonoscopy.  Her maternal aunt had a history of colon cancer.  Patient denies any GI symptoms.  She takes her iron off and on because she tends to have constipation with it.  Denies any melena or rectal bleeding.  No upper GI symptoms.  Appetite is good.  She has had anemia for several years.  States she was diagnosed with uterine fibroids around 2018, has been anemic since that time due to heavy menses.  Overdue for follow-up with GYN.  For the past 3 months she has had heavier bleeding, cycles lasting for longer periods of time.  She has been having vaginal bleeding for 4 weeks at this point.  .   Current Outpatient Medications  Medication Sig Dispense Refill  . amLODipine (NORVASC) 10 MG tablet Take 1 tablet (10 mg total) by mouth daily. 90 tablet 1  . Ferrous Sulfate (IRON PO) Take by mouth. Once a day    . fluticasone (FLONASE) 50 MCG/ACT nasal spray SHAKE LIQUID AND USE 2 SPRAYS IN EACH NOSTRIL DAILY 16 g 2  . levocetirizine (XYZAL) 5 MG tablet TAKE 1 TABLET(5 MG) BY MOUTH EVERY EVENING 30 tablet 2  . norelgestromin-ethinyl estradiol (ORTHO EVRA) 150-35 MCG/24HR transdermal patch Place 1 patch onto the skin once a week.     No current facility-administered medications for this visit.    Allergies as of 08/28/2020  . (No Known Allergies)    Past Medical History:  Diagnosis Date  . Breakthrough bleeding associated with intrauterine device (IUD) 03/22/2017  . Hypertension   . IUD contraception 10/05/2016  . Malpositioned IUD 04/14/2017   REmoved 04/14/17   . MIGRAINES,  HX OF 06/11/2008   Qualifier: Diagnosis of  By: Truett Mainland MD, Christine    . Pedal edema 10/05/2016    Past Surgical History:  Procedure Laterality Date  . CERVICAL CERCLAGE    . CESAREAN SECTION     2000,2003,2005  . CESAREAN SECTION  08/17/1998    Family History  Problem Relation Age of Onset  . Heart failure Father   . Alcohol abuse Father   . Heart disease Father 48       died at 78  . Hypertension Father   . Stroke Mother   . Hypertension Mother   . Arthritis Mother   . Asthma Mother   . Diabetes Mother   . Hyperlipidemia Mother   . Glaucoma Mother   . Breast cancer Maternal Aunt   . Colon cancer Maternal Aunt   . Stroke Maternal Aunt   . Glaucoma Sister   . Asthma Brother     Social History   Socioeconomic History  . Marital status: Married    Spouse name: Harrell Gave  . Number of children: 3  . Years of education: 80  . Highest education level: Not on file  Occupational History  . Occupation: Artist    Comment: [employer] [place]  Tobacco Use  . Smoking status: Never Smoker  . Smokeless tobacco: Never Used  Vaping Use  . Vaping Use: Never  used  Substance and Sexual Activity  . Alcohol use: Yes    Comment: occasional  . Drug use: No  . Sexual activity: Yes    Birth control/protection: Patch  Other Topics Concern  . Not on file  Social History Narrative   Husband is a [place] with husband Christopher-married 24 years   Three daughters: all live at home 55, 44,15    Works for [employer], Art gallery manager      Dog: Bandit       Enjoys: gardening, flowers      Diet: eat all food groups   Caffeine: 2 daily   Water: 2 bottles daily       Wears seat belt    Does not use phone while driving    Oceanographer at home    Social Determinants of Radio broadcast assistant Strain: Not on file  Food Insecurity: Not on file  Transportation Needs: Not on file  Physical Activity: Not on file  Stress: Not on file  Social Connections: Not on  file  Intimate Partner Violence: Not on file      ROS:  General: Negative for anorexia, weight loss, fever, chills, fatigue, weakness. Eyes: Negative for vision changes.  ENT: Negative for hoarseness, difficulty swallowing , nasal congestion. CV: Negative for chest pain, angina, palpitations, dyspnea on exertion, peripheral edema.  Respiratory: Negative for dyspnea at rest, dyspnea on exertion, cough, sputum, wheezing.  GI: See history of present illness. GU:  Negative for dysuria, hematuria, urinary incontinence, urinary frequency, nocturnal urination.  See HPI MS: Negative for joint pain, low back pain.  Derm: Negative for rash or itching.  Neuro: Negative for weakness, abnormal sensation, seizure, frequent headaches, memory loss, confusion.  Psych: Negative for anxiety, depression, suicidal ideation, hallucinations.  Endo: Negative for unusual weight change.  Heme: Negative for bruising or bleeding. Allergy: Negative for rash or hives.    Physical Examination:  BP 132/81   Pulse 86   Temp (!) 97.3 F (36.3 C)   Ht 5\' 5"  (1.651 m)   Wt 174 lb 12.8 oz (79.3 kg)   LMP 07/28/2020   BMI 29.09 kg/m    General: Well-nourished, well-developed in no acute distress.  Head: Normocephalic, atraumatic.   Eyes: Conjunctiva pink, no icterus. Mouth:masked Neck: Supple without thyromegaly, masses, or lymphadenopathy.  Lungs: Clear to auscultation bilaterally.  Heart: Regular rate and rhythm, no murmurs rubs or gallops.  Abdomen: Bowel sounds are normal, nontender, nondistended, no hepatosplenomegaly or masses, no abdominal bruits or    hernia , no rebound or guarding.   Rectal: not performed Extremities: No lower extremity edema. No clubbing or deformities.  Neuro: Alert and oriented x 4 , grossly normal neurologically.  Skin: Warm and dry, no rash or jaundice.   Psych: Alert and cooperative, normal mood and affect.  Labs: Lab Results  Component Value Date   CREATININE 0.61  11/14/2019   BUN 10 11/14/2019   NA 136 11/14/2019   K 3.5 11/14/2019   CL 103 11/14/2019   CO2 27 11/14/2019   Lab Results  Component Value Date   ALT 6 11/14/2019   AST 11 11/14/2019   ALKPHOS 51 02/24/2018   BILITOT 0.3 11/14/2019   Lab Results  Component Value Date   WBC 5.6 11/14/2019   HGB 9.1 (L) 11/14/2019   HCT 29.8 (L) 11/14/2019   MCV 70.8 (L) 11/14/2019   PLT 175 11/14/2019   Lab Results  Component Value Date  IRON 21 (L) 11/14/2019   TIBC 530 (H) 07/27/2019   FERRITIN 2 (L) 07/27/2019   Lab Results  Component Value Date   FOLATE 14.8 11/14/2019   Lab Results  Component Value Date   VITAMINB12 242 11/14/2019     Imaging Studies: No results found.   Assessment:  Pleasant 45 year old female with family history of colon cancer in one second-degree relative presenting for screening colonoscopy.  Denies any GI symptoms.  She has a history of chronic iron deficiency anemia which she contributes to uterine fibroids with heavy menstrual cycles.  Patient denies upper GI symptoms.  No aspirin or NSAID use.  Plan: 1. Colonoscopy in the near future with Dr. Abbey Chatters. ASA II.  I have discussed the risks, alternatives, benefits with regards to but not limited to the risk of reaction to medication, bleeding, infection, perforation and the patient is agreeable to proceed. Written consent to be obtained. 2. Update CBC, iron/TIBC/ferritin 3. Recommend she follow-up with GYN regarding heavy menstrual flow.

## 2020-08-28 NOTE — Patient Instructions (Signed)
1. Update labs at your convenience. 2. Colonoscopy in the near future.  See separate instructions.

## 2020-09-11 ENCOUNTER — Encounter: Payer: Self-pay | Admitting: Family Medicine

## 2020-09-12 ENCOUNTER — Telehealth (INDEPENDENT_AMBULATORY_CARE_PROVIDER_SITE_OTHER): Payer: BC Managed Care – PPO | Admitting: Internal Medicine

## 2020-09-12 ENCOUNTER — Encounter (HOSPITAL_COMMUNITY): Payer: Self-pay

## 2020-09-12 ENCOUNTER — Other Ambulatory Visit: Payer: Self-pay

## 2020-09-12 ENCOUNTER — Encounter: Payer: Self-pay | Admitting: Internal Medicine

## 2020-09-12 DIAGNOSIS — D509 Iron deficiency anemia, unspecified: Secondary | ICD-10-CM | POA: Diagnosis not present

## 2020-09-12 DIAGNOSIS — D251 Intramural leiomyoma of uterus: Secondary | ICD-10-CM | POA: Diagnosis not present

## 2020-09-12 DIAGNOSIS — Z1159 Encounter for screening for other viral diseases: Secondary | ICD-10-CM

## 2020-09-12 DIAGNOSIS — D250 Submucous leiomyoma of uterus: Secondary | ICD-10-CM

## 2020-09-12 DIAGNOSIS — Z114 Encounter for screening for human immunodeficiency virus [HIV]: Secondary | ICD-10-CM

## 2020-09-12 NOTE — Telephone Encounter (Signed)
Pt scheduled virtual visit with patel 09-12-20

## 2020-09-12 NOTE — Patient Instructions (Signed)
Please get blood tests done within a week.  Please continue to take medications as prescribed.  Please follow up with Ob/Gyn for management of fibroid.

## 2020-09-12 NOTE — Progress Notes (Signed)
Virtual Visit via Telephone Note   This visit type was conducted due to national recommendations for restrictions regarding the COVID-19 Pandemic (e.g. social distancing) in an effort to limit this patient's exposure and mitigate transmission in our community.  Due to her co-morbid illnesses, this patient is at least at moderate risk for complications without adequate follow up.  This format is felt to be most appropriate for this patient at this time.  The patient did not have access to video technology/had technical difficulties with video requiring transitioning to audio format only (telephone).  All issues noted in this document were discussed and addressed.  No physical exam could be performed with this format.  Evaluation Performed:  Follow-up visit  Date:  09/12/2020   ID:  Margaret Owen, DOB [birth year], MRN [id number]  Patient Location: Home Provider Location: Office/Clinic  Participants: Patient Location of Patient: Home Location of Provider: Telehealth Consent was obtain for visit to be over via telehealth. I verified that I am speaking with the correct person using two identifiers.  PCP:  Perlie Mayo, NP   Chief Complaint:  Anemia follow up  History of Present Illness:    Margaret Owen is a 45 y.o. female who has a televisit for follow up of anemia. She has been taking iron supplements once to twice daily as TID dose was causing severe constipation. She continues to have heavy menstrual bleeding and is planning to see Ob/Gyn again for evaluation of fibroid. Denies any dizziness or dyspnea.  The patient does not have symptoms concerning for COVID-19 infection (fever, chills, cough, or new shortness of breath).   Past Medical, Surgical, Social History, Allergies, and Medications have been Reviewed.  Past Medical History:  Diagnosis Date  . Breakthrough bleeding associated with intrauterine device (IUD) 03/22/2017  . Hypertension   . IUD contraception  10/05/2016  . Malpositioned IUD 04/14/2017   REmoved 04/14/17   . MIGRAINES, HX OF 06/11/2008   Qualifier: Diagnosis of  By: Truett Mainland MD, Christine    . Pedal edema 10/05/2016   Past Surgical History:  Procedure Laterality Date  . CERVICAL CERCLAGE    . CESAREAN SECTION     2000,2003,2005  . CESAREAN SECTION  08/17/1998     Current Meds  Medication Sig  . amLODipine (NORVASC) 10 MG tablet Take 1 tablet (10 mg total) by mouth daily.  . Ferrous Sulfate (IRON PO) Take by mouth. Once a day  . fluticasone (FLONASE) 50 MCG/ACT nasal spray SHAKE LIQUID AND USE 2 SPRAYS IN EACH NOSTRIL DAILY  . levocetirizine (XYZAL) 5 MG tablet TAKE 1 TABLET(5 MG) BY MOUTH EVERY EVENING  . norelgestromin-ethinyl estradiol (ORTHO EVRA) 150-35 MCG/24HR transdermal patch Place 1 patch onto the skin once a week.     Allergies:   Patient has no known allergies.   ROS:   Please see the history of present illness.     All other systems reviewed and are negative.   Labs/Other Tests and Data Reviewed:    Recent Labs: 11/14/2019: ALT 6; BUN 10; Creat 0.61; Hemoglobin 9.1; Platelets 175; Potassium 3.5; Sodium 136   Recent Lipid Panel Lab Results  Component Value Date/Time   CHOL 188 11/14/2019 07:33 AM   TRIG 101 11/14/2019 07:33 AM   HDL 53 11/14/2019 07:33 AM   CHOLHDL 3.5 11/14/2019 07:33 AM   LDLCALC 115 (H) 11/14/2019 07:33 AM    Wt Readings from Last 3 Encounters:  08/28/20 174 lb 12.8 oz (79.3 kg)  07/23/20  174 lb 9.6 oz (79.2 kg)  04/26/20 176 lb (79.8 kg)      ASSESSMENT & PLAN:    Iron deficiency anemia Hb: ~9 Last checked in 10/2019 Last iron profile reviewed Recheck CBC and iron profile On iron supplements  Uterine fibroids Needs to f/u with Ob/Gyn for evaluation and discussion of surgical removal She used to follow up with [facility].  Time:   Today, I have spent 9 minutes reviewing the chart, including problem list, medications, and with the patient with telehealth  technology discussing the above problems.   Medication Adjustments/Labs and Tests Ordered: Current medicines are reviewed at length with the patient today.  Concerns regarding medicines are outlined above.   Tests Ordered: Orders Placed This Encounter  Procedures  . CBC with Differential/Platelet  . Fe+TIBC+Fer  . HIV antibody (with reflex)  . Hepatitis C Antibody    Medication Changes: No orders of the defined types were placed in this encounter.    Note: This dictation was prepared with Dragon dictation along with smaller phrase technology. Similar sounding words can be transcribed inadequately or may not be corrected upon review. Any transcriptional errors that result from this process are unintentional.      Disposition:  Follow up  Signed, Lindell Spar, MD  09/12/2020 9:06 AM     [facility]

## 2020-09-14 LAB — CBC WITH DIFFERENTIAL/PLATELET
Basophils Absolute: 0.1 10*3/uL (ref 0.0–0.2)
Basos: 1 %
EOS (ABSOLUTE): 0.1 10*3/uL (ref 0.0–0.4)
Eos: 1 %
Hematocrit: 35.4 % (ref 34.0–46.6)
Hemoglobin: 11 g/dL — ABNORMAL LOW (ref 11.1–15.9)
Immature Grans (Abs): 0 10*3/uL (ref 0.0–0.1)
Immature Granulocytes: 0 %
Lymphocytes Absolute: 2 10*3/uL (ref 0.7–3.1)
Lymphs: 26 %
MCH: 24 pg — ABNORMAL LOW (ref 26.6–33.0)
MCHC: 31.1 g/dL — ABNORMAL LOW (ref 31.5–35.7)
MCV: 77 fL — ABNORMAL LOW (ref 79–97)
Monocytes Absolute: 0.6 10*3/uL (ref 0.1–0.9)
Monocytes: 8 %
Neutrophils Absolute: 5.2 10*3/uL (ref 1.4–7.0)
Neutrophils: 64 %
Platelets: 388 10*3/uL (ref 150–450)
RBC: 4.59 x10E6/uL (ref 3.77–5.28)
RDW: 17.2 % — ABNORMAL HIGH (ref 11.7–15.4)
WBC: 8 10*3/uL (ref 3.4–10.8)

## 2020-09-14 LAB — IRON,TIBC AND FERRITIN PANEL
Ferritin: 11 ng/mL — ABNORMAL LOW (ref 15–150)
Iron Saturation: 81 % (ref 15–55)
Iron: 394 ug/dL (ref 27–159)
Total Iron Binding Capacity: 489 ug/dL — ABNORMAL HIGH (ref 250–450)
UIBC: 95 ug/dL — ABNORMAL LOW (ref 131–425)

## 2020-09-14 LAB — HEPATITIS C ANTIBODY: Hep C Virus Ab: <0.1 s/co ratio (ref 0.0–0.9)

## 2020-09-14 LAB — HIV ANTIBODY (ROUTINE TESTING W REFLEX): HIV Screen 4th Generation wRfx: NONREACTIVE

## 2020-09-20 ENCOUNTER — Other Ambulatory Visit: Payer: Self-pay

## 2020-09-20 ENCOUNTER — Other Ambulatory Visit (HOSPITAL_COMMUNITY)
Admission: RE | Admit: 2020-09-20 | Discharge: 2020-09-20 | Disposition: A | Discharge status: Home / Self Care | Payer: BC Managed Care – PPO | Source: Ambulatory Visit | Attending: Internal Medicine | Admitting: Internal Medicine

## 2020-09-20 DIAGNOSIS — Z3202 Encounter for pregnancy test, result negative: Secondary | ICD-10-CM | POA: Diagnosis present

## 2020-09-20 LAB — PREGNANCY, URINE: Preg Test, Ur: NEGATIVE

## 2020-09-22 ENCOUNTER — Encounter (HOSPITAL_COMMUNITY): Payer: Self-pay | Admitting: Anesthesiology

## 2020-09-23 ENCOUNTER — Telehealth: Payer: Self-pay

## 2020-09-23 ENCOUNTER — Ambulatory Visit (HOSPITAL_COMMUNITY): Admission: RE | Admit: 2020-09-23 | Payer: BC Managed Care – PPO | Source: Home / Self Care

## 2020-09-23 SURGERY — COLONOSCOPY WITH PROPOFOL
Anesthesia: Monitor Anesthesia Care

## 2020-09-23 NOTE — Telephone Encounter (Signed)
noted 

## 2020-09-23 NOTE — Telephone Encounter (Signed)
Pt called office and LMOVM, she cancelled TCS scheduled for today because she is having headache, cough, and stuffy nose. She will call back to reschedule.  Procedure has already been cancelled.  Tried to call pt back, LMOVM to inform her voicemail was received and she can call later to reschedule.

## 2020-10-07 ENCOUNTER — Other Ambulatory Visit (HOSPITAL_COMMUNITY): Payer: Self-pay | Admitting: Family Medicine

## 2020-10-07 DIAGNOSIS — Z1231 Encounter for screening mammogram for malignant neoplasm of breast: Secondary | ICD-10-CM

## 2020-10-17 ENCOUNTER — Other Ambulatory Visit: Payer: Self-pay

## 2020-10-17 MED ORDER — NORELGESTROMIN-ETH ESTRADIOL 150-35 MCG/24HR TD PTWK: 1.0000 | MEDICATED_PATCH | TRANSDERMAL | 11 refills | Status: DC

## 2020-10-18 ENCOUNTER — Other Ambulatory Visit: Payer: Self-pay

## 2020-10-18 ENCOUNTER — Ambulatory Visit (HOSPITAL_COMMUNITY)
Admission: RE | Admit: 2020-10-18 | Discharge: 2020-10-18 | Disposition: A | Discharge status: Home / Self Care | Payer: BC Managed Care – PPO | Source: Ambulatory Visit | Attending: Family Medicine | Admitting: Family Medicine

## 2020-10-18 DIAGNOSIS — Z1231 Encounter for screening mammogram for malignant neoplasm of breast: Secondary | ICD-10-CM | POA: Insufficient documentation

## 2020-10-25 ENCOUNTER — Ambulatory Visit: Payer: BC Managed Care – PPO | Admitting: Family Medicine

## 2020-11-29 ENCOUNTER — Ambulatory Visit: Payer: Self-pay | Admitting: Family Medicine

## 2020-12-18 ENCOUNTER — Telehealth: Payer: Self-pay | Admitting: Internal Medicine

## 2020-12-18 NOTE — Telephone Encounter (Signed)
Patient called wanting to get her colonoscopy back on the schedule.  Had to cancel in June due to illness. Does she need to have another office visit or can she be triaged?

## 2020-12-18 NOTE — Telephone Encounter (Signed)
No office visit needed, okay to schedule.  Thank you

## 2020-12-19 NOTE — Telephone Encounter (Signed)
Pt needs nurse visit.  Can be done by phone if she wants to.  Tried to call pt but had to [facility].

## 2021-01-29 ENCOUNTER — Ambulatory Visit: Payer: BC Managed Care – PPO

## 2021-01-29 ENCOUNTER — Encounter: Payer: Self-pay | Admitting: Internal Medicine

## 2021-02-03 ENCOUNTER — Other Ambulatory Visit: Payer: Self-pay | Admitting: *Deleted

## 2021-02-03 DIAGNOSIS — I10 Essential (primary) hypertension: Secondary | ICD-10-CM

## 2021-02-03 MED ORDER — AMLODIPINE BESYLATE 10 MG PO TABS: 10.0000 mg | ORAL_TABLET | Freq: Every day | ORAL | 1 refills | Status: DC

## 2021-04-07 ENCOUNTER — Ambulatory Visit: Payer: BC Managed Care – PPO | Admitting: Gastroenterology

## 2021-07-05 ENCOUNTER — Emergency Department (HOSPITAL_COMMUNITY): Payer: BC Managed Care – PPO

## 2021-07-05 ENCOUNTER — Observation Stay (HOSPITAL_COMMUNITY)
Admission: EM | Admit: 2021-07-05 | Discharge: 2021-07-08 | Disposition: A | Discharge status: Home / Self Care | Payer: BC Managed Care – PPO | Attending: Family Medicine | Admitting: Family Medicine

## 2021-07-05 ENCOUNTER — Encounter (HOSPITAL_COMMUNITY): Payer: Self-pay | Admitting: Emergency Medicine

## 2021-07-05 DIAGNOSIS — D649 Anemia, unspecified: Secondary | ICD-10-CM | POA: Diagnosis not present

## 2021-07-05 DIAGNOSIS — I10 Essential (primary) hypertension: Secondary | ICD-10-CM | POA: Diagnosis not present

## 2021-07-05 DIAGNOSIS — Z8669 Personal history of other diseases of the nervous system and sense organs: Secondary | ICD-10-CM

## 2021-07-05 DIAGNOSIS — Z79899 Other long term (current) drug therapy: Secondary | ICD-10-CM | POA: Insufficient documentation

## 2021-07-05 DIAGNOSIS — R200 Anesthesia of skin: Secondary | ICD-10-CM | POA: Diagnosis present

## 2021-07-05 DIAGNOSIS — G459 Transient cerebral ischemic attack, unspecified: Secondary | ICD-10-CM | POA: Diagnosis not present

## 2021-07-05 DIAGNOSIS — Z20822 Contact with and (suspected) exposure to covid-19: Secondary | ICD-10-CM | POA: Diagnosis not present

## 2021-07-05 DIAGNOSIS — J309 Allergic rhinitis, unspecified: Secondary | ICD-10-CM | POA: Diagnosis present

## 2021-07-05 DIAGNOSIS — D509 Iron deficiency anemia, unspecified: Secondary | ICD-10-CM

## 2021-07-05 DIAGNOSIS — I639 Cerebral infarction, unspecified: Secondary | ICD-10-CM

## 2021-07-05 HISTORY — DX: Bell's palsy: G51.0

## 2021-07-05 LAB — I-STAT BETA HCG BLOOD, ED (MC, WL, AP ONLY): I-stat hCG, quantitative: <5 m[IU]/mL (ref <5)

## 2021-07-05 LAB — I-STAT CHEM 8, ED
BUN: 10 mg/dL (ref 6–20)
Calcium, Ion: 1.05 mmol/L — ABNORMAL LOW (ref 1.15–1.40)
Chloride: 103 mmol/L (ref 98–111)
Creatinine, Ser: 0.9 mg/dL (ref 0.44–1.00)
Glucose, Bld: 96 mg/dL (ref 70–99)
HCT: 27 % — ABNORMAL LOW (ref 36.0–46.0)
Hemoglobin: 9.2 g/dL — ABNORMAL LOW (ref 12.0–15.0)
Potassium: 3.4 mmol/L — ABNORMAL LOW (ref 3.5–5.1)
Sodium: 138 mmol/L (ref 135–145)
TCO2: 26 mmol/L (ref 22–32)

## 2021-07-05 LAB — CBC
HCT: 27.9 % — ABNORMAL LOW (ref 36.0–46.0)
Hemoglobin: 8 g/dL — ABNORMAL LOW (ref 12.0–15.0)
MCH: 20.3 pg — ABNORMAL LOW (ref 26.0–34.0)
MCHC: 28.7 g/dL — ABNORMAL LOW (ref 30.0–36.0)
MCV: 70.6 fL — ABNORMAL LOW (ref 80.0–100.0)
Platelets: 329 10*3/uL (ref 150–400)
RBC: 3.95 MIL/uL (ref 3.87–5.11)
RDW: 20.1 % — ABNORMAL HIGH (ref 11.5–15.5)
WBC: 9.4 10*3/uL (ref 4.0–10.5)
nRBC: 0 % (ref 0.0–0.2)

## 2021-07-05 LAB — DIFFERENTIAL
Abs Immature Granulocytes: 0.06 10*3/uL (ref 0.00–0.07)
Basophils Absolute: 0.1 10*3/uL (ref 0.0–0.1)
Basophils Relative: 1 %
Eosinophils Absolute: 0.2 10*3/uL (ref 0.0–0.5)
Eosinophils Relative: 3 %
Immature Granulocytes: 1 %
Lymphocytes Relative: 26 %
Lymphs Abs: 2.4 10*3/uL (ref 0.7–4.0)
Monocytes Absolute: 0.7 10*3/uL (ref 0.1–1.0)
Monocytes Relative: 8 %
Neutro Abs: 5.9 10*3/uL (ref 1.7–7.7)
Neutrophils Relative %: 61 %

## 2021-07-05 LAB — PROTIME-INR
INR: 1 (ref 0.8–1.2)
Prothrombin Time: 13.6 seconds (ref 11.4–15.2)

## 2021-07-05 LAB — CBG MONITORING, ED: Glucose-Capillary: 101 mg/dL — ABNORMAL HIGH (ref 70–99)

## 2021-07-05 NOTE — ED Provider Notes (Signed)
?[place] ?Provider Note ? ? ?CSN: [id number] ?Arrival date & time: 07/05/21  2303 ? ?  ? ?History ? ?No chief complaint on file. ? ? ?Margaret Owen is a 46 y.o. female. ? ?The history is provided by the patient.  ?Weakness ?Severity:  Moderate ?Onset quality:  Sudden ?Duration: 1.5 hours. ?Timing:  Constant ?Progression:  Improving ?Chronicity:  New ?Context: not decreased sleep   ?Relieved by:  Nothing ?Worsened by:  Nothing ?Ineffective treatments:  None tried ?Associated symptoms: aphasia, headaches and sensory-motor deficit   ?Associated symptoms: no fever, no loss of consciousness and no near-syncope   ?Risk factors: no diabetes   ? ?  ? ?Home Medications ?Prior to Admission medications   ?Medication Sig Start Date End Date Taking? Authorizing Provider  ?amLODipine (NORVASC) 10 MG tablet Take 1 tablet (10 mg total) by mouth daily. 02/03/21   Lindell Spar, MD  ?ferrous sulfate 325 (65 FE) MG tablet Take 650 mg by mouth daily with breakfast.    [provider]  ?fluticasone (FLONASE) 50 MCG/ACT nasal spray SHAKE LIQUID AND USE 2 SPRAYS IN EACH NOSTRIL DAILY ?Patient taking differently: Place 2 sprays into both nostrils daily. 08/23/20   Lindell Spar, MD  ?levocetirizine (XYZAL) 5 MG tablet TAKE 1 TABLET(5 MG) BY MOUTH EVERY EVENING ?Patient taking differently: Take 5 mg by mouth every evening. 08/23/20   Lindell Spar, MD  ?norelgestromin-ethinyl estradiol (ORTHO EVRA) 150-35 MCG/24HR transdermal patch Place 1 patch onto the skin once a week. 10/17/20   Christin Fudge, CNM  ?oxymetazoline (AFRIN) 0.05 % nasal spray Place 1 spray into both nostrils daily as needed for congestion.    [provider]  ?   ? ?Allergies    ?Patient has no known allergies.   ? ?Review of Systems   ?Review of Systems  ?Constitutional:  Negative for fever.  ?HENT:  Negative for facial swelling.   ?Respiratory:  Negative for wheezing and stridor.    ?Cardiovascular:  Negative for leg swelling and near-syncope.  ?Neurological:  Positive for facial asymmetry, speech difficulty, weakness and headaches. Negative for loss of consciousness.  ?All other systems reviewed and are negative. ? ?Physical Exam ?Updated Vital Signs ?BP 139/87 (BP Location: Right Arm)   Pulse 100   Temp 98.2 ?F (36.8 ?C) (Oral)   Resp 16   SpO2 100%  ?Physical Exam ?Vitals and nursing note reviewed. Exam conducted with a chaperone present.  ?Constitutional:   ?   Appearance: Normal appearance. She is not diaphoretic.  ?HENT:  ?   Head: Normocephalic and atraumatic.  ?   Nose: Nose normal.  ?   Mouth/Throat:  ?   Mouth: Mucous membranes are moist.  ?   Pharynx: Oropharynx is clear.  ?Eyes:  ?   Extraocular Movements: Extraocular movements intact.  ?   Conjunctiva/sclera: Conjunctivae normal.  ?   Pupils: Pupils are equal, round, and reactive to light.  ?Cardiovascular:  ?   Rate and Rhythm: Normal rate and regular rhythm.  ?   Pulses: Normal pulses.  ?   Heart sounds: Normal heart sounds.  ?Pulmonary:  ?   Effort: Pulmonary effort is normal.  ?   Breath sounds: Normal breath sounds.  ?Abdominal:  ?   General: Bowel sounds are normal.  ?   Palpations: Abdomen is soft.  ?   Tenderness: There is no abdominal tenderness. There is no guarding.  ?Musculoskeletal:     ?   General:  Normal range of motion.  ?   Cervical back: Normal range of motion and neck supple.  ?Skin: ?   General: Skin is warm and dry.  ?   Capillary Refill: Capillary refill takes less than 2 seconds.  ?Neurological:  ?   Mental Status: She is alert and oriented to person, place, and time.  ?   Deep Tendon Reflexes: Reflexes normal.  ?Psychiatric:     ?   Mood and Affect: Mood normal.     ?   Behavior: Behavior normal.  ? ? ?ED Results / Procedures / Treatments   ?Labs ?(all labs ordered are listed, but only abnormal results are displayed) ?Results for orders placed or performed during the hospital encounter of 07/05/21  ?CBC   ?Result Value Ref Range  ? WBC 9.4 4.0 - 10.5 K/uL  ? RBC 3.95 3.87 - 5.11 MIL/uL  ? Hemoglobin 8.0 (L) 12.0 - 15.0 g/dL  ? HCT 27.9 (L) 36.0 - 46.0 %  ? MCV 70.6 (L) 80.0 - 100.0 fL  ? MCH 20.3 (L) 26.0 - 34.0 pg  ? MCHC 28.7 (L) 30.0 - 36.0 g/dL  ? RDW 20.1 (H) 11.5 - 15.5 %  ? Platelets 329 150 - 400 K/uL  ? nRBC 0.0 0.0 - 0.2 %  ?Differential  ?Result Value Ref Range  ? Neutrophils Relative % 61 %  ? Neutro Abs 5.9 1.7 - 7.7 K/uL  ? Lymphocytes Relative 26 %  ? Lymphs Abs 2.4 0.7 - 4.0 K/uL  ? Monocytes Relative 8 %  ? Monocytes Absolute 0.7 0.1 - 1.0 K/uL  ? Eosinophils Relative 3 %  ? Eosinophils Absolute 0.2 0.0 - 0.5 K/uL  ? Basophils Relative 1 %  ? Basophils Absolute 0.1 0.0 - 0.1 K/uL  ? Immature Granulocytes 1 %  ? Abs Immature Granulocytes 0.06 0.00 - 0.07 K/uL  ?I-stat chem 8, ED  ?Result Value Ref Range  ? Sodium 138 135 - 145 mmol/L  ? Potassium 3.4 (L) 3.5 - 5.1 mmol/L  ? Chloride 103 98 - 111 mmol/L  ? BUN 10 6 - 20 mg/dL  ? Creatinine, Ser 0.90 0.44 - 1.00 mg/dL  ? Glucose, Bld 96 70 - 99 mg/dL  ? Calcium, Ion 1.05 (L) 1.15 - 1.40 mmol/L  ? TCO2 26 22 - 32 mmol/L  ? Hemoglobin 9.2 (L) 12.0 - 15.0 g/dL  ? HCT 27.0 (L) 36.0 - 46.0 %  ? ?No results found. ? ?EKG ? EKG Interpretation ? ?Date/Time:  Saturday July 05 2021 23:18:48 EDT ?Ventricular Rate:  77 ?PR Interval:  164 ?QRS Duration: 86 ?QT Interval:  392 ?QTC Calculation: 443 ?R Axis:   55 ?Text Interpretation: Normal sinus rhythm Confirmed by Randal Buba, Myan Locatelli ([id number]) on 07/05/2021 11:40:10 PM ?  ? ?  ? ? ? ? ? ?Radiology ?No results found. ? ?Procedures ?Procedures  ? ? ?Medications Ordered in ED ?Medications - No data to display ? ?ED Course/ Medical Decision Making/ A&P ?  ?                        ?Medical Decision Making ?Patient with global weakness and HA at grocery store at 6 pm ate jerky then at 1015, facial droop, weakness, facial twitching and speech difficulty and leg and arm weakness  ? ?Amount and/or Complexity of Data  Reviewed ?External Data Reviewed: notes. ?   Details: outpatient notes ?Labs: ordered. ?   Details: all labs reviewed by me: normal  white count, hemoglobin low at 8.o and normal platelets, normal sodium on chemistry with normal BUN and creatinine ?Radiology: ordered. ?   Details: ct head reviewed by me negative for CVA ?ECG/medicine tests: ordered and independent interpretation performed. Decision-making details documented in ED Course. ?Discussion of management or test interpretation with external provider(s): Case d/w Dr. Lorrin Goodell who has seen the patient in consult  ? ?Risk ?Decision regarding hospitalization. ? ? ?The patient appears reasonably stabilized for admission considering the current resources, flow, and capabilities available in the ED at this time, and I doubt any other [facility] requiring further screening and/or treatment in the ED prior to admission.  ?Final Clinical Impression(s) / ED Diagnoses ?Final diagnoses:  ?None  ? ?  Veatrice Kells, MD ?07/06/21 0113 ? ?

## 2021-07-05 NOTE — ED Triage Notes (Signed)
BIB [facility] from home. Headache starting at 1800. Numbness left side, slurred speech starting at 2200. Resolved within 10 minutes. Still reports some left weakness. ?

## 2021-07-06 ENCOUNTER — Observation Stay (HOSPITAL_COMMUNITY): Payer: BC Managed Care – PPO

## 2021-07-06 ENCOUNTER — Encounter (HOSPITAL_COMMUNITY): Payer: BC Managed Care – PPO

## 2021-07-06 ENCOUNTER — Observation Stay (HOSPITAL_BASED_OUTPATIENT_CLINIC_OR_DEPARTMENT_OTHER): Payer: BC Managed Care – PPO

## 2021-07-06 ENCOUNTER — Encounter (HOSPITAL_COMMUNITY): Payer: Self-pay | Admitting: Emergency Medicine

## 2021-07-06 ENCOUNTER — Other Ambulatory Visit: Payer: Self-pay

## 2021-07-06 DIAGNOSIS — G459 Transient cerebral ischemic attack, unspecified: Secondary | ICD-10-CM

## 2021-07-06 DIAGNOSIS — J3089 Other allergic rhinitis: Secondary | ICD-10-CM

## 2021-07-06 DIAGNOSIS — D649 Anemia, unspecified: Secondary | ICD-10-CM

## 2021-07-06 DIAGNOSIS — I10 Essential (primary) hypertension: Secondary | ICD-10-CM

## 2021-07-06 DIAGNOSIS — I63411 Cerebral infarction due to embolism of right middle cerebral artery: Secondary | ICD-10-CM

## 2021-07-06 DIAGNOSIS — D509 Iron deficiency anemia, unspecified: Secondary | ICD-10-CM

## 2021-07-06 DIAGNOSIS — Z8669 Personal history of other diseases of the nervous system and sense organs: Secondary | ICD-10-CM

## 2021-07-06 LAB — URINALYSIS, ROUTINE W REFLEX MICROSCOPIC
Bacteria, UA: NONE SEEN
Bilirubin Urine: NEGATIVE
Glucose, UA: NEGATIVE mg/dL
Ketones, ur: NEGATIVE mg/dL
Leukocytes,Ua: NEGATIVE
Nitrite: NEGATIVE
Protein, ur: NEGATIVE mg/dL
Specific Gravity, Urine: 1.015 (ref 1.005–1.030)
pH: 8 (ref 5.0–8.0)

## 2021-07-06 LAB — SEDIMENTATION RATE: Sed Rate: 20 mm/hr (ref 0–22)

## 2021-07-06 LAB — TSH: TSH: 1.356 u[IU]/mL (ref 0.350–4.500)

## 2021-07-06 LAB — COMPREHENSIVE METABOLIC PANEL
ALT: 9 U/L (ref 0–44)
AST: 23 U/L (ref 15–41)
Albumin: 3 g/dL — ABNORMAL LOW (ref 3.5–5.0)
Alkaline Phosphatase: 45 U/L (ref 38–126)
Anion gap: 11 (ref 5–15)
BUN: 8 mg/dL (ref 6–20)
CO2: 21 mmol/L — ABNORMAL LOW (ref 22–32)
Calcium: 8.4 mg/dL — ABNORMAL LOW (ref 8.9–10.3)
Chloride: 104 mmol/L (ref 98–111)
Creatinine, Ser: 0.87 mg/dL (ref 0.44–1.00)
GFR, Estimated: >60 mL/min (ref >60)
Glucose, Bld: 97 mg/dL (ref 70–99)
Potassium: 3.4 mmol/L — ABNORMAL LOW (ref 3.5–5.1)
Sodium: 136 mmol/L (ref 135–145)
Total Bilirubin: 0.4 mg/dL (ref 0.3–1.2)
Total Protein: 6.1 g/dL — ABNORMAL LOW (ref 6.5–8.1)

## 2021-07-06 LAB — IRON AND TIBC
Iron: 15 ug/dL — ABNORMAL LOW (ref 28–170)
Saturation Ratios: 3 % — ABNORMAL LOW (ref 10.4–31.8)
TIBC: 529 ug/dL — ABNORMAL HIGH (ref 250–450)
UIBC: 514 ug/dL

## 2021-07-06 LAB — ECHOCARDIOGRAM COMPLETE
AR max vel: 2.18 cm2
AV Area VTI: 2.21 cm2
AV Area mean vel: 2.18 cm2
AV Mean grad: 4 mmHg
AV Peak grad: 8.5 mmHg
Ao pk vel: 1.46 m/s
Area-P 1/2: 3.12 cm2
S' Lateral: 2.8 cm

## 2021-07-06 LAB — APTT: aPTT: <20 seconds — ABNORMAL LOW (ref 24–36)

## 2021-07-06 LAB — RAPID URINE DRUG SCREEN, HOSP PERFORMED
Amphetamines: NOT DETECTED
Barbiturates: NOT DETECTED
Benzodiazepines: NOT DETECTED
Cocaine: NOT DETECTED
Opiates: NOT DETECTED
Tetrahydrocannabinol: NOT DETECTED

## 2021-07-06 LAB — LIPID PANEL
Cholesterol: 174 mg/dL (ref 0–200)
HDL: 42 mg/dL (ref >40)
LDL Cholesterol: 116 mg/dL — ABNORMAL HIGH (ref 0–99)
Total CHOL/HDL Ratio: 4.1 RATIO
Triglycerides: 79 mg/dL (ref <150)
VLDL: 16 mg/dL (ref 0–40)

## 2021-07-06 LAB — RAPID HIV SCREEN (HIV 1/2 AB+AG)
HIV 1/2 Antibodies: NONREACTIVE
HIV-1 P24 Antigen - HIV24: NONREACTIVE

## 2021-07-06 LAB — C-REACTIVE PROTEIN: CRP: 0.6 mg/dL (ref <1.0)

## 2021-07-06 LAB — HEMOGLOBIN A1C
Hgb A1c MFr Bld: 5.6 % (ref 4.8–5.6)
Mean Plasma Glucose: 114.02 mg/dL

## 2021-07-06 LAB — ETHANOL: Alcohol, Ethyl (B): <10 mg/dL (ref <10)

## 2021-07-06 LAB — RESP PANEL BY RT-PCR (FLU A&B, COVID) ARPGX2
Influenza A by PCR: NEGATIVE
Influenza B by PCR: NEGATIVE
SARS Coronavirus 2 by RT PCR: NEGATIVE

## 2021-07-06 LAB — ANTITHROMBIN III: AntiThromb III Func: 105 % (ref 75–120)

## 2021-07-06 LAB — VITAMIN B12: Vitamin B-12: 113 pg/mL — ABNORMAL LOW (ref 180–914)

## 2021-07-06 MED ORDER — ACETAMINOPHEN 650 MG RE SUPP: 650.0000 mg | RECTAL | Status: DC | PRN

## 2021-07-06 MED ORDER — ASPIRIN EC 81 MG PO TBEC
81.0000 mg | DELAYED_RELEASE_TABLET | Freq: Every day | ORAL | Status: DC
  Administered 2021-07-06 – 2021-07-08 (×3): 81 mg via ORAL
  Filled 2021-07-06 (×4): qty 1

## 2021-07-06 MED ORDER — STROKE: EARLY STAGES OF RECOVERY BOOK
Freq: Once | Status: AC
  Filled 2021-07-06: qty 1

## 2021-07-06 MED ORDER — STROKE: EARLY STAGES OF RECOVERY BOOK
Status: AC
  Filled 2021-07-06: qty 1

## 2021-07-06 MED ORDER — IOHEXOL 350 MG/ML SOLN
75.0000 mL | Freq: Once | INTRAVENOUS | Status: AC | PRN
  Administered 2021-07-06: 75 mL via INTRAVENOUS

## 2021-07-06 MED ORDER — LORAZEPAM 2 MG/ML IJ SOLN
1.0000 mg | INTRAMUSCULAR | Status: AC | PRN
  Administered 2021-07-06: 1 mg via INTRAVENOUS
  Filled 2021-07-06: qty 1

## 2021-07-06 MED ORDER — ACETAMINOPHEN 325 MG PO TABS: 650.0000 mg | ORAL_TABLET | ORAL | Status: DC | PRN

## 2021-07-06 MED ORDER — MELATONIN 5 MG PO TABS: 10.0000 mg | ORAL_TABLET | Freq: Every evening | ORAL | Status: DC | PRN

## 2021-07-06 MED ORDER — ENOXAPARIN SODIUM 40 MG/0.4ML IJ SOSY
40.0000 mg | PREFILLED_SYRINGE | INTRAMUSCULAR | Status: DC
  Administered 2021-07-06 – 2021-07-08 (×3): 40 mg via SUBCUTANEOUS
  Filled 2021-07-06 (×4): qty 0.4

## 2021-07-06 MED ORDER — ROSUVASTATIN CALCIUM 20 MG PO TABS
20.0000 mg | ORAL_TABLET | Freq: Every day | ORAL | Status: DC
Start: 2021-07-06 — End: 2021-07-08
  Administered 2021-07-06 – 2021-07-08 (×3): 20 mg via ORAL
  Filled 2021-07-06 (×4): qty 1

## 2021-07-06 MED ORDER — CYANOCOBALAMIN 1000 MCG/ML IJ SOLN
1000.0000 ug | Freq: Once | INTRAMUSCULAR | Status: AC
  Administered 2021-07-06: 1000 ug via INTRAMUSCULAR
  Filled 2021-07-06: qty 1

## 2021-07-06 MED ORDER — POTASSIUM CHLORIDE CRYS ER 20 MEQ PO TBCR
40.0000 meq | EXTENDED_RELEASE_TABLET | Freq: Once | ORAL | Status: AC
  Administered 2021-07-06: 40 meq via ORAL
  Filled 2021-07-06: qty 2

## 2021-07-06 MED ORDER — LORATADINE 10 MG PO TABS
10.0000 mg | ORAL_TABLET | Freq: Every evening | ORAL | Status: DC
  Administered 2021-07-07: 10 mg via ORAL
  Filled 2021-07-06: qty 1

## 2021-07-06 MED ORDER — VITAMIN B-12 1000 MCG PO TABS
1000.0000 ug | ORAL_TABLET | Freq: Every day | ORAL | Status: DC
  Administered 2021-07-07 – 2021-07-08 (×2): 1000 ug via ORAL
  Filled 2021-07-06 (×3): qty 1

## 2021-07-06 MED ORDER — ONDANSETRON HCL 4 MG/2ML IJ SOLN: 4.0000 mg | Freq: Four times a day (QID) | INTRAMUSCULAR | Status: DC | PRN

## 2021-07-06 MED ORDER — ACETAMINOPHEN 160 MG/5ML PO SOLN: 650.0000 mg | ORAL | Status: DC | PRN

## 2021-07-06 MED ORDER — SODIUM CHLORIDE 0.9 % IV SOLN
INTRAVENOUS | Status: DC
Start: 2021-07-07 — End: 2021-07-07

## 2021-07-06 NOTE — Evaluation (Signed)
Occupational Therapy Evaluation ?Patient Details ?Name: Margaret Owen ?MRN: [id number] ?DOB: [birth year] ?Today's Date: 07/06/2021 ? ? ?History of Present Illness Margaret Owen is a 46 y.o. 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 ?Clinical Impression ?  ?Pt admitted for concerns listed above. PTA pt reported that she was independent with all ADL's and IADL's, including working. At this time, pt presents back at her baseline. Her vision and cognition are [facility], and she demonstrates independence with all BADL's and functional mobility. Pt has no further OT needs and acute OT will sign off.   ?   ? ?Recommendations for follow up therapy are one component of a multi-disciplinary discharge planning process, led by the attending physician.  Recommendations may be updated based on patient status, additional functional criteria and insurance authorization.  ? ?Follow Up Recommendations ? No OT follow up  ?  ?Assistance Recommended at Discharge None  ?Patient can return home with the following   ? ?  ?Functional Status Assessment ? Patient has had a recent decline in their functional status and demonstrates the ability to make significant improvements in function in a reasonable and predictable amount of time.  ?Equipment Recommendations ? None recommended by OT  ?  ?Recommendations for Other Services   ? ? ?  ?Precautions / Restrictions Precautions ?Precautions: Fall ?Restrictions ?Weight Bearing Restrictions: No  ? ?  ? ?Mobility Bed Mobility ?Overal bed mobility: Independent ?  ?  ?  ?  ?  ?  ?  ?  ? ?Transfers ?Overall transfer level: Independent ?Equipment used: None ?  ?  ?  ?  ?  ?  ?  ?  ?  ? ?  ?Balance Overall balance assessment: Independent ?  ?   ?  ?  ?  ?  ?  ?  ?  ?  ?  ?  ?  ?  ?  ?  ?  ?  ?   ? ?ADL either performed or assessed with clinical judgement  ? ?ADL Overall ADL's : Independent;At baseline ?  ?  ?  ?  ?  ?  ?  ?  ?  ?  ?  ?  ?  ?  ?  ?  ?  ?  ?  ?   ? ? ? ?Vision Baseline Vision/History: 0 No visual deficits ?Ability to See in Adequate Light: 0 Adequate ?Patient Visual Report: No change from baseline ?Vision Assessment?: No apparent visual deficits  ?   ?Perception   ?  ?Praxis   ?  ? ?Pertinent Vitals/Pain Pain Assessment ?Pain Assessment: No/denies pain  ? ? ? ?Hand Dominance Right ?  ?Extremity/Trunk Assessment Upper Extremity Assessment ?Upper Extremity Assessment: Overall WFL for tasks assessed ?  ?Lower Extremity Assessment ?Lower Extremity Assessment: Overall WFL for tasks assessed ?  ?Cervical / Trunk Assessment ?Cervical / Trunk Assessment: Normal ?  ?Communication Communication ?Communication: No difficulties ?  ?Cognition Arousal/Alertness: Awake/alert ?Behavior During Therapy: [facility] for tasks assessed/performed ?Overall Cognitive Status: Within Functional Limits for tasks assessed ?  ?  ?  ?  ?  ?  ?  ?  ?  ?  ?  ?  ?  ?  ?  ?  ?  ?  ?  ?  General Comments  VSS on RA, husband present and supportive ? ?  ?Exercises   ?  ?Shoulder Instructions    ? ? ?Home Living Family/patient expects to be discharged to:: Private residence ?Living Arrangements: Spouse/significant other;Children ?Available Help at Discharge: Family;Available 24 hours/day ?Type of Home: House ?Home Access: Level entry ?  ?  ?Home Layout: Two level ?Alternate Level Stairs-Number of Steps: full flight ?Alternate Level Stairs-Rails: Right ?Bathroom Shower/Tub: Tub/shower unit ?  ?Bathroom Toilet: Handicapped height ?Bathroom Accessibility: No ?  ?Home Equipment: None ?  ?  ?  ? ?  ?Prior Functioning/Environment Prior Level of Function : Independent/Modified Independent;Working/employed;Driving ?  ?  ?  ?  ?  ?  ?  ?  ?  ? ?  ?  ?OT Problem List: Decreased activity  tolerance;Impaired balance (sitting and/or standing) ?  ?   ?OT Treatment/Interventions:    ?  ?OT Goals(Current goals can be found in the care plan section) Acute Rehab OT Goals ?Patient Stated Goal: To go home ?OT Goal Formulation: With patient ?Time For Goal Achievement: 07/06/21 ?Potential to Achieve Goals: Good  ?OT Frequency:   ?  ? ?Co-evaluation   ?  ?  ?  ?  ? ?  ?AM-PAC OT "6 Clicks" Daily Activity     ?Outcome Measure Help from another person eating meals?: None ?Help from another person taking care of personal grooming?: None ?Help from another person toileting, which includes using toliet, bedpan, or urinal?: None ?Help from another person bathing (including washing, rinsing, drying)?: None ?Help from another person to put on and taking off regular upper body clothing?: None ?Help from another person to put on and taking off regular lower body clothing?: None ?6 Click Score: 24 ?  ?End of Session Nurse Communication: Mobility status ? ?Activity Tolerance: Patient tolerated treatment well ?Patient left: in bed ? ?OT Visit Diagnosis: Unsteadiness on feet (R26.81);Other abnormalities of gait and mobility (R26.89);Muscle weakness (generalized) (M62.81)  ?              ?Time: 5462-7035 ?OT Time Calculation (min): 20 min ?Charges:  OT General Charges ?$OT Visit: 1 Visit ?OT Evaluation ?$OT Eval Moderate Complexity: 1 Mod ? ?Teddy Rebstock H., OTR/L ?Acute Rehabilitation ? ?Javelle Donigan Elane Yolanda Bonine ?07/06/2021, 3:57 PM ?

## 2021-07-06 NOTE — Progress Notes (Signed)
PT Cancellation and Discharge Note ? ?Patient Details ?Name: Margaret Owen ?MRN: [id number] ?DOB: 12-31-1975 ? ? ?Cancelled Treatment:    Reason Eval/Treat Not Completed: PT screened, no needs identified, will sign off ?Discussed pt with OT and RN, who report she is managing independently. ? ?Roney Marion, PT  ?Acute Rehabilitation Services ?Pager [phone] ?Office [phone] ? ? ? ?Colletta [place] ?07/06/2021, 1:26 PM ?

## 2021-07-06 NOTE — Consult Note (Addendum)
NEUROLOGY CONSULTATION NOTE  ? ?Date of service: July 06, 2021 ?Patient Name: Margaret Owen ?MRN:  [id number] ?DOB:  [birth year] ?Reason for consult: "L sided weakness" ?Requesting Provider: Veatrice Kells, MD ?_ _ _   _ __   _ __ _ _  __ __   _ __   __ _ ? ?History of Present Illness  ?Margaret Owen is a 46 y.o. female with PMH significant for hypertension, migraine who presents with headache that started at 1800.  She reports that around 2200, she had numbness that started in her left face and she went to see herself in the mirror and noted that she had mild asymmetry of her left face.  She felt that her left hand was weird and off and was difficult to control and her left leg was also difficult to control.  Husband had to hold her from the back to prevent her from falling. ? ?She has never had anything that is in the past.  She denies any headache at this time.  She has never had any had this in the past with her headaches. ? ?She does not smoke, no marijuana.  She does not drink alcohol.  No history of diabetes, reports hypertension but lost her BP monitor in January.  No hyperlipidemia. ? ?LKW: 2100 on 07/06/2021. ?mRS: 0 ?tNKASE: Not offered due to resolution of symptoms. ?Thrombectomy: Symptoms not consistent with LVO. ?NIHSS components Score: Comment  ?1a Level of Conscious 0'[x]'$  1'[]'$  2'[]'$  3'[]'$      ?1b LOC Questions 0'[x]'$  1'[]'$  2'[]'$       ?1c LOC Commands 0'[x]'$  1'[]'$  2'[]'$       ?2 Best Gaze 0'[x]'$  1'[]'$  2'[]'$       ?3 Visual 0'[x]'$  1'[]'$  2'[]'$  3'[]'$      ?4 Facial Palsy 0'[x]'$  1'[]'$  2'[]'$  3'[]'$      ?5a Motor Arm - left 0'[x]'$  1'[]'$  2'[]'$  3'[]'$  4'[]'$  UN'[]'$    ?5b Motor Arm - Right 0'[x]'$  1'[]'$  2'[]'$  3'[]'$  4'[]'$  UN'[]'$    ?6a Motor Leg - Left 0'[x]'$  1'[]'$  2'[]'$  3'[]'$  4'[]'$  UN'[]'$    ?6b Motor Leg - Right 0'[x]'$  1'[]'$  2'[]'$  3'[]'$  4'[]'$  UN'[]'$    ?7 Limb Ataxia 0'[x]'$  1'[]'$  2'[]'$  3'[]'$  UN'[]'$     ?8 Sensory 0'[x]'$  1'[]'$  2'[]'$  UN'[]'$      ?9 Best Language 0'[x]'$  1'[]'$  2'[]'$  3'[]'$      ?10 Dysarthria 0'[x]'$  1'[]'$  2'[]'$  UN'[]'$      ?11 Extinct. and Inattention 0'[x]'$  1'[]'$  2'[]'$       ?TOTAL: 0   ? ?  ?ROS  ? ?Constitutional Denies weight  loss, fever and chills.   ?HEENT Denies changes in vision and hearing.   ?Respiratory Denies SOB and cough.   ?CV Denies palpitations and CP   ?GI Denies abdominal pain, nausea, vomiting and diarrhea.   ?GU Denies dysuria and urinary frequency.   ?MSK Denies myalgia and joint pain.   ?Skin Denies rash and pruritus.   ?Neurological Denies headache and syncope.   ?Psychiatric Denies recent changes in mood. Denies anxiety and depression.   ? ?Past History  ? ?Past Medical History:  ?Diagnosis Date  ? Breakthrough bleeding associated with intrauterine device (IUD) 03/22/2017  ? Hypertension   ? IUD contraception 10/05/2016  ? Malpositioned IUD 04/14/2017  ? REmoved 04/14/17   ? MIGRAINES, HX OF 06/11/2008  ? Qualifier: Diagnosis of  By: Truett Mainland MD, Christine    ? Pedal edema 10/05/2016  ? ?Past Surgical History:  ?Procedure Laterality Date  ? CERVICAL CERCLAGE    ? CESAREAN SECTION    ?  2000,2003,2005  ? CESAREAN SECTION  08/17/1998  ? ?Family History  ?Problem Relation Age of Onset  ? Heart failure Father   ? Alcohol abuse Father   ? Heart disease Father 69  ?     died at 93  ? Hypertension Father   ? Stroke Mother   ? Hypertension Mother   ? Arthritis Mother   ? Asthma Mother   ? Diabetes Mother   ? Hyperlipidemia Mother   ? Glaucoma Mother   ? Breast cancer Maternal Aunt   ? Colon cancer Maternal Aunt   ? Stroke Maternal Aunt   ? Glaucoma Sister   ? Asthma Brother   ? ?Social History  ? ?Socioeconomic History  ? Marital status: Married  ?  Spouse name: Harrell Gave  ? Number of children: 3  ? Years of education: 42  ? Highest education level: Not on file  ?Occupational History  ? Occupation: Artist  ?  Comment: state of [place]  ?Tobacco Use  ? Smoking status: Never  ? Smokeless tobacco: Never  ?Vaping Use  ? Vaping Use: Never used  ?Substance and Sexual Activity  ? Alcohol use: Yes  ?  Comment: occasional  ? Drug use: No  ? Sexual activity: Yes  ?  Birth control/protection: Patch  ?Other Topics Concern  ? Not on file   ?Social History Narrative  ? Husband is a Stephanie Coup  ? Lives with husband Christopher-married 24 years  ? Three daughters: all live at home 50, 17,15   ? 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   ? Dog: Bandit   ?   ? Enjoys: gardening, flowers  ?   ? Diet: eat all food groups  ? Caffeine: 2 daily  ? Water: 2 bottles daily   ?   ? Wears seat belt   ? Does not use phone while driving   ? Smoke detectors at home   ? ?Social Determinants of Health  ? ?Financial Resource Strain: Not on file  ?Food Insecurity: Not on file  ?Transportation Needs: Not on file  ?Physical Activity: Not on file  ?Stress: Not on file  ?Social Connections: Not on file  ? ?No Known Allergies ? ?Medications  ?(Not in a hospital admission) ?  ? ?Vitals  ? ?Vitals:  ? 07/05/21 2324 07/05/21 2341 07/05/21 2343 07/05/21 2345  ?BP:    (!) 137/97  ?Pulse:  81 80 85  ?Resp:  '15 18 18  '$ ?Temp:      ?TempSrc:      ?SpO2: 100% 100% 100% 100%  ?  ? ?There is no height or weight on file to calculate BMI. ? ?Physical Exam  ? ?General: Laying comfortably in bed; in no acute distress.  ?HENT: Normal oropharynx and mucosa. Normal external appearance of ears and nose.  ?Neck: Supple, no pain or tenderness  ?CV: No JVD. No peripheral edema.  ?Pulmonary: Symmetric Chest rise. Normal respiratory effort.  ?Abdomen: Soft to touch, non-tender.  ?Ext: No cyanosis, edema, or deformity  ?Skin: No rash. Normal palpation of skin.   ?Musculoskeletal: Normal digits and nails by inspection. No clubbing.  ? ?Neurologic Examination  ?Mental status/Cognition: Alert, oriented to self, place, month and year, good attention.  ?Speech/language: Fluent, comprehension intact, object naming intact, repetition intact.  ?Cranial nerves:  ? CN II Pupils equal and reactive to light, no VF deficits   ? CN III,IV,VI EOM intact, no gaze preference or deviation, no nystagmus   ? CN V  normal sensation in V1, V2, and V3 segments bilaterally   ? CN VII no asymmetry, no nasolabial fold flattening    ? CN VIII normal hearing to speech   ? CN IX & X normal palatal elevation, no uvular deviation   ? CN XI 5/5 head turn and 5/5 shoulder shrug bilaterally   ? CN XII midline tongue protrusion   ? ?Motor:  ?Muscle bulk: normal, tone normal, pronator drift none tremor none ?Mvmt Root Nerve  Muscle Right Left Comments  ?SA C5/6 Ax Deltoid 5 5   ?EF C5/6 Mc Biceps 5 5   ?EE C6/7/8 Rad Triceps 5 5   ?WF C6/7 Med FCR     ?WE C7/8 PIN ECU     ?F Ab C8/T1 U ADM/FDI 5 5   ?HF L1/2/3 Fem Illopsoas 5 5   ?KE L2/3/4 Fem Quad 5 5   ?DF L4/5 D Peron Tib Ant 5 5   ?PF S1/2 Tibial Grc/Sol 5 5   ? ?Reflexes: ? Right Left Comments  ?Pectoralis     ? Biceps (C5/6) 2 2   ?Brachioradialis (C5/6) 2 2   ? Triceps (C6/7) 2 2   ? Patellar (L3/4) 2 2   ? Achilles (S1)     ? Hoffman     ? Plantar     ?Jaw jerk   ? ?Sensation: ? Light touch Intact throughout  ? Pin prick   ? Temperature   ? Vibration   ?Proprioception   ? ?Coordination/Complex Motor:  ?- Finger to Nose intact BL ?- Heel to shin intact BL ?- Rapid alternating movement are normal ?- Gait: deferred. ? ?Labs  ? ?CBC:  ?Recent Labs  ?Lab 07/05/21 ?2327 07/05/21 ?2328  ?WBC 9.4  --   ?NEUTROABS 5.9  --   ?HGB 8.0* 9.2*  ?HCT 27.9* 27.0*  ?MCV 70.6*  --   ?PLT 329  --   ? ? ?Basic Metabolic Panel:  ?Lab Results  ?Component Value Date  ? NA 138 07/05/2021  ? K 3.4 (L) 07/05/2021  ? CO2 27 11/14/2019  ? GLUCOSE 96 07/05/2021  ? BUN 10 07/05/2021  ? CREATININE 0.90 07/05/2021  ? CALCIUM 8.7 11/14/2019  ? GFRNONAA 110 11/14/2019  ? GFRAA 128 11/14/2019  ? ?Lipid Panel:  ?Lab Results  ?Component Value Date  ? LDLCALC 115 (H) 11/14/2019  ? ?HgbA1c:  ?Lab Results  ?Component Value Date  ? HGBA1C 5.4 11/14/2019  ? ?Urine Drug Screen: No results found for: LABOPIA, COCAINSCRNUR, [place], [place], THCU, LABBARB  ?Alcohol Level No results found for: ETH ? ?CT Head without contrast(Personally reviewed): ?CTH was negative for a large hypodensity concerning for a large territory infarct or  hyperdensity concerning for an ICH ? ?MR Angio head without contrast and Carotid Duplex BL: ?pending ? ?MRI Brain: ?pending ? ? ?Impression  ? ?Margaret Owen is a 46 y.o. female with PMH significant for h

## 2021-07-06 NOTE — H&P (Signed)
?History and Physical  ? ? ?Margaret Owen:[id number] DOB: 07-23-1975 DOA: 07/05/2021 ? ?DOS: the patient was seen and examined on 07/05/2021 ? ?PCP: Lindell Spar, MD  ? ?Patient coming from: Home ? ?I have personally briefly reviewed patient's old medical records in [place] ? ?CC: left facial twitching, left UE numbness and weakness ?HPI: ?Very pleasant 46 year old African-American female with a history of hypertension, allergic rhinitis, remote history of migraine headaches presents to the ER today with sudden onset of left upper extremity numbness, weakness, left hand paresthesias, some mild slurred speech and some facial twitching around 10 PM.  Patient states that she was at the grocery store around 6 PM.  She states that she felt kind of hungry had a mild headache.  She states that she felt hungry.  She opened some beef jerky and started eating that.  She went home and put away the groceries.  She had dinner.  She had [place] food.  She states that around 10 PM, she noticed herself feeling quite odd.  She went to the bathroom.  She is also with left facial twitching.  She tried to talk to her husband but was unable to get any words out.  She states that she felt weakness and numbness in her left hand and her arm.  She states that she tried to hold her iPhone in her left hand and dropped it.  She also noticed some clumsiness in her left leg.  She came to the ED for evaluation. ? ?Patient denies any nausea, vomiting.  No chest pain. ? ?On arrival temp 98.2 heart rate 100 blood pressure 139/87 sats 100% on room air. ? ?Currently patient's symptoms are resolved. ? ?Lab work  ?White count 9.4, hemoglobin 8.0, platelets 329 ? ?Sodium 13 6, potassium 3.4, BUN of 8, creatinine 0.8 ? ?COVID and flu test are both negative. ? ?Code stroke was called.  Patient seen by neurologist. ? ?CT head was negative for any acute intracranial abnormality. ? ?[facility] hospitalist contacted for admission.  ? ?ED Course:  code stroke called. CT head negative.  ? ?Review of Systems:  ?Review of Systems  ?Constitutional: Negative.   ?HENT: Negative.    ?Eyes: Negative.   ?Respiratory: Negative.    ?Cardiovascular: Negative.   ?Gastrointestinal: Negative.   ?Genitourinary: Negative.   ?Musculoskeletal: Negative.   ?Skin: Negative.   ?Neurological:  Positive for tingling, focal weakness and headaches.  ?Endo/Heme/Allergies: Negative.   ?Psychiatric/Behavioral: Negative.    ?All other systems reviewed and are negative. ? ?Past Medical History:  ?Diagnosis Date  ? Bell palsy   ? when she was a teenager  ? Breakthrough bleeding associated with intrauterine device (IUD) 03/22/2017  ? Hypertension   ? IUD contraception 10/05/2016  ? Malpositioned IUD 04/14/2017  ? REmoved 04/14/17   ? MIGRAINES, HX OF 06/11/2008  ? Qualifier: Diagnosis of  By: Truett Mainland MD, Christine    ? Pedal edema 10/05/2016  ? ? ?Past Surgical History:  ?Procedure Laterality Date  ? CERVICAL CERCLAGE    ? CESAREAN SECTION    ? 2000,2003,2005  ? CESAREAN SECTION  08/17/1998  ? ? ? reports that she has never smoked. She has never used smokeless tobacco. She reports current alcohol use. She reports that she does not use drugs. ? ?No Known Allergies ? ?Family History  ?Problem Relation Age of Onset  ? Heart failure Father   ? Alcohol abuse Father   ? Heart disease Father 17  ?  died at 84  ? Hypertension Father   ? Stroke Mother   ? Hypertension Mother   ? Arthritis Mother   ? Asthma Mother   ? Diabetes Mother   ? Hyperlipidemia Mother   ? Glaucoma Mother   ? Breast cancer Maternal Aunt   ? Colon cancer Maternal Aunt   ? Stroke Maternal Aunt   ? Glaucoma Sister   ? Asthma Brother   ? ? ?Prior to Admission medications   ?Medication Sig Start Date End Date Taking? Authorizing Provider  ?Acetaminophen (MIDOL PO) Take 2 tablets by mouth daily as needed (cramping).   Yes [provider]  ?amLODipine (NORVASC) 10 MG tablet Take 1 tablet (10 mg total) by mouth daily. 02/03/21   Yes Lindell Spar, MD  ?ibuprofen (ADVIL) 200 MG tablet Take 400 mg by mouth every 6 (six) hours as needed for headache or moderate pain.   Yes [provider]  ?levocetirizine (XYZAL) 5 MG tablet TAKE 1 TABLET(5 MG) BY MOUTH EVERY EVENING ?Patient taking differently: Take 5 mg by mouth every evening. 08/23/20  Yes Lindell Spar, MD  ?Naphazoline-Pheniramine (VISINE-A OP) Place 1 drop into both eyes daily as needed (dry eyes).   Yes [provider]  ?norelgestromin-ethinyl estradiol (ORTHO EVRA) 150-35 MCG/24HR transdermal patch Place 1 patch onto the skin once a week. ?Patient taking differently: Place 1 patch onto the skin every Sunday. 10/17/20  Yes Cresenzo-Dishmon, Joaquim Lai, CNM  ?oxymetazoline (AFRIN) 0.05 % nasal spray Place 1 spray into both nostrils daily as needed for congestion.   Yes [provider]  ?fluticasone (FLONASE) 50 MCG/ACT nasal spray SHAKE LIQUID AND USE 2 SPRAYS IN EACH NOSTRIL DAILY ?Patient not taking: Reported on 07/06/2021 08/23/20   Lindell Spar, MD  ? ? ?Physical Exam: ?Vitals:  ? 07/05/21 2343 07/05/21 2345 07/06/21 0000 07/06/21 0030  ?BP:  (!) 137/97 (!) 141/93 (!) 141/90  ?Pulse: 80 85 83 75  ?Resp: '18 18 18 20  '$ ?Temp:      ?TempSrc:      ?SpO2: 100% 100% 100% 100%  ? ? ?Physical Exam ?Vitals and nursing note reviewed.  ?Constitutional:   ?   General: She is not in acute distress. ?   Appearance: Normal appearance. She is not ill-appearing, toxic-appearing or diaphoretic.  ?HENT:  ?   Head: Normocephalic and atraumatic.  ?   Nose: Nose normal.  ?Eyes:  ?   General: No scleral icterus.    ?   Right eye: No discharge.     ?   Left eye: No discharge.  ?Cardiovascular:  ?   Rate and Rhythm: Normal rate and regular rhythm.  ?   Heart sounds: Murmur heard.  ?   Comments: Quite 1/6 SEM LUSB ?Pulmonary:  ?   Effort: Pulmonary effort is normal. No respiratory distress.  ?   Breath sounds: Normal breath sounds. No wheezing or rales.  ?Abdominal:  ?   General:  Abdomen is flat. Bowel sounds are normal. There is no distension.  ?   Palpations: Abdomen is soft.  ?   Tenderness: There is no abdominal tenderness. There is no guarding or rebound.  ?Musculoskeletal:  ?   Right lower leg: No edema.  ?   Left lower leg: No edema.  ?Skin: ?   General: Skin is warm and dry.  ?   Capillary Refill: Capillary refill takes less than 2 seconds.  ?Neurological:  ?   General: No focal deficit present.  ?  Mental Status: She is alert and oriented to person, place, and time.  ?   Cranial Nerves: Cranial nerves 2-12 are intact.  ?   Motor: No weakness.  ?   Coordination: Finger-Nose-Finger Test and Heel to [employer] normal. Rapid alternating movements normal.  ?  ? ?Labs on Admission: I have personally reviewed following labs and imaging studies ? ?CBC: ?Recent Labs  ?Lab 07/05/21 ?2327 07/05/21 ?2328  ?WBC 9.4  --   ?NEUTROABS 5.9  --   ?HGB 8.0* 9.2*  ?HCT 27.9* 27.0*  ?MCV 70.6*  --   ?PLT 329  --   ? ?Basic Metabolic Panel: ?Recent Labs  ?Lab 07/05/21 ?2327 07/05/21 ?2328  ?NA 136 138  ?K 3.4* 3.4*  ?CL 104 103  ?CO2 21*  --   ?GLUCOSE 97 96  ?BUN 8 10  ?CREATININE 0.87 0.90  ?CALCIUM 8.4*  --   ? ?GFR: ?CrCl cannot be calculated (Unknown ideal weight.). ?Liver Function Tests: ?Recent Labs  ?Lab 07/05/21 ?2327  ?AST 23  ?ALT 9  ?ALKPHOS 45  ?BILITOT 0.4  ?PROT 6.1*  ?ALBUMIN 3.0*  ? ?No results for input(s): LIPASE, AMYLASE in the last 168 hours. ?No results for input(s): AMMONIA in the last 168 hours. ?Coagulation Profile: ?Recent Labs  ?Lab 07/05/21 ?2327  ?INR 1.0  ? ?Cardiac Enzymes: ?No results for input(s): CKTOTAL, CKMB, CKMBINDEX, TROPONINI, TROPONINIHS in the last 168 hours. ?BNP (last 3 results) ?No results for input(s): PROBNP in the last 8760 hours. ?HbA1C: ?No results for input(s): HGBA1C in the last 72 hours. ?CBG: ?Recent Labs  ?Lab 07/05/21 ?2342  ?GLUCAP 101*  ? ?Lipid Profile: ?No results for input(s): CHOL, HDL, LDLCALC, TRIG, CHOLHDL, LDLDIRECT in the last 72  hours. ?Thyroid Function Tests: ?No results for input(s): TSH, T4TOTAL, FREET4, T3FREE, THYROIDAB in the last 72 hours. ?Anemia Panel: ?No results for input(s): VITAMINB12, FOLATE, FERRITIN, TIBC, IRON, RETICCTPC

## 2021-07-06 NOTE — Assessment & Plan Note (Addendum)
Admit to observation telemetry bed. See neurology recommendations. ? ?Frequent Neuro checks per stroke unit protocol ?- Recommend brain imaging with MRI Brain without contrast ?- Recommend Vascular imaging with MRA Angio Head without contrast and US Carotid doppler ?- Recommend obtaining TTE  ?- Recommend obtaining Lipid panel with LDL ?- Please start statin if LDL > 70 ?- Recommend HbA1c ?- Antithrombotic - Aspirin '81mg'$  daily. ?- Recommend DVT ppx ?- SBP goal - permissive hypertension first 24 h < 220/110. Held home meds.  ?- Recommend Telemetry monitoring for arrythmia ?- Recommend bedside swallow screen prior to PO intake. ?- Stroke education booklet ?- Recommend PT/OT/SLP consult ?- Recommend Urine Tox screen. ?

## 2021-07-06 NOTE — Assessment & Plan Note (Signed)
Allow for permissive HTN for next 24 hours. per neurology consult(keep SBP<220). Hold norvasc. ?

## 2021-07-06 NOTE — ED Notes (Signed)
Patient transported to MRI 

## 2021-07-06 NOTE — Progress Notes (Signed)
?  Transition of Care (TOC) Screening Note ? ? ?Patient Details  ?Name: Margaret Owen ?Date of Birth: 09-26-1975 ? ? ?Transition of Care (TOC) CM/SW Contact:    ?Pollie Friar, RN ?Phone Number: ?07/06/2021, 1:09 PM ? ? ? ?Transition of Care Department [facility]) has reviewed patient. We will continue to monitor patient advancement through interdisciplinary progression rounds. If new patient transition needs arise, please place a TOC consult. ?  ?

## 2021-07-06 NOTE — Progress Notes (Addendum)
STROKE TEAM PROGRESS NOTE  ? ?SUBJECTIVE (INTERVAL HISTORY) ?Her sister is at the bedside.  Overall her condition is completely resolved.  Patient stated that she has migraine history since age of 46, however very sparse episode, average 2/year likely.  2 weeks ago she had right frontal headache, yesterday evening she had some right frontal headache but resolved before her episode of left face and hand numbness weakness.  MRI showed right MCA scattered punctate infarcts.  Work-up underway. ? ? ?OBJECTIVE ?Temp:  [98.2 ?F (36.8 ?C)-98.7 ?F (37.1 ?C)] 98.3 ?F (36.8 ?C) (03/19 2000) ?Pulse Rate:  [67-100] 73 (03/19 2000) ?Cardiac Rhythm: Normal sinus rhythm (03/19 0700) ?Resp:  [14-20] 20 (03/19 2000) ?BP: (124-141)/(79-97) 133/80 (03/19 2000) ?SpO2:  [98 %-100 %] 98 % (03/19 2000) ? ?Recent Labs  ?Lab 07/05/21 ?2342  ?GLUCAP 101*  ? ?Recent Labs  ?Lab 07/05/21 ?2327 07/05/21 ?2328  ?NA 136 138  ?K 3.4* 3.4*  ?CL 104 103  ?CO2 21*  --   ?GLUCOSE 97 96  ?BUN 8 10  ?CREATININE 0.87 0.90  ?CALCIUM 8.4*  --   ? ?Recent Labs  ?Lab 07/05/21 ?2327  ?AST 23  ?ALT 9  ?ALKPHOS 45  ?BILITOT 0.4  ?PROT 6.1*  ?ALBUMIN 3.0*  ? ?Recent Labs  ?Lab 07/05/21 ?2327 07/05/21 ?2328  ?WBC 9.4  --   ?NEUTROABS 5.9  --   ?HGB 8.0* 9.2*  ?HCT 27.9* 27.0*  ?MCV 70.6*  --   ?PLT 329  --   ? ?No results for input(s): CKTOTAL, CKMB, CKMBINDEX, TROPONINI in the last 168 hours. ?Recent Labs  ?  07/05/21 ?2327  ?LABPROT 13.6  ?INR 1.0  ? ?Recent Labs  ?  07/06/21 ?0344  ?COLORURINE STRAW*  ?LABSPEC 1.015  ?PHURINE 8.0  ?GLUCOSEU NEGATIVE  ?HGBUR MODERATE*  ?BILIRUBINUR NEGATIVE  ?KETONESUR NEGATIVE  ?PROTEINUR NEGATIVE  ?NITRITE NEGATIVE  ?LEUKOCYTESUR NEGATIVE  ?  ?   ?Component Value Date/Time  ? CHOL 174 07/06/2021 0623  ? TRIG 79 07/06/2021 0623  ? HDL 42 07/06/2021 0623  ? CHOLHDL 4.1 07/06/2021 0623  ? VLDL 16 07/06/2021 0623  ? [place] 116 (H) 07/06/2021 8185  ? [place] 115 (H) 11/14/2019 6314  ? ?Lab Results  ?Component Value Date  ? HGBA1C  5.6 07/06/2021  ? ?   ?Component Value Date/Time  ? [place] DETECTED 07/06/2021 0344  ? [place] DETECTED 07/06/2021 0344  ? [place] DETECTED 07/06/2021 0344  ? AMPHETMU NONE DETECTED 07/06/2021 0344  ? [place] DETECTED 07/06/2021 0344  ? [place] DETECTED 07/06/2021 0344  ?  ?Recent Labs  ?Lab 07/05/21 ?2327  ?ETH <10  ? ? ?I have personally reviewed the radiological images below and agree with the radiology interpretations. ? ?CT ANGIO HEAD NECK W WO CM ? ?Result Date: 07/06/2021 ?CLINICAL DATA:  46 year old female with headache since 1800 hours. 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 EXAM: CT ANGIOGRAPHY HEAD AND NECK TECHNIQUE: Multidetector CT imaging of the head and neck was performed using the standard protocol during bolus administration of intravenous contrast. Multiplanar CT image reconstructions and MIPs were obtained to evaluate the vascular anatomy. Carotid stenosis measurements (when applicable) are obtained utilizing NASCET criteria, using the distal internal carotid diameter as the denominator. RADIATION DOSE REDUCTION: This exam was performed according to the departmental dose-optimization program which includes automated exposure control, adjustment of the mA and/or kV according to patient size and/or  use of iterative reconstruction technique. CONTRAST:  102m OMNIPAQUE IOHEXOL 350 MG/ML SOLN COMPARISON:  Brain MRI and intracranial MRA 0506 hours today. FINDINGS: CT HEAD Brain: The small acute right superior MCA territory infarcts are occult by CT. No midline shift, ventriculomegaly, mass effect, evidence of mass lesion, or acute intracranial hemorrhage. Calvarium and skull base: No acute osseous abnormality identified. Paranasal sinuses: Visualized paranasal sinuses and mastoids are stable and well aerated. Orbits: Visualized orbits and scalp soft tissues are within normal limits. CTA NECK  Skeleton: Poor dentition. No acute osseous abnormality identified. Spina bifida occulta at C7 (normal variant). Upper chest: Mild upper lung atelectasis. Negative visible superior mediastinum. Other neck: No acute neck soft tissue finding. Aortic arch: 3 vessel arch configuration. No arch atherosclerosis. Right carotid system: Mild motion or pulsation artifact at the thoracic inlet. Partially retropharyngeal course of the right carotid including a retropharyngeal carotid bifurcation. No atherosclerosis or stenosis. Left carotid system: Negative. Vertebral arteries: Negative. Codominant cervical vertebral arteries. CTA HEAD Posterior circulation: Codominant distal vertebral arteries without plaque or stenosis. Normal PICA origins and vertebrobasilar junction. Patent basilar artery without stenosis. Normal SCA and PCA origins. Posterior communicating arteries are diminutive or absent. Bilateral PCA branches are within normal limits. Anterior circulation: Both ICA siphons are patent without plaque or stenosis. Patent carotid termini. Normal MCA and ACA origins. Negative anterior communicating artery. Bilateral ACA branches are within normal limits. Left MCA M1 segment and bifurcation are patent without stenosis. Right MCA M1 segment and bifurcation are patent without stenosis. Bilateral MCA branches are within normal limits. Venous sinuses: Patent. Anatomic variants: None. Review of the MIP images confirms the above findings IMPRESSION: 1. Negative CTA head and neck.  No atherosclerosis or stenosis. 2. Known small acute right MCA infarcts are occult by CT. No new intracranial abnormality. 3. Poor dentition. Electronically Signed   By: HGenevie AnnM.D.   On: 07/06/2021 08:58  ? ?MR ANGIO HEAD WO CONTRAST ? ?Result Date: 07/06/2021 ?CLINICAL DATA:  46year old female code stroke presentation. Headache, left side numbness and slurred speech. Some symptoms now resolved. EXAM: MRI HEAD WITHOUT CONTRAST MRA HEAD WITHOUT CONTRAST  TECHNIQUE: Multiplanar, multi-echo pulse sequences of the brain and surrounding structures were acquired without intravenous contrast. Angiographic images of the Circle of Willis were acquired using MRA technique without intravenous contrast. COMPARISON:  Head CT 07/05/2021. FINDINGS: MRI HEAD FINDINGS Brain: There are roughly 4 small foci of abnormal diffusion in the right middle frontal gyrus, precentral region, affecting cortex and subcortical white matter on series 5, images 84-87. These are restricted on ADC. Subtle if any associated T2 and FLAIR hyperintensity. No hemorrhage or mass effect. No other restricted diffusion. No midline shift, mass effect, evidence of mass lesion, ventriculomegaly, extra-axial collection or acute intracranial hemorrhage. Cervicomedullary junction and pituitary are within normal limits. Normal cerebral volume. There are small but mildly to moderately advanced for age scattered bilateral subcortical white matter T2 and FLAIR hyperintense foci in the frontal lobes on series 11, image 16. No cortical encephalomalacia or chronic cerebral blood products identified. Negative deep gray nuclei, brainstem and cerebellum. Vascular: Major intracranial vascular flow voids are preserved. Skull and upper cervical spine: Nonspecific decreased T1 marrow signal in the visible spine. Skull bone marrow signal is within normal limits. Sinuses/Orbits: Negative orbits. Mild ethmoid sinus mucosal thickening. Other: Mastoids are clear. Visible internal auditory structures appear normal. Negative visible scalp and face. MRA HEAD FINDINGS Anterior circulation: Antegrade flow in both ICA siphons appears symmetric. There is some susceptibility  artifact related to hyper pneumatized sphenoid sinus and central skull base. No convincing siphon stenosis. Symmetric antegrade flow at the carotid termini, MCA and ACA origins. Anterior communicating artery and visible ACA branches are within normal limits. Left MCA M1,  bifurcation, and visible left MCA branches are within normal limits. Right MCA M1 segment and bifurcation are patent without stenosis. Visible right MCA branches are within normal limits, no branch occlu

## 2021-07-06 NOTE — Evaluation (Signed)
Speech Language Pathology Evaluation ?Patient Details ?Name: Margaret Owen ?MRN: [id number] ?DOB: 1975/11/25 ?Today's Date: 07/06/2021 ?Time: 1140-1157 ?SLP Time Calculation (min) (ACUTE ONLY): 17 min ? ?Problem List:  ?Patient Active Problem List  ? Diagnosis Date Noted  ? TIA (transient ischemic attack) 07/06/2021  ? Hx of migraines 07/06/2021  ? Encounter for screening colonoscopy 08/28/2020  ? Allergic rhinitis 05/22/2020  ? Overweight with body mass index (BMI) of 29 to 29.9 in adult 10/26/2019  ? Essential hypertension 07/27/2019  ? Anemia 07/27/2019  ? Intramural and submucous leiomyoma of uterus 07/14/2017  ? Fibroids, submucosal 04/14/2017  ? ?Past Medical History:  ?Past Medical History:  ?Diagnosis Date  ? Bell palsy   ? when she was a teenager  ? Breakthrough bleeding associated with intrauterine device (IUD) 03/22/2017  ? Hypertension   ? IUD contraception 10/05/2016  ? Malpositioned IUD 04/14/2017  ? REmoved 04/14/17   ? MIGRAINES, HX OF 06/11/2008  ? Qualifier: Diagnosis of  By: Truett Mainland MD, Christine    ? Pedal edema 10/05/2016  ? ?Past Surgical History:  ?Past Surgical History:  ?Procedure Laterality Date  ? CERVICAL CERCLAGE    ? CESAREAN SECTION    ? 2000,2003,2005  ? CESAREAN SECTION  08/17/1998  ? ?HPI:  ?Pt is a 46 year old female who presented to the ED with sudden onset of left upper extremity numbness, weakness, left hand paresthesias, some mild slurred speech and some facial twitching. MRI brain: Positive for a small cluster of acute infarcts in the precentral right middle frontal gyrus, right MCA territory. PMH: hypertension, allergic rhinitis, remote history of migraine headaches.  ? ?Assessment / Plan / Recommendation ?Clinical Impression ? Pt participated in speech-language-cognition evaluation evaluation. She stated that she has a bachelor's degree and is employed full time as a Orthoptist. Pt reported that she has difficulty completing mental math, but denied any baseline or acute  deficits in speech, language, or cognition. The [facility] Mental Status Examination was completed to evaluate the pt's cognitive-linguistic skills. She achieved a score of 26/30 which is slightly below the normal limits of 27 or more out of 30. Points were lost during a tasks which required her to complete mental mathematical calculations, during delayed recall (pt recalled "horse" instead of "house" after divergent naming of animals), and with a backward digit span task with four digits.  No speech or language deficits were noted and her performance on informal cognitive-linguistic tasks was within normal limits. Further acute skilled SLP services are not clinically indicated at this time. However, pt was advised to follow up with her PCP if she notices increased difficulty with cognitively-demanding tasks when she returns to work. She verbalized understanding as well as agreement with plan of care. ?   ?SLP Assessment ? SLP Recommendation/Assessment: Patient does not need any further [place] [facility] ?SLP Visit Diagnosis: Cognitive communication deficit (R41.841)  ?  ?Recommendations for follow up therapy are one component of a multi-disciplinary discharge planning process, led by the attending physician.  Recommendations may be updated based on patient status, additional functional criteria and insurance authorization. ?   ?Follow Up Recommendations ? No SLP follow up  ?  ?Assistance Recommended at Discharge ?    ?Functional Status Assessment Patient has not had a recent decline in their functional status  ?Frequency and Duration    ?  ?  ?   ?SLP Evaluation ?Cognition ? Overall Cognitive Status: Within Functional Limits for tasks assessed ?Arousal/Alertness: Awake/alert ?Orientation Level: Oriented X4 ?  Year: 2023 ?Month: March ?Day of Week: Correct ?Attention: Focused;Sustained ?Focused Attention: Appears intact ?Sustained Attention: Appears intact ?Memory: Impaired ?Memory  Impairment:  (Immediate: 5/5; delayed: 5/5) ?Awareness: Appears intact ?Problem Solving: Impaired ?Problem Solving Impairment: Verbal complex (Money: 1/3 (pt reported being poor at math at baseline); time: 2/2) ?Executive Function: Sequencing;Organizing ?Sequencing: Appears intact (clock drawing: 4/4) ?Organizing: Appears intact (backward digit span: 1/2)  ?  ?   ?Comprehension ? Auditory Comprehension ?Overall Auditory Comprehension: Appears within functional limits for tasks assessed ?Yes/No Questions: Within Functional Limits ?Commands: Within Functional Limits ?Conversation: Complex ?Reading Comprehension ?Reading Status: Within funtional limits  ?  ?Expression Expression ?Primary Mode of Expression: Verbal ?Verbal Expression ?Overall Verbal Expression: Appears within functional limits for tasks assessed ?Initiation: No impairment ?Level of Generative/Spontaneous Verbalization: Conversation ?Repetition: No impairment ?Naming: No impairment   ?Oral / Motor ? Oral Motor/Sensory Function ?Overall Oral Motor/Sensory Function: Within functional limits ?Motor Speech ?Overall Motor Speech: Appears within functional limits for tasks assessed ?Respiration: Within functional limits ?Phonation: Normal ?Resonance: Within functional limits ?Articulation: Within functional limitis ?Intelligibility: Intelligible ?Motor Planning: Witnin functional limits ?Motor Speech Errors: Not applicable   ?        ?Shresta Risden I. Hardin Negus, [place], CCC-SLP ?Acute Rehabilitation Services ?Office number [phone] ?Pager [phone] ? ?Horton Marshall ?07/06/2021, 12:10 PM ? ? ? ? ?

## 2021-07-06 NOTE — Progress Notes (Signed)
?  Echocardiogram ?2D Echocardiogram has been performed. ? ?Margaret Owen ?07/06/2021, 9:30 AM ?

## 2021-07-06 NOTE — Subjective & Objective (Signed)
CC: left facial twitching, left UE numbness and weakness ?HPI: ?Very pleasant 46 year old African-American female with a history of hypertension, allergic rhinitis, remote history of migraine headaches presents to the ER today with sudden onset of left upper extremity numbness, weakness, left hand paresthesias, some mild slurred speech and some facial twitching around 10 PM.  Patient states that she was at the grocery store around 6 PM.  She states that she felt kind of hungry had a mild headache.  She states that she felt hungry.  She opened some beef jerky and started eating that.  She went home and put away the groceries.  She had dinner.  She had [place] food.  She states that around 10 PM, she noticed herself feeling quite odd.  She went to the bathroom.  She is also with left facial twitching.  She tried to talk to her husband but was unable to get any words out.  She states that she felt weakness and numbness in her left hand and her arm.  She states that she tried to hold her iPhone in her left hand and dropped it.  She also noticed some clumsiness in her left leg.  She came to the ED for evaluation. ? ?Patient denies any nausea, vomiting.  No chest pain. ? ?On arrival temp 98.2 heart rate 100 blood pressure 139/87 sats 100% on room air. ? ?Currently patient's symptoms are resolved. ? ?Lab work  ?White count 9.4, hemoglobin 8.0, platelets 329 ? ?Sodium 13 6, potassium 3.4, BUN of 8, creatinine 0.8 ? ?COVID and flu test are both negative. ? ?Code stroke was called.  Patient seen by neurologist. ? ?CT head was negative for any acute intracranial abnormality. ? ?[facility] hospitalist contacted for admission. ?

## 2021-07-06 NOTE — Assessment & Plan Note (Signed)
No recent migraine headaches. Mostly had migraine during high school years. ?

## 2021-07-06 NOTE — Progress Notes (Signed)
?PROGRESS NOTE ? ? ? ?Margaret Owen  OZD:[id number] DOB: [birth year] DOA: 07/05/2021 ?PCP: Lindell Spar, MD ? ? ?Brief Narrative:  ?Very pleasant 46 year old African-American female with a history of hypertension, allergic rhinitis, remote history of migraine headaches presents to the ER today with sudden onset of left upper extremity numbness, weakness, left hand paresthesias, some mild slurred speech and some facial twitching around 10 PM.  Patient states that she was at the grocery store around 6 PM.  She states that she felt kind of hungry had a mild headache.  She states that she felt hungry.  She opened some beef jerky and started eating that.  She went home and put away the groceries.  She had dinner.  She had [place] food.  She states that around 10 PM, she noticed herself feeling quite odd.  She went to the bathroom.  She is also with left facial twitching.  She tried to talk to her husband but was unable to get any words out.  She states that she felt weakness and numbness in her left hand and her arm.  She states that she tried to hold her iPhone in her left hand and dropped it.  She also noticed some clumsiness in her left leg.  She came to the ED for evaluation. ?  ?Patient denies any nausea, vomiting.  No chest pain. ?  ?On arrival temp 98.2 heart rate 100 blood pressure 139/87 sats 100% on room air. ?  ?Currently patient's symptoms are resolved. ?  ?Lab work  ?White count 9.4, hemoglobin 8.0, platelets 329 ?  ?Sodium 13 6, potassium 3.4, BUN of 8, creatinine 0.8 ?  ?COVID and flu test are both negative. ?  ?Code stroke was called.  Patient seen by neurologist. ?  ?CT head was negative for any acute intracranial abnormality. ?  ?[facility] hospitalist contacted for admission.  ? ?Assessment & Plan: ?  ?Principal Problem: ?  TIA (transient ischemic attack) ?Active Problems: ?  Essential hypertension ?  Anemia ?  Hx of migraines ?  Allergic rhinitis ? ?Acute ischemic stroke: MRI confirms small cluster of  acute infarction in the precentral right middle frontal gyrus, right MCA territory.  Likely embolic.  Patient symptoms have completely resolved.  Neurology on board.  Patient on aspirin.  Transthoracic echo with normal ejection fraction.  Plan for TEE and neurology to touch base with cardiology to request to complete TEE tomorrow morning.  PT OT on board.  Passed swallow evaluation.  Will allow permissive hypertension, hold antihypertensives.  Neurology help. ? ?History of migraines: No recent attacks of migraine. ? ?DVT prophylaxis: enoxaparin (LOVENOX) injection 40 mg Start: 07/06/21 1000 ?SCD's Start: 07/06/21 0359 ?  Code Status: Full Code  ?Family Communication:  None present at bedside.  Plan of care discussed with patient in length and he/she verbalized understanding and agreed with it. ? ?Status is: Observation ?The patient will require care spanning > 2 midnights and should be moved to inpatient because: Needs TEE tomorrow morning. ? ? ?Estimated body mass index is 29.09 kg/m? as calculated from the following: ?  Height as of 08/28/20: '5\' 5"'$  (1.651 m). ?  Weight as of 08/28/20: 79.3 kg. ? ?  ?Nutritional Assessment: ?There is no height or weight on file to calculate BMI.Marland Kitchen ?Seen by dietician.  I agree with the assessment and plan as outlined below: ?Nutrition Status: ?  ?  ?  ? ?. ?Skin Assessment: ?I have examined the patient's skin and I agree with  the wound assessment as performed by the wound care RN as outlined below: ?  ? ?Consultants:  ?Neurology ? ?Procedures:  ?None ? ?Antimicrobials:  ?Anti-infectives (From admission, onward)  ? ? None  ? ?  ?  ? ? ?Subjective: ?Seen and examined earlier today.  She has no complaints.  All the symptoms that she came in with have resolved. ? ?Objective: ?Vitals:  ? 07/06/21 0430 07/06/21 0544 07/06/21 0600 07/06/21 1000  ?BP: 124/85 (!) 136/92 132/85 134/84  ?Pulse: 73 77 77 69  ?Resp: '14 16 18 18  '$ ?Temp:  98.7 ?F (37.1 ?C) 98.7 ?F (37.1 ?C) 98.6 ?F (37 ?C)  ?TempSrc:   Oral Oral Oral  ?SpO2: 100% 100% 99% 100%  ? ?No intake or output data in the 24 hours ending 07/06/21 1315 ?There were no vitals filed for this visit. ? ?Examination: ? ?General exam: Appears calm and comfortable  ?Respiratory system: Clear to auscultation. Respiratory effort normal. ?Cardiovascular system: S1 & S2 heard, RRR. No JVD, murmurs, rubs, gallops or clicks. No pedal edema. ?Gastrointestinal system: Abdomen is nondistended, soft and nontender. No organomegaly or masses felt. Normal bowel sounds heard. ?Central nervous system: Alert and oriented. No focal neurological deficits. ?Extremities: Symmetric 5 x 5 power. ?Skin: No rashes, lesions or ulcers ?Psychiatry: Judgement and insight appear normal. Mood & affect appropriate.  ? ? ?Data Reviewed: I have personally reviewed following labs and imaging studies ? ?CBC: ?Recent Labs  ?Lab 07/05/21 ?2327 07/05/21 ?2328  ?WBC 9.4  --   ?NEUTROABS 5.9  --   ?HGB 8.0* 9.2*  ?HCT 27.9* 27.0*  ?MCV 70.6*  --   ?PLT 329  --   ? ?Basic Metabolic Panel: ?Recent Labs  ?Lab 07/05/21 ?2327 07/05/21 ?2328  ?NA 136 138  ?K 3.4* 3.4*  ?CL 104 103  ?CO2 21*  --   ?GLUCOSE 97 96  ?BUN 8 10  ?CREATININE 0.87 0.90  ?CALCIUM 8.4*  --   ? ?GFR: ?CrCl cannot be calculated (Unknown ideal weight.). ?Liver Function Tests: ?Recent Labs  ?Lab 07/05/21 ?2327  ?AST 23  ?ALT 9  ?ALKPHOS 45  ?BILITOT 0.4  ?PROT 6.1*  ?ALBUMIN 3.0*  ? ?No results for input(s): LIPASE, AMYLASE in the last 168 hours. ?No results for input(s): AMMONIA in the last 168 hours. ?Coagulation Profile: ?Recent Labs  ?Lab 07/05/21 ?2327  ?INR 1.0  ? ?Cardiac Enzymes: ?No results for input(s): CKTOTAL, CKMB, CKMBINDEX, TROPONINI in the last 168 hours. ?BNP (last 3 results) ?No results for input(s): PROBNP in the last 8760 hours. ?HbA1C: ?Recent Labs  ?  07/06/21 ?0623  ?HGBA1C 5.6  ? ?CBG: ?Recent Labs  ?Lab 07/05/21 ?2342  ?GLUCAP 101*  ? ?Lipid Profile: ?Recent Labs  ?  07/06/21 ?0623  ?CHOL 174  ?HDL 42  ?LDLCALC 116*   ?TRIG 79  ?CHOLHDL 4.1  ? ?Thyroid Function Tests: ?No results for input(s): TSH, T4TOTAL, FREET4, T3FREE, THYROIDAB in the last 72 hours. ?Anemia Panel: ?Recent Labs  ?  07/06/21 ?0623 07/06/21 ?1022  ?VITAMINB12  --  113*  ?TIBC 529*  --   ?IRON 15*  --   ? ?Sepsis Labs: ?No results for input(s): PROCALCITON, LATICACIDVEN in the last 168 hours. ? ?Recent Results (from the past 240 hour(s))  ?Resp Panel by RT-PCR (Flu A&B, Covid) Nasopharyngeal Swab     Status: None  ? Collection Time: 07/05/21 11:16 PM  ? Specimen: Nasopharyngeal Swab; Nasopharyngeal(NP) swabs in vial transport medium  ?Result Value Ref Range Status  ?  SARS Coronavirus 2 by RT PCR NEGATIVE NEGATIVE Final  ?  Comment: (NOTE) ?SARS-CoV-2 target nucleic acids are NOT DETECTED. ? ?The SARS-CoV-2 RNA is generally detectable in upper respiratory ?specimens during the acute phase of infection. The lowest ?concentration of SARS-CoV-2 viral copies this assay can detect is ?138 copies/mL. A negative result does not preclude SARS-Cov-2 ?infection and should not be used as the sole basis for treatment or ?other patient management decisions. A negative result may occur with  ?improper specimen collection/handling, submission of specimen other ?than nasopharyngeal swab, presence of viral mutation(s) within the ?areas targeted by this assay, and inadequate number of viral ?copies(<138 copies/mL). A negative result must be combined with ?clinical observations, patient history, and epidemiological ?information. The expected result is Negative. ? ?Fact Sheet for Patients:  ?EntrepreneurPulse.com.au ? ?Fact Sheet for Healthcare Providers:  ?IncredibleEmployment.be ? ?This test is no t yet approved or cleared by the [place] FDA and  ?has been authorized for detection and/or diagnosis of SARS-CoV-2 by ?FDA under an Emergency Use Authorization (EUA). This EUA will remain  ?in effect (meaning this test can be used) for the  duration of the ?COVID-19 declaration under Section 564(b)(1) of the Act, 21 ?U.S.C.section 360bbb-3(b)(1), unless the authorization is terminated  ?or revoked sooner.  ? ? ?  ? Influenza A by PCR NEGAT

## 2021-07-06 NOTE — Assessment & Plan Note (Signed)
Check iron panel. 

## 2021-07-06 NOTE — Assessment & Plan Note (Signed)
Continue xyzal ?

## 2021-07-07 ENCOUNTER — Observation Stay (HOSPITAL_BASED_OUTPATIENT_CLINIC_OR_DEPARTMENT_OTHER): Payer: BC Managed Care – PPO

## 2021-07-07 DIAGNOSIS — I639 Cerebral infarction, unspecified: Secondary | ICD-10-CM

## 2021-07-07 DIAGNOSIS — D500 Iron deficiency anemia secondary to blood loss (chronic): Secondary | ICD-10-CM | POA: Diagnosis not present

## 2021-07-07 DIAGNOSIS — D649 Anemia, unspecified: Secondary | ICD-10-CM | POA: Diagnosis not present

## 2021-07-07 DIAGNOSIS — I158 Other secondary hypertension: Secondary | ICD-10-CM | POA: Diagnosis not present

## 2021-07-07 DIAGNOSIS — Z8669 Personal history of other diseases of the nervous system and sense organs: Secondary | ICD-10-CM | POA: Diagnosis not present

## 2021-07-07 DIAGNOSIS — G459 Transient cerebral ischemic attack, unspecified: Secondary | ICD-10-CM | POA: Diagnosis not present

## 2021-07-07 DIAGNOSIS — I10 Essential (primary) hypertension: Secondary | ICD-10-CM | POA: Diagnosis not present

## 2021-07-07 LAB — CBC WITH DIFFERENTIAL/PLATELET
Abs Immature Granulocytes: 0.05 10*3/uL (ref 0.00–0.07)
Basophils Absolute: 0.1 10*3/uL (ref 0.0–0.1)
Basophils Relative: 1 %
Eosinophils Absolute: 0.3 10*3/uL (ref 0.0–0.5)
Eosinophils Relative: 4 %
HCT: 27.6 % — ABNORMAL LOW (ref 36.0–46.0)
Hemoglobin: 8.2 g/dL — ABNORMAL LOW (ref 12.0–15.0)
Immature Granulocytes: 1 %
Lymphocytes Relative: 31 %
Lymphs Abs: 2.1 10*3/uL (ref 0.7–4.0)
MCH: 20.2 pg — ABNORMAL LOW (ref 26.0–34.0)
MCHC: 29.7 g/dL — ABNORMAL LOW (ref 30.0–36.0)
MCV: 68 fL — ABNORMAL LOW (ref 80.0–100.0)
Monocytes Absolute: 0.5 10*3/uL (ref 0.1–1.0)
Monocytes Relative: 8 %
Neutro Abs: 3.8 10*3/uL (ref 1.7–7.7)
Neutrophils Relative %: 55 %
Platelets: 466 10*3/uL — ABNORMAL HIGH (ref 150–400)
RBC: 4.06 MIL/uL (ref 3.87–5.11)
RDW: 19.9 % — ABNORMAL HIGH (ref 11.5–15.5)
WBC: 6.8 10*3/uL (ref 4.0–10.5)
nRBC: 0 % (ref 0.0–0.2)

## 2021-07-07 LAB — BASIC METABOLIC PANEL
Anion gap: 8 (ref 5–15)
BUN: 5 mg/dL — ABNORMAL LOW (ref 6–20)
CO2: 23 mmol/L (ref 22–32)
Calcium: 8.6 mg/dL — ABNORMAL LOW (ref 8.9–10.3)
Chloride: 105 mmol/L (ref 98–111)
Creatinine, Ser: 0.66 mg/dL (ref 0.44–1.00)
GFR, Estimated: >60 mL/min (ref >60)
Glucose, Bld: 91 mg/dL (ref 70–99)
Potassium: 3.6 mmol/L (ref 3.5–5.1)
Sodium: 136 mmol/L (ref 135–145)

## 2021-07-07 LAB — HOMOCYSTEINE: Homocysteine: 7.5 umol/L (ref 0.0–14.5)

## 2021-07-07 LAB — MAGNESIUM: Magnesium: 1.9 mg/dL (ref 1.7–2.4)

## 2021-07-07 LAB — RPR: RPR Ser Ql: NONREACTIVE

## 2021-07-07 MED ORDER — SODIUM CHLORIDE 0.9 % IV SOLN: INTRAVENOUS | Status: DC

## 2021-07-07 MED ORDER — CLOPIDOGREL BISULFATE 75 MG PO TABS
75.0000 mg | ORAL_TABLET | Freq: Every day | ORAL | Status: DC
  Administered 2021-07-08: 75 mg via ORAL
  Filled 2021-07-07 (×2): qty 1

## 2021-07-07 MED ORDER — SODIUM CHLORIDE 0.9 % IV SOLN: INTRAVENOUS | Status: AC

## 2021-07-07 NOTE — Progress Notes (Signed)
? ? ?  [facility] has been requested to perform a transesophageal echocardiogram on Ms. Margaret Owen for stroke.  After careful review of history and examination, the risks and benefits of transesophageal echocardiogram have been explained including risks of esophageal damage, perforation (1:10,000 risk), bleeding, pharyngeal hematoma as well as other potential complications associated with conscious sedation including aspiration, arrhythmia, respiratory failure and death. Alternatives to treatment were discussed, questions were answered. Patient is willing to proceed.  ?TEE - 07/08/21  @ 9am. NPO after midnight.  ? ?Leanor Kail, PA-C ?07/07/2021 5:03 PM   ?

## 2021-07-07 NOTE — Progress Notes (Incomplete)
{  Select Note:[id number]} 

## 2021-07-07 NOTE — Progress Notes (Signed)
?PROGRESS NOTE ? ? ? ?Margaret Owen  UKG:[id number] DOB: [birth year] DOA: 07/05/2021 ?PCP: Lindell Spar, MD ? ? ?Brief Narrative:  ?Very pleasant 46 year old African-American female with a history of hypertension, allergic rhinitis, remote history of migraine headaches presents to the ER today with sudden onset of left upper extremity numbness, weakness, left hand paresthesias, some mild slurred speech and some facial twitching around 10 PM.  Patient states that she was at the grocery store around 6 PM.  She states that she felt kind of hungry had a mild headache.  She states that she felt hungry.  She opened some beef jerky and started eating that.  She went home and put away the groceries.  She had dinner.  She had [place] food.  She states that around 10 PM, she noticed herself feeling quite odd.  She went to the bathroom.  She is also with left facial twitching.  She tried to talk to her husband but was unable to get any words out.  She states that she felt weakness and numbness in her left hand and her arm.  She states that she tried to hold her iPhone in her left hand and dropped it.  She also noticed some clumsiness in her left leg.  She came to the ED for evaluation. ?  ?Patient denies any nausea, vomiting.  No chest pain. ?  ?On arrival temp 98.2 heart rate 100 blood pressure 139/87 sats 100% on room air. ?  ?Currently patient's symptoms are resolved. ?  ?Lab work  ?White count 9.4, hemoglobin 8.0, platelets 329 ?  ?Sodium 13 6, potassium 3.4, BUN of 8, creatinine 0.8 ?  ?COVID and flu test are both negative. ?  ?Code stroke was called.  Patient seen by neurologist. ?  ?CT head was negative for any acute intracranial abnormality. ?  ?[facility] hospitalist contacted for admission.  ? ?Assessment & Plan: ?  ?Principal Problem: ?  TIA (transient ischemic attack) ?Active Problems: ?  Essential hypertension ?  Anemia ?  Hx of migraines ?  Allergic rhinitis ? ?Acute ischemic stroke: MRI confirms small cluster of  acute infarction in the precentral right middle frontal gyrus, right MCA territory.  Likely embolic.  Patient symptoms have completely resolved.  Neurology on board.  Patient on aspirin.  Transthoracic echo with normal ejection fraction.  Neurology had consulted cardiology yesterday and patient was scheduled to have TEE today however for some reason, this is rescheduled for tomorrow.   ? ?History of migraines: No recent attacks of migraine. ? ?DVT prophylaxis: enoxaparin (LOVENOX) injection 40 mg Start: 07/06/21 1000 ?SCD's Start: 07/06/21 0359 ?  Code Status: Full Code  ?Family Communication:  None present at bedside.  Plan of care discussed with patient in length and he/she verbalized understanding and agreed with it. ? ?Status is: Observation ?The patient will require care spanning > 2 midnights and should be moved to inpatient because: Needs TEE tomorrow morning. ? ? ?Estimated body mass index is 29.09 kg/m? as calculated from the following: ?  Height as of 08/28/20: '5\' 5"'$  (1.651 m). ?  Weight as of 08/28/20: 79.3 kg. ? ?  ?Nutritional Assessment: ?There is no height or weight on file to calculate BMI.Marland Kitchen ?Seen by dietician.  I agree with the assessment and plan as outlined below: ?Nutrition Status: ?  ?  ?  ? ?. ?Skin Assessment: ?I have examined the patient's skin and I agree with the wound assessment as performed by the wound care RN as outlined  below: ?  ? ?Consultants:  ?Neurology ? ?Procedures:  ?None ? ?Antimicrobials:  ?Anti-infectives (From admission, onward)  ? ? None  ? ?  ?  ? ? ?Subjective: ? ?Patient seen and examined.  She has no complaints.  She is eager to go home. ? ?Objective: ?Vitals:  ? 07/06/21 2000 07/07/21 0000 07/07/21 0400 07/07/21 0758  ?BP: 133/80 124/76 122/72 130/77  ?Pulse: 73 62 68 (!) 58  ?Resp: '20 20 20 18  '$ ?Temp: 98.3 ?F (36.8 ?C) 98.4 ?F (36.9 ?C) 98.2 ?F (36.8 ?C) 99 ?F (37.2 ?C)  ?TempSrc: Oral Oral Oral Oral  ?SpO2: 98% 100% 99% 100%  ? ? ?Intake/Output Summary (Last 24 hours)  at 07/07/2021 1150 ?Last data filed at 07/07/2021 1017 ?Gross per 24 hour  ?Intake 466.82 ml  ?Output --  ?Net 466.82 ml  ? ?There were no vitals filed for this visit. ? ?Examination: ? ?General exam: Appears calm and comfortable  ?Respiratory system: Clear to auscultation. Respiratory effort normal. ?Cardiovascular system: S1 & S2 heard, RRR. No JVD, murmurs, rubs, gallops or clicks. No pedal edema. ?Gastrointestinal system: Abdomen is nondistended, soft and nontender. No organomegaly or masses felt. Normal bowel sounds heard. ?Central nervous system: Alert and oriented. No focal neurological deficits. ?Extremities: Symmetric 5 x 5 power. ?Skin: No rashes, lesions or ulcers.  ?Psychiatry: Judgement and insight appear normal. Mood & affect appropriate.  ? ?Data Reviewed: I have personally reviewed following labs and imaging studies ? ?CBC: ?Recent Labs  ?Lab 07/05/21 ?2327 07/05/21 ?2328 07/07/21 ?0820  ?WBC 9.4  --  6.8  ?NEUTROABS 5.9  --  3.8  ?HGB 8.0* 9.2* 8.2*  ?HCT 27.9* 27.0* 27.6*  ?MCV 70.6*  --  68.0*  ?PLT 329  --  466*  ? ? ?Basic Metabolic Panel: ?Recent Labs  ?Lab 07/05/21 ?2327 07/05/21 ?2328 07/07/21 ?0820  ?NA 136 138 136  ?K 3.4* 3.4* 3.6  ?CL 104 103 105  ?CO2 21*  --  23  ?GLUCOSE 97 96 91  ?BUN 8 10 5*  ?CREATININE 0.87 0.90 0.66  ?CALCIUM 8.4*  --  8.6*  ?MG  --   --  1.9  ? ? ?GFR: ?CrCl cannot be calculated (Unknown ideal weight.). ?Liver Function Tests: ?Recent Labs  ?Lab 07/05/21 ?2327  ?AST 23  ?ALT 9  ?ALKPHOS 45  ?BILITOT 0.4  ?PROT 6.1*  ?ALBUMIN 3.0*  ? ? ?No results for input(s): LIPASE, AMYLASE in the last 168 hours. ?No results for input(s): AMMONIA in the last 168 hours. ?Coagulation Profile: ?Recent Labs  ?Lab 07/05/21 ?2327  ?INR 1.0  ? ? ?Cardiac Enzymes: ?No results for input(s): CKTOTAL, CKMB, CKMBINDEX, TROPONINI in the last 168 hours. ?BNP (last 3 results) ?No results for input(s): PROBNP in the last 8760 hours. ?HbA1C: ?Recent Labs  ?  07/06/21 ?0623  ?HGBA1C 5.6   ? ? ?CBG: ?Recent Labs  ?Lab 07/05/21 ?2342  ?GLUCAP 101*  ? ? ?Lipid Profile: ?Recent Labs  ?  07/06/21 ?0623  ?CHOL 174  ?HDL 42  ?LDLCALC 116*  ?TRIG 79  ?CHOLHDL 4.1  ? ? ?Thyroid Function Tests: ?Recent Labs  ?  07/06/21 ?1022  ?TSH 1.356  ? ?Anemia Panel: ?Recent Labs  ?  07/06/21 ?0623 07/06/21 ?1022  ?VITAMINB12  --  113*  ?TIBC 529*  --   ?IRON 15*  --   ? ? ?Sepsis Labs: ?No results for input(s): PROCALCITON, LATICACIDVEN in the last 168 hours. ? ?Recent Results (from the past 240 hour(s))  ?Resp  Panel by RT-PCR (Flu A&B, Covid) Nasopharyngeal Swab     Status: None  ? Collection Time: 07/05/21 11:16 PM  ? Specimen: Nasopharyngeal Swab; Nasopharyngeal(NP) swabs in vial transport medium  ?Result Value Ref Range Status  ? SARS Coronavirus 2 by RT PCR NEGATIVE NEGATIVE Final  ?  Comment: (NOTE) ?SARS-CoV-2 target nucleic acids are NOT DETECTED. ? ?The SARS-CoV-2 RNA is generally detectable in upper respiratory ?specimens during the acute phase of infection. The lowest ?concentration of SARS-CoV-2 viral copies this assay can detect is ?138 copies/mL. A negative result does not preclude SARS-Cov-2 ?infection and should not be used as the sole basis for treatment or ?other patient management decisions. A negative result may occur with  ?improper specimen collection/handling, submission of specimen other ?than nasopharyngeal swab, presence of viral mutation(s) within the ?areas targeted by this assay, and inadequate number of viral ?copies(<138 copies/mL). A negative result must be combined with ?clinical observations, patient history, and epidemiological ?information. The expected result is Negative. ? ?Fact Sheet for Patients:  ?EntrepreneurPulse.com.au ? ?Fact Sheet for Healthcare Providers:  ?IncredibleEmployment.be ? ?This test is no t yet approved or cleared by the [place] FDA and  ?has been authorized for detection and/or diagnosis of SARS-CoV-2 by ?FDA under an  Emergency Use Authorization (EUA). This EUA will remain  ?in effect (meaning this test can be used) for the duration of the ?COVID-19 declaration under Section 564(b)(1) of the Act, 21 ?U.S.C.section 360bbb-3(b)(1

## 2021-07-07 NOTE — H&P (View-Only) (Signed)
?PROGRESS NOTE ? ? ? ?Margaret Owen  MVE:[id number] DOB: 1975/12/19 DOA: 07/05/2021 ?PCP: Lindell Spar, MD ? ? ?Brief Narrative:  ?Very pleasant 46 year old African-American female with a history of hypertension, allergic rhinitis, remote history of migraine headaches presents to the ER today with sudden onset of left upper extremity numbness, weakness, left hand paresthesias, some mild slurred speech and some facial twitching around 10 PM.  Patient states that she was at the grocery store around 6 PM.  She states that she felt kind of hungry had a mild headache.  She states that she felt hungry.  She opened some beef jerky and started eating that.  She went home and put away the groceries.  She had dinner.  She had [place] food.  She states that around 10 PM, she noticed herself feeling quite odd.  She went to the bathroom.  She is also with left facial twitching.  She tried to talk to her husband but was unable to get any words out.  She states that she felt weakness and numbness in her left hand and her arm.  She states that she tried to hold her iPhone in her left hand and dropped it.  She also noticed some clumsiness in her left leg.  She came to the ED for evaluation. ?  ?Patient denies any nausea, vomiting.  No chest pain. ?  ?On arrival temp 98.2 heart rate 100 blood pressure 139/87 sats 100% on room air. ?  ?Currently patient's symptoms are resolved. ?  ?Lab work  ?White count 9.4, hemoglobin 8.0, platelets 329 ?  ?Sodium 13 6, potassium 3.4, BUN of 8, creatinine 0.8 ?  ?COVID and flu test are both negative. ?  ?Code stroke was called.  Patient seen by neurologist. ?  ?CT head was negative for any acute intracranial abnormality. ?  ?[facility] hospitalist contacted for admission.  ? ?Assessment & Plan: ?  ?Principal Problem: ?  TIA (transient ischemic attack) ?Active Problems: ?  Essential hypertension ?  Anemia ?  Hx of migraines ?  Allergic rhinitis ? ?Acute ischemic stroke: MRI confirms small cluster of  acute infarction in the precentral right middle frontal gyrus, right MCA territory.  Likely embolic.  Patient symptoms have completely resolved.  Neurology on board.  Patient on aspirin.  Transthoracic echo with normal ejection fraction.  Neurology had consulted cardiology yesterday and patient was scheduled to have TEE today however for some reason, this is rescheduled for tomorrow.   ? ?History of migraines: No recent attacks of migraine. ? ?DVT prophylaxis: enoxaparin (LOVENOX) injection 40 mg Start: 07/06/21 1000 ?SCD's Start: 07/06/21 0359 ?  Code Status: Full Code  ?Family Communication:  None present at bedside.  Plan of care discussed with patient in length and he/she verbalized understanding and agreed with it. ? ?Status is: Observation ?The patient will require care spanning > 2 midnights and should be moved to inpatient because: Needs TEE tomorrow morning. ? ? ?Estimated body mass index is 29.09 kg/m? as calculated from the following: ?  Height as of 08/28/20: '5\' 5"'$  (1.651 m). ?  Weight as of 08/28/20: 79.3 kg. ? ?  ?Nutritional Assessment: ?There is no height or weight on file to calculate BMI.Marland Kitchen ?Seen by dietician.  I agree with the assessment and plan as outlined below: ?Nutrition Status: ?  ?  ?  ? ?. ?Skin Assessment: ?I have examined the patient's skin and I agree with the wound assessment as performed by the wound care RN as outlined  below: ?  ? ?Consultants:  ?Neurology ? ?Procedures:  ?None ? ?Antimicrobials:  ?Anti-infectives (From admission, onward)  ? ? None  ? ?  ?  ? ? ?Subjective: ? ?Patient seen and examined.  She has no complaints.  She is eager to go home. ? ?Objective: ?Vitals:  ? 07/06/21 2000 07/07/21 0000 07/07/21 0400 07/07/21 0758  ?BP: 133/80 124/76 122/72 130/77  ?Pulse: 73 62 68 (!) 58  ?Resp: '20 20 20 18  '$ ?Temp: 98.3 ?F (36.8 ?C) 98.4 ?F (36.9 ?C) 98.2 ?F (36.8 ?C) 99 ?F (37.2 ?C)  ?TempSrc: Oral Oral Oral Oral  ?SpO2: 98% 100% 99% 100%  ? ? ?Intake/Output Summary (Last 24 hours)  at 07/07/2021 1150 ?Last data filed at 07/07/2021 1017 ?Gross per 24 hour  ?Intake 466.82 ml  ?Output --  ?Net 466.82 ml  ? ?There were no vitals filed for this visit. ? ?Examination: ? ?General exam: Appears calm and comfortable  ?Respiratory system: Clear to auscultation. Respiratory effort normal. ?Cardiovascular system: S1 & S2 heard, RRR. No JVD, murmurs, rubs, gallops or clicks. No pedal edema. ?Gastrointestinal system: Abdomen is nondistended, soft and nontender. No organomegaly or masses felt. Normal bowel sounds heard. ?Central nervous system: Alert and oriented. No focal neurological deficits. ?Extremities: Symmetric 5 x 5 power. ?Skin: No rashes, lesions or ulcers.  ?Psychiatry: Judgement and insight appear normal. Mood & affect appropriate.  ? ?Data Reviewed: I have personally reviewed following labs and imaging studies ? ?CBC: ?Recent Labs  ?Lab 07/05/21 ?2327 07/05/21 ?2328 07/07/21 ?0820  ?WBC 9.4  --  6.8  ?NEUTROABS 5.9  --  3.8  ?HGB 8.0* 9.2* 8.2*  ?HCT 27.9* 27.0* 27.6*  ?MCV 70.6*  --  68.0*  ?PLT 329  --  466*  ? ? ?Basic Metabolic Panel: ?Recent Labs  ?Lab 07/05/21 ?2327 07/05/21 ?2328 07/07/21 ?0820  ?NA 136 138 136  ?K 3.4* 3.4* 3.6  ?CL 104 103 105  ?CO2 21*  --  23  ?GLUCOSE 97 96 91  ?BUN 8 10 5*  ?CREATININE 0.87 0.90 0.66  ?CALCIUM 8.4*  --  8.6*  ?MG  --   --  1.9  ? ? ?GFR: ?CrCl cannot be calculated (Unknown ideal weight.). ?Liver Function Tests: ?Recent Labs  ?Lab 07/05/21 ?2327  ?AST 23  ?ALT 9  ?ALKPHOS 45  ?BILITOT 0.4  ?PROT 6.1*  ?ALBUMIN 3.0*  ? ? ?No results for input(s): LIPASE, AMYLASE in the last 168 hours. ?No results for input(s): AMMONIA in the last 168 hours. ?Coagulation Profile: ?Recent Labs  ?Lab 07/05/21 ?2327  ?INR 1.0  ? ? ?Cardiac Enzymes: ?No results for input(s): CKTOTAL, CKMB, CKMBINDEX, TROPONINI in the last 168 hours. ?BNP (last 3 results) ?No results for input(s): PROBNP in the last 8760 hours. ?HbA1C: ?Recent Labs  ?  07/06/21 ?0623  ?HGBA1C 5.6   ? ? ?CBG: ?Recent Labs  ?Lab 07/05/21 ?2342  ?GLUCAP 101*  ? ? ?Lipid Profile: ?Recent Labs  ?  07/06/21 ?0623  ?CHOL 174  ?HDL 42  ?LDLCALC 116*  ?TRIG 79  ?CHOLHDL 4.1  ? ? ?Thyroid Function Tests: ?Recent Labs  ?  07/06/21 ?1022  ?TSH 1.356  ? ?Anemia Panel: ?Recent Labs  ?  07/06/21 ?0623 07/06/21 ?1022  ?VITAMINB12  --  113*  ?TIBC 529*  --   ?IRON 15*  --   ? ? ?Sepsis Labs: ?No results for input(s): PROCALCITON, LATICACIDVEN in the last 168 hours. ? ?Recent Results (from the past 240 hour(s))  ?Resp  Panel by RT-PCR (Flu A&B, Covid) Nasopharyngeal Swab     Status: None  ? Collection Time: 07/05/21 11:16 PM  ? Specimen: Nasopharyngeal Swab; Nasopharyngeal(NP) swabs in vial transport medium  ?Result Value Ref Range Status  ? SARS Coronavirus 2 by RT PCR NEGATIVE NEGATIVE Final  ?  Comment: (NOTE) ?SARS-CoV-2 target nucleic acids are NOT DETECTED. ? ?The SARS-CoV-2 RNA is generally detectable in upper respiratory ?specimens during the acute phase of infection. The lowest ?concentration of SARS-CoV-2 viral copies this assay can detect is ?138 copies/mL. A negative result does not preclude SARS-Cov-2 ?infection and should not be used as the sole basis for treatment or ?other patient management decisions. A negative result may occur with  ?improper specimen collection/handling, submission of specimen other ?than nasopharyngeal swab, presence of viral mutation(s) within the ?areas targeted by this assay, and inadequate number of viral ?copies(<138 copies/mL). A negative result must be combined with ?clinical observations, patient history, and epidemiological ?information. The expected result is Negative. ? ?Fact Sheet for Patients:  ?EntrepreneurPulse.com.au ? ?Fact Sheet for Healthcare Providers:  ?IncredibleEmployment.be ? ?This test is no t yet approved or cleared by the [place] FDA and  ?has been authorized for detection and/or diagnosis of SARS-CoV-2 by ?FDA under an  Emergency Use Authorization (EUA). This EUA will remain  ?in effect (meaning this test can be used) for the duration of the ?COVID-19 declaration under Section 564(b)(1) of the Act, 21 ?U.S.C.section 360bbb-3(b)(1

## 2021-07-07 NOTE — Progress Notes (Signed)
Lower extremity venous bilateral and TCD w/ bubble study completed.   Please see CV Proc for preliminary results.   Kenidy Crossland, RDMS, RVT  

## 2021-07-07 NOTE — Progress Notes (Signed)
STROKE TEAM PROGRESS NOTE  ? ?SUBJECTIVE (INTERVAL HISTORY) ?No family at bedside.  Patient seen at bedside for TCD bubble study.  No acute event overnight.  Neuro intact.  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  DVT negative.  Pending TEE tomorrow. ? ? ?OBJECTIVE ?Temp:  [98.2 ?F (36.8 ?C)-99 ?F (37.2 ?C)] 99 ?F (37.2 ?C) (03/20 2563) ?Pulse Rate:  [58-73] 58 (03/20 0758) ?Cardiac Rhythm: Normal sinus rhythm (03/20 0708) ?Resp:  [18-20] 18 (03/20 0758) ?BP: (122-133)/(72-80) 130/77 (03/20 0758) ?SpO2:  [98 %-100 %] 100 % (03/20 0758) ? ?Recent Labs  ?Lab 07/05/21 ?2342  ?GLUCAP 101*  ? ?Recent Labs  ?Lab 07/05/21 ?2327 07/05/21 ?2328 07/07/21 ?0820  ?NA 136 138 136  ?K 3.4* 3.4* 3.6  ?CL 104 103 105  ?CO2 21*  --  23  ?GLUCOSE 97 96 91  ?BUN 8 10 5*  ?CREATININE 0.87 0.90 0.66  ?CALCIUM 8.4*  --  8.6*  ?MG  --   --  1.9  ? ?Recent Labs  ?Lab 07/05/21 ?2327  ?AST 23  ?ALT 9  ?ALKPHOS 45  ?BILITOT 0.4  ?PROT 6.1*  ?ALBUMIN 3.0*  ? ?Recent Labs  ?Lab 07/05/21 ?2327 07/05/21 ?2328 07/07/21 ?0820  ?WBC 9.4  --  6.8  ?NEUTROABS 5.9  --  3.8  ?HGB 8.0* 9.2* 8.2*  ?HCT 27.9* 27.0* 27.6*  ?MCV 70.6*  --  68.0*  ?PLT 329  --  466*  ? ?No results for input(s): CKTOTAL, CKMB, CKMBINDEX, TROPONINI in the last 168 hours. ?Recent Labs  ?  07/05/21 ?2327  ?LABPROT 13.6  ?INR 1.0  ? ?Recent Labs  ?  07/06/21 ?0344  ?COLORURINE STRAW*  ?LABSPEC 1.015  ?PHURINE 8.0  ?GLUCOSEU NEGATIVE  ?HGBUR MODERATE*  ?BILIRUBINUR NEGATIVE  ?KETONESUR NEGATIVE  ?PROTEINUR NEGATIVE  ?NITRITE NEGATIVE  ?LEUKOCYTESUR NEGATIVE  ?  ?   ?Component Value Date/Time  ? CHOL 174 07/06/2021 0623  ? TRIG 79 07/06/2021 0623  ? HDL 42 07/06/2021 0623  ? CHOLHDL 4.1 07/06/2021 0623  ? VLDL 16 07/06/2021 0623  ? [place] 116 (H) 07/06/2021 8937  ? [place] 115 (H) 11/14/2019 3428  ? ?Lab Results  ?Component Value Date  ? HGBA1C 5.6 07/06/2021  ? ?   ?Component Value Date/Time  ? [place] DETECTED 07/06/2021 0344  ? [place] DETECTED  07/06/2021 0344  ? [place] DETECTED 07/06/2021 0344  ? AMPHETMU NONE DETECTED 07/06/2021 0344  ? [place] DETECTED 07/06/2021 0344  ? [place] DETECTED 07/06/2021 0344  ?  ?Recent Labs  ?Lab 07/05/21 ?2327  ?ETH <10  ? ? ?I have personally reviewed the radiological images below and agree with the radiology interpretations. ? ?CT ANGIO HEAD NECK W WO CM ? ?Result Date: 07/06/2021 ?CLINICAL DATA:  46 year old female with headache since 1800 hours. 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 EXAM: CT ANGIOGRAPHY HEAD AND NECK TECHNIQUE: Multidetector CT imaging of the head and neck was performed using the standard protocol during bolus administration of intravenous contrast. Multiplanar CT image reconstructions and MIPs were obtained to evaluate the vascular anatomy. Carotid stenosis measurements (when applicable) are obtained utilizing NASCET criteria, using the distal internal carotid diameter as the denominator. RADIATION DOSE REDUCTION: This exam was performed according to the departmental dose-optimization program which includes automated exposure control, adjustment of the mA and/or kV according to patient size and/or use of iterative reconstruction technique. CONTRAST:  38m  OMNIPAQUE IOHEXOL 350 MG/ML SOLN COMPARISON:  Brain MRI and intracranial MRA 0506 hours today. FINDINGS: CT HEAD Brain: The small acute right superior MCA territory infarcts are occult by CT. No midline shift, ventriculomegaly, mass effect, evidence of mass lesion, or acute intracranial hemorrhage. Calvarium and skull base: No acute osseous abnormality identified. Paranasal sinuses: Visualized paranasal sinuses and mastoids are stable and well aerated. Orbits: Visualized orbits and scalp soft tissues are within normal limits. CTA NECK Skeleton: Poor dentition. No acute osseous abnormality identified. Spina bifida occulta at C7 (normal variant). Upper  chest: Mild upper lung atelectasis. Negative visible superior mediastinum. Other neck: No acute neck soft tissue finding. Aortic arch: 3 vessel arch configuration. No arch atherosclerosis. Right carotid system: Mild motion or pulsation artifact at the thoracic inlet. Partially retropharyngeal course of the right carotid including a retropharyngeal carotid bifurcation. No atherosclerosis or stenosis. Left carotid system: Negative. Vertebral arteries: Negative. Codominant cervical vertebral arteries. CTA HEAD Posterior circulation: Codominant distal vertebral arteries without plaque or stenosis. Normal PICA origins and vertebrobasilar junction. Patent basilar artery without stenosis. Normal SCA and PCA origins. Posterior communicating arteries are diminutive or absent. Bilateral PCA branches are within normal limits. Anterior circulation: Both ICA siphons are patent without plaque or stenosis. Patent carotid termini. Normal MCA and ACA origins. Negative anterior communicating artery. Bilateral ACA branches are within normal limits. Left MCA M1 segment and bifurcation are patent without stenosis. Right MCA M1 segment and bifurcation are patent without stenosis. Bilateral MCA branches are within normal limits. Venous sinuses: Patent. Anatomic variants: None. Review of the MIP images confirms the above findings IMPRESSION: 1. Negative CTA head and neck.  No atherosclerosis or stenosis. 2. Known small acute right MCA infarcts are occult by CT. No new intracranial abnormality. 3. Poor dentition. Electronically Signed   By: Genevie Ann M.D.   On: 07/06/2021 08:58  ? ?MR ANGIO HEAD WO CONTRAST ? ?Result Date: 07/06/2021 ?CLINICAL DATA:  46 year old female code stroke presentation. Headache, left side numbness and slurred speech. Some symptoms now resolved. EXAM: MRI HEAD WITHOUT CONTRAST MRA HEAD WITHOUT CONTRAST TECHNIQUE: Multiplanar, multi-echo pulse sequences of the brain and surrounding structures were acquired without  intravenous contrast. Angiographic images of the Circle of Willis were acquired using MRA technique without intravenous contrast. COMPARISON:  Head CT 07/05/2021. FINDINGS: MRI HEAD FINDINGS Brain: There are roughly 4 small foci of abnormal diffusion in the right middle frontal gyrus, precentral region, affecting cortex and subcortical white matter on series 5, images 84-87. These are restricted on ADC. Subtle if any associated T2 and FLAIR hyperintensity. No hemorrhage or mass effect. No other restricted diffusion. No midline shift, mass effect, evidence of mass lesion, ventriculomegaly, extra-axial collection or acute intracranial hemorrhage. Cervicomedullary junction and pituitary are within normal limits. Normal cerebral volume. There are small but mildly to moderately advanced for age scattered bilateral subcortical white matter T2 and FLAIR hyperintense foci in the frontal lobes on series 11, image 16. No cortical encephalomalacia or chronic cerebral blood products identified. Negative deep gray nuclei, brainstem and cerebellum. Vascular: Major intracranial vascular flow voids are preserved. Skull and upper cervical spine: Nonspecific decreased T1 marrow signal in the visible spine. Skull bone marrow signal is within normal limits. Sinuses/Orbits: Negative orbits. Mild ethmoid sinus mucosal thickening. Other: Mastoids are clear. Visible internal auditory structures appear normal. Negative visible scalp and face. MRA HEAD FINDINGS Anterior circulation: Antegrade flow in both ICA siphons appears symmetric. There is some susceptibility artifact related to hyper pneumatized sphenoid sinus and  central skull base. No convincing siphon stenosis. Symmetric antegrade flow at the carotid termini, MCA and ACA origins. Anterior communicating artery and visible ACA branches are within normal limits. Left MCA M1, bifurcation, and visible left MCA branches are within normal limits. Right MCA M1 segment and bifurcation are  patent without stenosis. Visible right MCA branches are within normal limits, no branch occlusion identified. Posterior circulation: Antegrade flow in the posterior circulation with no distal vertebral or vertebrobasilar

## 2021-07-08 ENCOUNTER — Encounter (HOSPITAL_COMMUNITY)
Admission: EM | Disposition: A | Discharge status: Home / Self Care | Payer: Self-pay | Source: Home / Self Care | Attending: Emergency Medicine

## 2021-07-08 ENCOUNTER — Encounter (HOSPITAL_COMMUNITY): Payer: Self-pay | Admitting: Internal Medicine

## 2021-07-08 ENCOUNTER — Observation Stay (HOSPITAL_BASED_OUTPATIENT_CLINIC_OR_DEPARTMENT_OTHER): Payer: BC Managed Care – PPO

## 2021-07-08 DIAGNOSIS — I340 Nonrheumatic mitral (valve) insufficiency: Secondary | ICD-10-CM | POA: Diagnosis not present

## 2021-07-08 DIAGNOSIS — I10 Essential (primary) hypertension: Secondary | ICD-10-CM | POA: Diagnosis not present

## 2021-07-08 DIAGNOSIS — Q2112 Patent foramen ovale: Secondary | ICD-10-CM

## 2021-07-08 DIAGNOSIS — I639 Cerebral infarction, unspecified: Secondary | ICD-10-CM

## 2021-07-08 DIAGNOSIS — G459 Transient cerebral ischemic attack, unspecified: Secondary | ICD-10-CM | POA: Diagnosis not present

## 2021-07-08 DIAGNOSIS — Z8669 Personal history of other diseases of the nervous system and sense organs: Secondary | ICD-10-CM | POA: Diagnosis not present

## 2021-07-08 HISTORY — PX: TEE WITHOUT CARDIOVERSION: SHX5443

## 2021-07-08 LAB — PROTEIN C, TOTAL: Protein C, Total: 90 % (ref 60–150)

## 2021-07-08 SURGERY — ECHOCARDIOGRAM, TRANSESOPHAGEAL
Anesthesia: Moderate Sedation

## 2021-07-08 MED ORDER — MIDAZOLAM HCL 2 MG/2ML IJ SOLN
1.0000 mg | Freq: Once | INTRAMUSCULAR | Status: AC
  Administered 2021-07-08: 1 mg via INTRAVENOUS

## 2021-07-08 MED ORDER — FENTANYL CITRATE (PF) 100 MCG/2ML IJ SOLN
12.5000 ug | Freq: Once | INTRAMUSCULAR | Status: AC
  Administered 2021-07-08: 12.5 ug via INTRAVENOUS

## 2021-07-08 MED ORDER — ASPIRIN 81 MG PO TBEC: 81.0000 mg | DELAYED_RELEASE_TABLET | Freq: Every day | ORAL | 1 refills | Status: AC

## 2021-07-08 MED ORDER — BUTAMBEN-TETRACAINE-BENZOCAINE 2-2-14 % EX AERO
1.0000 | INHALATION_SPRAY | Freq: Once | CUTANEOUS | Status: AC
  Administered 2021-07-08: 1 via TOPICAL

## 2021-07-08 MED ORDER — ROSUVASTATIN CALCIUM 20 MG PO TABS: 20.0000 mg | ORAL_TABLET | Freq: Every day | ORAL | 0 refills | Status: DC

## 2021-07-08 MED ORDER — MIDAZOLAM HCL 2 MG/2ML IJ SOLN
INTRAMUSCULAR | Status: AC
  Filled 2021-07-08: qty 6

## 2021-07-08 MED ORDER — MIDAZOLAM HCL 2 MG/2ML IJ SOLN
1.0000 mg | Freq: Once | INTRAMUSCULAR | Status: DC
  Administered 2021-07-08: 1 mg via INTRAVENOUS

## 2021-07-08 MED ORDER — FENTANYL CITRATE (PF) 100 MCG/2ML IJ SOLN
25.0000 ug | Freq: Once | INTRAMUSCULAR | Status: AC
  Administered 2021-07-08: 25 ug via INTRAVENOUS

## 2021-07-08 MED ORDER — FENTANYL CITRATE (PF) 100 MCG/2ML IJ SOLN
INTRAMUSCULAR | Status: AC
  Filled 2021-07-08: qty 4

## 2021-07-08 MED ORDER — MIDAZOLAM HCL 2 MG/2ML IJ SOLN
1.0000 mg | Freq: Once | INTRAMUSCULAR | Status: DC
Start: 2021-07-08 — End: 2021-07-08
  Administered 2021-07-08: 1 mg via INTRAVENOUS

## 2021-07-08 MED ORDER — CLOPIDOGREL BISULFATE 75 MG PO TABS: 75.0000 mg | ORAL_TABLET | Freq: Every day | ORAL | 0 refills | Status: DC

## 2021-07-08 NOTE — Progress Notes (Addendum)
Pt brought to [place] [place] [facility], For TEE ? ?Echo tech Otila Kluver) in room: 0930 ? ?Pt In Room: 0940 ? ?Dr In Room: 1001 ? ?Timeout Completed: 1010 ? ?Scope In: 1032 ? ?Bubble study completed X2:  1046 and 1049 ? ?Scope Out: 6195 ? ?Dr Out of Room: 1102 ? ?Pt Out of Room: 1144 ? ? 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 ? ? ?

## 2021-07-08 NOTE — Progress Notes (Signed)
?  Echocardiogram ?Echocardiogram Transesophageal has been performed. ? ?Margaret Owen ?07/08/2021, 11:14 AM ?

## 2021-07-08 NOTE — Interval H&P Note (Signed)
History and Physical Interval Note: ? ?07/08/2021 ?10:07 AM ? ?Margaret Owen  has presented today for surgery, with the diagnosis of STROKE.  The various methods of treatment have been discussed with the patient and family. After consideration of risks, benefits and other options for treatment, the patient has consented to  Procedure(s) with comments: ?TRANSESOPHAGEAL ECHOCARDIOGRAM (TEE) (N/A) - being done in cath lab holding as a surgical intervention.   We have additionally reviewed the risks and benefits of moderate sedation; she notes no prior history of anesthesia issues has had three C Sections and 3 cervical cerclages. The patient's history has been reviewed, patient examined, no change in status, stable for surgery.  I have reviewed the patient's chart and labs.  Questions were answered to the patient's satisfaction.   ? ? ?Consetta Cosner A Keaghan Staton ? ? ?

## 2021-07-08 NOTE — CV Procedure (Signed)
? ? ?  TRANSESOPHAGEAL ECHOCARDIOGRAM  ? ?NAME:  Margaret Owen    ?MRN: [id number] ?DOB:  [birth year]    ?ADMIT DATE: 07/05/2021 ? ?INDICATIONS: ?Positive bubble study ? ?PROCEDURE:  ? ?Informed consent was obtained prior to the procedure. The risks, benefits and alternatives for the procedure were discussed and the patient comprehended these risks.  Risks include, but are not limited to, cough, sore throat, vomiting, nausea, somnolence, esophageal and stomach trauma or perforation, bleeding, low blood pressure, aspiration, pneumonia, infection, trauma to the teeth and death.   ? ?Procedural time out performed. The oropharynx was anesthetized with topical 1% benzocaine.   ? ?Anesthesia was administered by our team.  The patient was administered a total of Versed 5 mg, Fentanyl 100 mcg,  to achieve and maintain moderate  sedation.  The patient's heart rate, blood pressure, and oxygen saturation are monitored continuously during the procedure. The period of conscious sedation is 45 minutes, of which I was present face-to-face 100% of this time.  ? ?The transesophageal probe was inserted in the esophagus and stomach without difficulty and multiple views were obtained.  ? ?COMPLICATIONS:   ? ?There were no immediate complications. ? ?KEY FINDINGS: ? ?Small left to right shunt; positive bubble study .  ?Full report to follow. ?Further management per primary team.  ? ?Rudean Haskell, MD ?[place]  ?11:05 AM ? ? ?

## 2021-07-08 NOTE — Progress Notes (Signed)
?PROGRESS NOTE ? ? ? ?Margaret Owen  OZD:[id number] DOB: [birth year] DOA: 07/05/2021 ?PCP: Lindell Spar, MD ? ? ?Brief Narrative:  ?Very pleasant 46 year old African-American female with a history of hypertension, allergic rhinitis, remote history of migraine headaches presented  with sudden onset of left upper extremity numbness, weakness, left hand paresthesias, some mild slurred speech and some facial twitching.  No other complaints.  Upon arrival to ED, she was fairly hemodynamically stable.  COVID and flu test are both negative. ? Code stroke was called.  Patient seen by neurologist. CT head was negative for any acute intracranial abnormality.  However follow-up MRI confirmed stroke.  Details as below. ? ?Assessment & Plan: ?  ?Principal Problem: ?  TIA (transient ischemic attack) ?Active Problems: ?  Essential hypertension ?  Anemia ?  Hx of migraines ?  Allergic rhinitis ? ?Acute ischemic stroke: MRI confirms small cluster of acute infarction in the precentral right middle frontal gyrus, right MCA territory.  Likely embolic.  Patient symptoms have completely resolved.  Neurology on board.  Patient on aspirin.  Transthoracic echo with normal ejection fraction.  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  Cardiology has been consulted for closure.  Appreciate cardiology and neurology help.  She remains on a statin.  Plavix also added today.  Blood pressure controlled. ? ?History of migraines: No recent attacks of migraine. ? ?DVT prophylaxis: enoxaparin (LOVENOX) injection 40 mg Start: 07/06/21 1000 ?SCD's Start: 07/06/21 0359 ?  Code Status: Full Code  ?Family Communication:  None present at bedside.  Plan of care discussed with patient in length and he/she verbalized understanding and agreed with it. ? ?Status is: Observation ?The patient will require care spanning > 2 midnights and should be moved to inpatient because: Needs shunt closure. ? ? ?Estimated body mass  index is 29.09 kg/m? as calculated from the following: ?  Height as of 08/28/20: '5\' 5"'$  (1.651 m). ?  Weight as of 08/28/20: 79.3 kg. ? ?  ?Nutritional Assessment: ?There is no height or weight on file to calculate BMI.Marland Kitchen ?Seen by dietician.  I agree with the assessment and plan as outlined below: ?Nutrition Status: ?  ?  ?  ? ?. ?Skin Assessment: ?I have examined the patient's skin and I agree with the wound assessment as performed by the wound care RN as outlined below: ?  ? ?Consultants:  ?Neurology ? ?Procedures:  ?None ? ?Antimicrobials:  ?Anti-infectives (From admission, onward)  ? ? None  ? ?  ?  ? ? ?Subjective: ? ?Seen and examined.  No complaints. ?Objective: ?Vitals:  ? 07/08/21 1120 07/08/21 1125 07/08/21 1130 07/08/21 1140  ?BP: 119/68 114/69 116/61 119/64  ?Pulse: 68 64 65 64  ?Resp: '16 14 14 14  '$ ?Temp:      ?TempSrc:      ?SpO2: 99% 100% 99% 100%  ? ?No intake or output data in the 24 hours ending 07/08/21 1226 ? ?There were no vitals filed for this visit. ? ?Examination: ? ?General exam: Appears calm and comfortable  ?Respiratory system: Clear to auscultation. Respiratory effort normal. ?Cardiovascular system: S1 & S2 heard, RRR. No JVD, murmurs, rubs, gallops or clicks. No pedal edema. ?Gastrointestinal system: Abdomen is nondistended, soft and nontender. No organomegaly or masses felt. Normal bowel sounds heard. ?Central nervous system: Alert and oriented. No focal neurological deficits. ?Extremities: Symmetric 5 x 5 power. ?Skin: No rashes, lesions or ulcers.  ?Psychiatry: Judgement and insight appear normal. Mood & affect  appropriate.  ? ?Data Reviewed: I have personally reviewed following labs and imaging studies ? ?CBC: ?Recent Labs  ?Lab 07/05/21 ?2327 07/05/21 ?2328 07/07/21 ?0820  ?WBC 9.4  --  6.8  ?NEUTROABS 5.9  --  3.8  ?HGB 8.0* 9.2* 8.2*  ?HCT 27.9* 27.0* 27.6*  ?MCV 70.6*  --  68.0*  ?PLT 329  --  466*  ? ? ?Basic Metabolic Panel: ?Recent Labs  ?Lab 07/05/21 ?2327 07/05/21 ?2328  07/07/21 ?0820  ?NA 136 138 136  ?K 3.4* 3.4* 3.6  ?CL 104 103 105  ?CO2 21*  --  23  ?GLUCOSE 97 96 91  ?BUN 8 10 5*  ?CREATININE 0.87 0.90 0.66  ?CALCIUM 8.4*  --  8.6*  ?MG  --   --  1.9  ? ? ?GFR: ?CrCl cannot be calculated (Unknown ideal weight.). ?Liver Function Tests: ?Recent Labs  ?Lab 07/05/21 ?2327  ?AST 23  ?ALT 9  ?ALKPHOS 45  ?BILITOT 0.4  ?PROT 6.1*  ?ALBUMIN 3.0*  ? ? ?No results for input(s): LIPASE, AMYLASE in the last 168 hours. ?No results for input(s): AMMONIA in the last 168 hours. ?Coagulation Profile: ?Recent Labs  ?Lab 07/05/21 ?2327  ?INR 1.0  ? ? ?Cardiac Enzymes: ?No results for input(s): CKTOTAL, CKMB, CKMBINDEX, TROPONINI in the last 168 hours. ?BNP (last 3 results) ?No results for input(s): PROBNP in the last 8760 hours. ?HbA1C: ?Recent Labs  ?  07/06/21 ?0623  ?HGBA1C 5.6  ? ? ?CBG: ?Recent Labs  ?Lab 07/05/21 ?2342  ?GLUCAP 101*  ? ? ?Lipid Profile: ?Recent Labs  ?  07/06/21 ?0623  ?CHOL 174  ?HDL 42  ?LDLCALC 116*  ?TRIG 79  ?CHOLHDL 4.1  ? ? ?Thyroid Function Tests: ?Recent Labs  ?  07/06/21 ?1022  ?TSH 1.356  ? ? ?Anemia Panel: ?Recent Labs  ?  07/06/21 ?0623 07/06/21 ?1022  ?VITAMINB12  --  113*  ?TIBC 529*  --   ?IRON 15*  --   ? ? ?Sepsis Labs: ?No results for input(s): PROCALCITON, LATICACIDVEN in the last 168 hours. ? ?Recent Results (from the past 240 hour(s))  ?Resp Panel by RT-PCR (Flu A&B, Covid) Nasopharyngeal Swab     Status: None  ? Collection Time: 07/05/21 11:16 PM  ? Specimen: Nasopharyngeal Swab; Nasopharyngeal(NP) swabs in vial transport medium  ?Result Value Ref Range Status  ? SARS Coronavirus 2 by RT PCR NEGATIVE NEGATIVE Final  ?  Comment: (NOTE) ?SARS-CoV-2 target nucleic acids are NOT DETECTED. ? ?The SARS-CoV-2 RNA is generally detectable in upper respiratory ?specimens during the acute phase of infection. The lowest ?concentration of SARS-CoV-2 viral copies this assay can detect is ?138 copies/mL. A negative result does not preclude SARS-Cov-2 ?infection  and should not be used as the sole basis for treatment or ?other patient management decisions. A negative result may occur with  ?improper specimen collection/handling, submission of specimen other ?than nasopharyngeal swab, presence of viral mutation(s) within the ?areas targeted by this assay, and inadequate number of viral ?copies(<138 copies/mL). A negative result must be combined with ?clinical observations, patient history, and epidemiological ?information. The expected result is Negative. ? ?Fact Sheet for Patients:  ?EntrepreneurPulse.com.au ? ?Fact Sheet for Healthcare Providers:  ?IncredibleEmployment.be ? ?This test is no t yet approved or cleared by the [place] FDA and  ?has been authorized for detection and/or diagnosis of SARS-CoV-2 by ?FDA under an Emergency Use Authorization (EUA). This EUA will remain  ?in effect (meaning this test can be used) for the duration  of the ?COVID-19 declaration under Section 564(b)(1) of the Act, 21 ?U.S.C.section 360bbb-3(b)(1), unless the authorization is terminated  ?or revoked sooner.  ? ? ?  ? Influenza A by PCR NEGATIVE NEGATIVE Final  ? Influenza B by PCR NEGATIVE NEGATIVE Final  ?  Comment: (NOTE) ?The Xpert Xpress SARS-CoV-2/FLU/RSV plus assay is intended as an aid ?in the diagnosis of influenza from Nasopharyngeal swab specimens and ?should not be used as a sole basis for treatment. Nasal washings and ?aspirates are unacceptable for Xpert Xpress SARS-CoV-2/FLU/RSV ?testing. ? ?Fact Sheet for Patients: ?EntrepreneurPulse.com.au ? ?Fact Sheet for Healthcare Providers: ?IncredibleEmployment.be ? ?This test is not yet approved or cleared by the [place] FDA and ?has been authorized for detection and/or diagnosis of SARS-CoV-2 by ?FDA under an Emergency Use Authorization (EUA). This EUA will remain ?in effect (meaning this test can be used) for the duration of the ?COVID-19 declaration  under Section 564(b)(1) of the Act, 21 U.S.C. ?section 360bbb-3(b)(1), unless the authorization is terminated or ?revoked. ? ?Performed at [facility], [place] [address] [place], [place] ?[address] ?  ? ?

## 2021-07-08 NOTE — Progress Notes (Signed)
STROKE TEAM PROGRESS NOTE  ? ?SUBJECTIVE (INTERVAL HISTORY) ?Pt uncle at the bedside. Pt lying in bed, no complains. 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 Will refer her to Dr. Burt Knack to consider PFO closure.  ? ?OBJECTIVE ?Temp:  [97.9 ?F (36.6 ?C)-98.8 ?F (37.1 ?C)] 98.8 ?F (37.1 ?C) (03/21 2778) ?Pulse Rate:  [58-84] 68 (03/21 1105) ?Cardiac Rhythm: Sinus tachycardia (03/21 0720) ?Resp:  [12-21] 13 (03/21 1105) ?BP: (115-146)/(64-89) 122/66 (03/21 1105) ?SpO2:  [100 %] 100 % (03/21 1105) ? ?Recent Labs  ?Lab 07/05/21 ?2342  ?GLUCAP 101*  ? ?Recent Labs  ?Lab 07/05/21 ?2327 07/05/21 ?2328 07/07/21 ?0820  ?NA 136 138 136  ?K 3.4* 3.4* 3.6  ?CL 104 103 105  ?CO2 21*  --  23  ?GLUCOSE 97 96 91  ?BUN 8 10 5*  ?CREATININE 0.87 0.90 0.66  ?CALCIUM 8.4*  --  8.6*  ?MG  --   --  1.9  ? ?Recent Labs  ?Lab 07/05/21 ?2327  ?AST 23  ?ALT 9  ?ALKPHOS 45  ?BILITOT 0.4  ?PROT 6.1*  ?ALBUMIN 3.0*  ? ?Recent Labs  ?Lab 07/05/21 ?2327 07/05/21 ?2328 07/07/21 ?0820  ?WBC 9.4  --  6.8  ?NEUTROABS 5.9  --  3.8  ?HGB 8.0* 9.2* 8.2*  ?HCT 27.9* 27.0* 27.6*  ?MCV 70.6*  --  68.0*  ?PLT 329  --  466*  ? ?No results for input(s): CKTOTAL, CKMB, CKMBINDEX, TROPONINI in the last 168 hours. ?Recent Labs  ?  07/05/21 ?2327  ?LABPROT 13.6  ?INR 1.0  ? ?Recent Labs  ?  07/06/21 ?0344  ?COLORURINE STRAW*  ?LABSPEC 1.015  ?PHURINE 8.0  ?GLUCOSEU NEGATIVE  ?HGBUR MODERATE*  ?BILIRUBINUR NEGATIVE  ?KETONESUR NEGATIVE  ?PROTEINUR NEGATIVE  ?NITRITE NEGATIVE  ?LEUKOCYTESUR NEGATIVE  ?  ?   ?Component Value Date/Time  ? CHOL 174 07/06/2021 0623  ? TRIG 79 07/06/2021 0623  ? HDL 42 07/06/2021 0623  ? CHOLHDL 4.1 07/06/2021 0623  ? VLDL 16 07/06/2021 0623  ? [place] 116 (H) 07/06/2021 2423  ? [place] 115 (H) 11/14/2019 5361  ? ?Lab Results  ?Component Value Date  ? HGBA1C 5.6 07/06/2021  ? ?   ?Component Value Date/Time  ? [place] DETECTED 07/06/2021 0344  ? [place] DETECTED 07/06/2021 0344  ? [place] DETECTED 07/06/2021 0344  ?  [facility] NONE DETECTED 07/06/2021 0344  ? [place] DETECTED 07/06/2021 0344  ? [place] DETECTED 07/06/2021 0344  ?  ?Recent Labs  ?Lab 07/05/21 ?2327  ?ETH <10  ? ? ?I have personally reviewed the radiological images below and agree with the radiology interpretations. ? ?CT ANGIO HEAD NECK W WO CM ? ?Result Date: 07/06/2021 ?CLINICAL DATA:  46 year old female with headache since 1800 hours. 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 EXAM: CT ANGIOGRAPHY HEAD AND NECK TECHNIQUE: Multidetector CT imaging of the head and neck was performed using the standard protocol during bolus administration of intravenous contrast. Multiplanar CT image reconstructions and MIPs were obtained to evaluate the vascular anatomy. Carotid stenosis measurements (when applicable) are obtained utilizing NASCET criteria, using the distal internal carotid diameter as the denominator. RADIATION DOSE REDUCTION: This exam was performed according to the departmental dose-optimization program which includes automated exposure control, adjustment of the mA and/or kV according to patient size and/or use of iterative reconstruction technique. CONTRAST:  77m OMNIPAQUE IOHEXOL 350 MG/ML SOLN COMPARISON:  Brain MRI and intracranial  MRA 0506 hours today. FINDINGS: CT HEAD Brain: The small acute right superior MCA territory infarcts are occult by CT. No midline shift, ventriculomegaly, mass effect, evidence of mass lesion, or acute intracranial hemorrhage. Calvarium and skull base: No acute osseous abnormality identified. Paranasal sinuses: Visualized paranasal sinuses and mastoids are stable and well aerated. Orbits: Visualized orbits and scalp soft tissues are within normal limits. CTA NECK Skeleton: Poor dentition. No acute osseous abnormality identified. Spina bifida occulta at C7 (normal variant). Upper chest: Mild upper lung atelectasis. Negative visible superior  mediastinum. Other neck: No acute neck soft tissue finding. Aortic arch: 3 vessel arch configuration. No arch atherosclerosis. Right carotid system: Mild motion or pulsation artifact at the thoracic inlet. Partially retropharyngeal course of the right carotid including a retropharyngeal carotid bifurcation. No atherosclerosis or stenosis. Left carotid system: Negative. Vertebral arteries: Negative. Codominant cervical vertebral arteries. CTA HEAD Posterior circulation: Codominant distal vertebral arteries without plaque or stenosis. Normal PICA origins and vertebrobasilar junction. Patent basilar artery without stenosis. Normal SCA and PCA origins. Posterior communicating arteries are diminutive or absent. Bilateral PCA branches are within normal limits. Anterior circulation: Both ICA siphons are patent without plaque or stenosis. Patent carotid termini. Normal MCA and ACA origins. Negative anterior communicating artery. Bilateral ACA branches are within normal limits. Left MCA M1 segment and bifurcation are patent without stenosis. Right MCA M1 segment and bifurcation are patent without stenosis. Bilateral MCA branches are within normal limits. Venous sinuses: Patent. Anatomic variants: None. Review of the MIP images confirms the above findings IMPRESSION: 1. Negative CTA head and neck.  No atherosclerosis or stenosis. 2. Known small acute right MCA infarcts are occult by CT. No new intracranial abnormality. 3. Poor dentition. Electronically Signed   By: Genevie Ann M.D.   On: 07/06/2021 08:58  ? ?MR ANGIO HEAD WO CONTRAST ? ?Result Date: 07/06/2021 ?CLINICAL DATA:  46 year old female code stroke presentation. Headache, left side numbness and slurred speech. Some symptoms now resolved. EXAM: MRI HEAD WITHOUT CONTRAST MRA HEAD WITHOUT CONTRAST TECHNIQUE: Multiplanar, multi-echo pulse sequences of the brain and surrounding structures were acquired without intravenous contrast. Angiographic images of the Circle of Willis  were acquired using MRA technique without intravenous contrast. COMPARISON:  Head CT 07/05/2021. FINDINGS: MRI HEAD FINDINGS Brain: There are roughly 4 small foci of abnormal diffusion in the right middle frontal gyrus, precentral region, affecting cortex and subcortical white matter on series 5, images 84-87. These are restricted on ADC. Subtle if any associated T2 and FLAIR hyperintensity. No hemorrhage or mass effect. No other restricted diffusion. No midline shift, mass effect, evidence of mass lesion, ventriculomegaly, extra-axial collection or acute intracranial hemorrhage. Cervicomedullary junction and pituitary are within normal limits. Normal cerebral volume. There are small but mildly to moderately advanced for age scattered bilateral subcortical white matter T2 and FLAIR hyperintense foci in the frontal lobes on series 11, image 16. No cortical encephalomalacia or chronic cerebral blood products identified. Negative deep gray nuclei, brainstem and cerebellum. Vascular: Major intracranial vascular flow voids are preserved. Skull and upper cervical spine: Nonspecific decreased T1 marrow signal in the visible spine. Skull bone marrow signal is within normal limits. Sinuses/Orbits: Negative orbits. Mild ethmoid sinus mucosal thickening. Other: Mastoids are clear. Visible internal auditory structures appear normal. Negative visible scalp and face. MRA HEAD FINDINGS Anterior circulation: Antegrade flow in both ICA siphons appears symmetric. There is some susceptibility artifact related to hyper pneumatized sphenoid sinus and central skull base. No convincing siphon stenosis. Symmetric antegrade flow at  the carotid termini, MCA and ACA origins. Anterior communicating artery and visible ACA branches are within normal limits. Left MCA M1, bifurcation, and visible left MCA branches are within normal limits. Right MCA M1 segment and bifurcation are patent without stenosis. Visible right MCA branches are within  normal limits, no branch occlusion identified. Posterior circulation: Antegrade flow in the posterior circulation with no distal vertebral or vertebrobasilar junction stenosis. Mildly dominant left V4 segment. Bot

## 2021-07-08 NOTE — Plan of Care (Signed)
?  Problem: Education: ?Goal: Knowledge of disease or condition will improve ?Outcome: Progressing ?Goal: Individualized Educational Video(s) ?Outcome: Progressing ?  ?Problem: Self-Care: ?Goal: Ability to participate in self-care as condition permits will improve ?Outcome: Progressing ?  ?Problem: Nutrition: ?Goal: Risk of aspiration will decrease ?Outcome: Progressing ?  ?Problem: Ischemic Stroke/TIA Tissue Perfusion: ?Goal: Complications of ischemic stroke/TIA will be minimized ?Outcome: Progressing ?  ?Problem: Activity: ?Goal: Risk for activity intolerance will decrease ?Outcome: Progressing ?  ?Problem: Nutrition: ?Goal: Adequate nutrition will be maintained ?Outcome: Progressing ?  ?

## 2021-07-08 NOTE — TOC Transition Note (Signed)
Transition of Care (TOC) - CM/SW Discharge Note ? ? ?Patient Details  ?Name: Margaret Owen ?MRN: [id number] ?Date of Birth: [birth year] ? ?Transition of Care (TOC) CM/SW Contact:  ?Pollie Friar, RN ?Phone Number: ?07/08/2021, 3:12 PM ? ? ?Clinical Narrative:    ?Patient is discharging home with self care. No needs per PT/OT.  ?Pt has transportation home.  ? ? ?Final next level of care: Home/Self Care ?Barriers to Discharge: No Barriers Identified ? ? ?Patient Goals and CMS Choice ?  ?  ?  ? ?Discharge Placement ?  ?           ?  ?  ?  ?  ? ?Discharge Plan and Services ?  ?  ?           ?  ?  ?  ?  ?  ?  ?  ?  ?  ?  ? ?Social Determinants of Health (SDOH) Interventions ?  ? ? ?Readmission Risk Interventions ?No flowsheet data found. ? ? ? ? ?

## 2021-07-08 NOTE — Discharge Summary (Signed)
PatientPhysician Discharge Summary  ?Margaret Owen JJO:[id number] DOB: 01/13/1976 DOA: 07/05/2021 ? ?PCP: Lindell Spar, MD ? ?Admit date: 07/05/2021 ?Discharge date: 07/08/2021 ? ? ? ?Admitted From: Home ?Disposition: Home ? ?Recommendations for Outpatient Follow-up:  ?Follow up with PCP in 1-2 weeks ?Please obtain BMP/CBC in one week ?Follow-up with Dr. Burt Knack outpatient for cardiac stent closure ?Follow-up with [facility] neurology in 4 weeks ?Please follow up with your PCP on the following pending results: ?Unresulted Labs (From admission, onward)  ? ?  Start     Ordered  ? 07/06/21 0746  Protein C activity  (Hypercoagulable Panel, Comprehensive (PNL))  Once,   R       ? 07/06/21 0745  ? 07/06/21 0746  Protein C, total  (Hypercoagulable Panel, Comprehensive (PNL))  Once,   R       ? 07/06/21 0745  ? 07/06/21 0746  Protein S activity  (Hypercoagulable Panel, Comprehensive (PNL))  Once,   R       ? 07/06/21 0745  ? 07/06/21 0746  Protein S, total  (Hypercoagulable Panel, Comprehensive (PNL))  Once,   R       ? 07/06/21 0745  ? 07/06/21 0746  Lupus anticoagulant panel  (Hypercoagulable Panel, Comprehensive (PNL))  Once,   R       ? 07/06/21 0745  ? 07/06/21 0746  Beta-2-glycoprotein i abs, IgG/M/A  (Hypercoagulable Panel, Comprehensive (PNL))  Once,   R       ? 07/06/21 0745  ? 07/06/21 0746  Factor 5 leiden  (Hypercoagulable Panel, Comprehensive (PNL))  Once,   R       ? 07/06/21 0745  ? 07/06/21 0746  Prothrombin gene mutation  (Hypercoagulable Panel, Comprehensive (PNL))  Once,   R       ? 07/06/21 0745  ? 07/06/21 0746  Cardiolipin antibodies, IgG, IgM, IgA  (Hypercoagulable Panel, Comprehensive (PNL))  Once,   R       ? 07/06/21 0745  ? 07/06/21 0746  Phosphatidylserine antibodies  Once,   R       ? 07/06/21 0745  ? 07/06/21 0746  ANA, IFA (with reflex)  Once,   R       ? 07/06/21 0745  ? ?  ?  ? ?  ?  ? ? ?Home Health: None ?Equipment/Devices: None ? ?Discharge Condition: Stable ?CODE STATUS: Full code ?Diet  recommendation: Cardiac ? ?Subjective: Seen and examined.  No complaints.  Eager to go home. ? ?Brief/Interim Summary: Very pleasant 46 year old African-American female with a history of hypertension, allergic rhinitis, remote history of migraine headaches presented  with sudden onset of left upper extremity numbness, weakness, left hand paresthesias, some mild slurred speech and some facial twitching.  No other complaints.  Upon arrival to ED, she was fairly hemodynamically stable.  COVID and flu test are both negative. ? Code stroke was called.  Patient seen by neurologist. CT head was negative for any acute intracranial abnormality.  However MRI confirms small cluster of acute infarction in the precentral right middle frontal gyrus, right MCA territory.  Likely embolic.  Patient symptoms have completely resolved.  Neurology on board. Transthoracic echo with normal ejection fraction.  Patient underwent TEE which is positive for left-to-right shunt with possible PFO.  Cardiology has been consulted for closure.  She will see Dr. Burt Knack as outpatient for closure.  Neurology has cleared her for discharge and recommended discharging on DAPT, aspirin plus Plavix as well as statin.  She will  follow-up with neurology in 4 weeks.   ? ?Discharge plan was discussed with patient and/or family member and they verbalized understanding and agreed with it.  ?Discharge Diagnoses:  ?Principal Problem: ?  TIA (transient ischemic attack) ?Active Problems: ?  Essential hypertension ?  Anemia ?  Hx of migraines ?  Allergic rhinitis ? ? ? ?Discharge Instructions ? ?Discharge Instructions   ? ? Ambulatory referral to Neurology   Complete by: As directed ?  ? Follow up with stroke clinic NP (Jessica Finlayson or Cecille Rubin, if both not available, consider Zachery Dauer, or [place]) at [facility] in about 4 weeks. Thanks.  ? ?  ? ?Allergies as of 07/08/2021   ?No Known Allergies ?  ? ?  ?Medication List  ?  ? ?STOP taking these medications    ? ?fluticasone 50 MCG/ACT nasal spray ?Commonly known as: FLONASE ?  ? ?  ? ?TAKE these medications   ? ?amLODipine 10 MG tablet ?Commonly known as: NORVASC ?Take 1 tablet (10 mg total) by mouth daily. ?  ?aspirin 81 MG EC tablet ?Take 1 tablet (81 mg total) by mouth daily. Swallow whole. ?Start taking on: July 09, 2021 ?  ?clopidogrel 75 MG tablet ?Commonly known as: PLAVIX ?Take 1 tablet (75 mg total) by mouth daily. ?Start taking on: July 09, 2021 ?  ?ibuprofen 200 MG tablet ?Commonly known as: ADVIL ?Take 400 mg by mouth every 6 (six) hours as needed for headache or moderate pain. ?  ?levocetirizine 5 MG tablet ?Commonly known as: XYZAL ?TAKE 1 TABLET(5 MG) BY MOUTH EVERY EVENING ?What changed: See the new instructions. ?  ?MIDOL PO ?Take 2 tablets by mouth daily as needed (cramping). ?  ?norelgestromin-ethinyl estradiol 150-35 MCG/24HR transdermal patch ?Commonly known as: Marilu Favre ?Place 1 patch onto the skin once a week. ?What changed: when to take this ?  ?oxymetazoline 0.05 % nasal spray ?Commonly known as: AFRIN ?Place 1 spray into both nostrils daily as needed for congestion. ?  ?rosuvastatin 20 MG tablet ?Commonly known as: CRESTOR ?Take 1 tablet (20 mg total) by mouth daily. ?Start taking on: July 09, 2021 ?  ?VISINE-A OP ?Place 1 drop into both eyes daily as needed (dry eyes). ?  ? ?  ? ? Follow-up Information   ? ? [facility]. Schedule an appointment as soon as possible for a visit in 1 month(s).   ?Specialty: Neurology ?Why: stroke clinic ?Contact information: ?[address] [place] ?[phone] ? ?  ?  ? ? Lindell Spar, MD Follow up in 1 week(s).   ?Specialty: Internal Medicine ?Contact information: ?[address] ?[place] [address] ?[phone] ? ? ?  ?  ? ?  ?  ? ?  ? ?No Known Allergies ? ?Consultations: Neurology and cardiology ? ? ?Procedures/Studies: ?CT ANGIO HEAD NECK W WO CM ? ?Result Date: 07/06/2021 ?CLINICAL DATA:   46 year old female with headache since 1800 hours. 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 EXAM: CT ANGIOGRAPHY HEAD AND NECK TECHNIQUE: Multidetector CT imaging of the head and neck was performed using the standard protocol during bolus administration of intravenous contrast. Multiplanar CT image reconstructions and MIPs were obtained to evaluate the vascular anatomy. Carotid stenosis measurements (when applicable) are obtained utilizing NASCET criteria, using the distal internal carotid diameter as the denominator. RADIATION DOSE REDUCTION: This exam was performed according to the departmental dose-optimization program which  includes automated exposure control, adjustment of the mA and/or kV according to patient size and/or use of iterative reconstruction technique. CONTRAST:  14m OMNIPAQUE IOHEXOL 350 MG/ML SOLN COMPARISON:  Brain MRI and intracranial MRA 0506 hours today. FINDINGS: CT HEAD Brain: The small acute right superior MCA territory infarcts are occult by CT. No midline shift, ventriculomegaly, mass effect, evidence of mass lesion, or acute intracranial hemorrhage. Calvarium and skull base: No acute osseous abnormality identified. Paranasal sinuses: Visualized paranasal sinuses and mastoids are stable and well aerated. Orbits: Visualized orbits and scalp soft tissues are within normal limits. CTA NECK Skeleton: Poor dentition. No acute osseous abnormality identified. Spina bifida occulta at C7 (normal variant). Upper chest: Mild upper lung atelectasis. Negative visible superior mediastinum. Other neck: No acute neck soft tissue finding. Aortic arch: 3 vessel arch configuration. No arch atherosclerosis. Right carotid system: Mild motion or pulsation artifact at the thoracic inlet. Partially retropharyngeal course of the right carotid including a retropharyngeal carotid bifurcation. No atherosclerosis or stenosis. Left  carotid system: Negative. Vertebral arteries: Negative. Codominant cervical vertebral arteries. CTA HEAD Posterior circulation: Codominant distal vertebral arteries without plaque or stenosis. Normal PICA origi

## 2021-07-09 ENCOUNTER — Encounter (HOSPITAL_COMMUNITY): Payer: Self-pay | Admitting: Internal Medicine

## 2021-07-09 ENCOUNTER — Telehealth: Payer: Self-pay | Admitting: *Deleted

## 2021-07-09 LAB — CARDIOLIPIN ANTIBODIES, IGG, IGM, IGA
Anticardiolipin IgA: <9 APL U/mL (ref 0–11)
Anticardiolipin IgG: <9 GPL U/mL (ref 0–14)
Anticardiolipin IgM: <9 MPL U/mL (ref 0–12)

## 2021-07-09 LAB — LUPUS ANTICOAGULANT PANEL
DRVVT: 34.6 s (ref 0.0–47.0)
PTT Lupus Anticoagulant: 31.5 s (ref 0.0–43.5)

## 2021-07-09 LAB — PROTEIN S, TOTAL: Protein S Ag, Total: 55 % — ABNORMAL LOW (ref 60–150)

## 2021-07-09 LAB — BETA-2-GLYCOPROTEIN I ABS, IGG/M/A
Beta-2 Glyco I IgG: <9 GPI IgG units (ref 0–20)
Beta-2-Glycoprotein I IgA: <9 GPI IgA units (ref 0–25)
Beta-2-Glycoprotein I IgM: <9 GPI IgM units (ref 0–32)

## 2021-07-09 LAB — PROTEIN S ACTIVITY: Protein S Activity: 28 % — ABNORMAL LOW (ref 63–140)

## 2021-07-09 LAB — PROTEIN C ACTIVITY: Protein C Activity: 115 % (ref 73–180)

## 2021-07-09 NOTE — Telephone Encounter (Signed)
Transition Care Management Unsuccessful Follow-up Telephone Call ? ?Date of discharge and from where:  07-08-21 ? ?Attempts:  1st Attempt ? ?Reason for unsuccessful TCM follow-up call:  Left voice message ? ? ? ?

## 2021-07-10 LAB — ANTINUCLEAR ANTIBODIES, IFA: ANA Ab, IFA: NEGATIVE

## 2021-07-10 LAB — PHOSPHATIDYLSERINE ANTIBODIES
Phosphatydalserine, IgA: 1 APS Units (ref 0–19)
Phosphatydalserine, IgG: <9 Units (ref 0–30)
Phosphatydalserine, IgM: 14 Units (ref 0–30)

## 2021-07-10 LAB — PROTHROMBIN GENE MUTATION

## 2021-07-10 LAB — FACTOR 5 LEIDEN

## 2021-07-11 ENCOUNTER — Other Ambulatory Visit: Payer: Self-pay | Admitting: Neurology

## 2021-07-11 ENCOUNTER — Encounter: Payer: Self-pay | Admitting: Internal Medicine

## 2021-07-11 DIAGNOSIS — D6859 Other primary thrombophilia: Secondary | ICD-10-CM

## 2021-07-11 NOTE — Progress Notes (Signed)
Called pt and told her that her blood test while as inpatient came back showed protein S activity was low. Although it is more related to venous thrombosis and not much related to stroke, we will refer her to hematology for evaluation .  ? ?Also remind her about her appointment with Dr. Burt Knack on 3/29 and neurology appointment 4/25 and she expressed appreciation.  ? ?She stated that she has been doing well after discharge and plan to go back to work next week.  ? ?Rosalin Hawking, MD PhD ?Stroke Neurology ?07/11/2021 ?6:31 PM ? ?

## 2021-07-14 ENCOUNTER — Telehealth: Payer: Self-pay

## 2021-07-14 ENCOUNTER — Telehealth: Payer: Self-pay | Admitting: Physician Assistant

## 2021-07-14 NOTE — Telephone Encounter (Signed)
Left voicemail to call office to schedule an ER follow up. ?

## 2021-07-14 NOTE — Telephone Encounter (Signed)
Scheduled appt per 3/24 referral. Pt is aware of appt date and time. Pt is aware to arrive 15 mins prior to appt time and to bring and updated insurance card. Pt is aware of appt location.   ?

## 2021-07-14 NOTE — Telephone Encounter (Signed)
Sherren Mocha, MD  Theodoro Parma, RN ?She doesn't have a PFO. Nothing to close. I reviewed with Mahesh. Could you let her know we reviewed and agree there is nothing approachable with closure? thx  ? ? ?Per Dr. Burt Knack, informed the patient of above. She wishes to cancel scheduled appointment this week and will call back if she decides she wishes to speak with the doctor. ?She was grateful for assistance. ?

## 2021-07-16 ENCOUNTER — Institutional Professional Consult (permissible substitution): Payer: BC Managed Care – PPO | Admitting: Cardiovascular Disease

## 2021-07-17 ENCOUNTER — Ambulatory Visit: Payer: BC Managed Care – PPO | Admitting: Internal Medicine

## 2021-07-17 ENCOUNTER — Encounter: Payer: Self-pay | Admitting: Internal Medicine

## 2021-07-17 VITALS — BP 132/82 | HR 93 | Ht 64.0 in | Wt 170.4 lb

## 2021-07-17 DIAGNOSIS — Z09 Encounter for follow-up examination after completed treatment for conditions other than malignant neoplasm: Secondary | ICD-10-CM

## 2021-07-17 DIAGNOSIS — G459 Transient cerebral ischemic attack, unspecified: Secondary | ICD-10-CM | POA: Diagnosis not present

## 2021-07-17 DIAGNOSIS — E538 Deficiency of other specified B group vitamins: Secondary | ICD-10-CM | POA: Insufficient documentation

## 2021-07-17 DIAGNOSIS — D509 Iron deficiency anemia, unspecified: Secondary | ICD-10-CM

## 2021-07-17 DIAGNOSIS — I10 Essential (primary) hypertension: Secondary | ICD-10-CM | POA: Diagnosis not present

## 2021-07-17 MED ORDER — CYANOCOBALAMIN 1000 MCG/ML IJ SOLN
1000.0000 ug | Freq: Once | INTRAMUSCULAR | Status: AC
  Administered 2021-07-17: 1000 ug via INTRAMUSCULAR

## 2021-07-17 MED ORDER — CYANOCOBALAMIN 1000 MCG/ML IJ SOLN: 1000.0000 ug | Freq: Once | INTRAMUSCULAR | Status: DC

## 2021-07-17 NOTE — Assessment & Plan Note (Signed)
The patient is currently taking OTC iron supplements and c/o of constipation. ?Encouraged to eat fiber-rich foods such as apples, oranges, and grapefruits.  ? ?

## 2021-07-17 NOTE — Progress Notes (Signed)
? ?Established Patient Office Visit ? ?Subjective:  ?Patient ID: Margaret Owen, female    DOB: 1975/09/28  Age: 46 y.o. MRN: [id number] ? ?CC:  ?Chief Complaint  ?Patient presents with  ? Follow-up  ?  Pt states she had a TIA and was seen at the ED on 03/18. Has no sx today.   ? ? ?HPI ?Margaret Owen presents today for a follow-up since being discharged from the ED for TIA on 3/18. AT today's appointment, the patient denies headaches, dizziness, numbness, and weakness. The patient noted a family history of stroke and has not had any stroke-like symptoms in the past.The patient was discharged from the Ed on DAPT, aspirin plus plavix, and statin, which she has been adherent on. ? ?The patient was scheduled to follow up with Dr. Burt Knack on 3/29, but the appt was canceled because the cardiologist noted no shunt (PFO) in the heart.  She had hypercoagulable work-up done at the hospital, which showed likely protein S deficiency.  The patient has an appt with hematology on 07/24/2021. ? ?The patient received a cyanocobalamin Vit B12 injection today at the office and will return for weekly injections for four weeks. ?  ? ?Past Medical History:  ?Diagnosis Date  ? Bell palsy   ? when she was a teenager  ? Breakthrough bleeding associated with intrauterine device (IUD) 03/22/2017  ? Hypertension   ? IUD contraception 10/05/2016  ? Malpositioned IUD 04/14/2017  ? REmoved 04/14/17   ? MIGRAINES, HX OF 06/11/2008  ? Qualifier: Diagnosis of  By: Truett Mainland MD, Christine    ? Pedal edema 10/05/2016  ? ? ?Past Surgical History:  ?Procedure Laterality Date  ? CERVICAL CERCLAGE    ? CESAREAN SECTION    ? 2000,2003,2005  ? CESAREAN SECTION  08/17/1998  ? TEE WITHOUT CARDIOVERSION N/A 07/08/2021  ? Procedure: TRANSESOPHAGEAL ECHOCARDIOGRAM (TEE);  Surgeon: Werner Lean, MD;  Location: [facility] ENDOSCOPY;  Service: Cardiovascular;  Laterality: N/A;  being done in cath lab holding  ? ? ?Family History  ?Problem Relation Age of Onset  ?  Heart failure Father   ? Alcohol abuse Father   ? Heart disease Father 73  ?     died at 50  ? Hypertension Father   ? Stroke Mother   ? Hypertension Mother   ? Arthritis Mother   ? Asthma Mother   ? Diabetes Mother   ? Hyperlipidemia Mother   ? Glaucoma Mother   ? Breast cancer Maternal Aunt   ? Colon cancer Maternal Aunt   ? Stroke Maternal Aunt   ? Glaucoma Sister   ? Asthma Brother   ? ? ?Social History  ? ?Socioeconomic History  ? Marital status: Married  ?  Spouse name: Harrell Gave  ? Number of children: 3  ? Years of education: 66  ? Highest education level: Not on file  ?Occupational History  ? Occupation: Artist  ?  Comment: state of [place]  ?Tobacco Use  ? Smoking status: Never  ? Smokeless tobacco: Never  ?Vaping Use  ? Vaping Use: Never used  ?Substance and Sexual Activity  ? Alcohol use: Yes  ?  Comment: occasional  ? Drug use: No  ? Sexual activity: Yes  ?  Birth control/protection: Patch  ?Other Topics Concern  ? Not on file  ?Social History Narrative  ? Husband is a Stephanie Coup  ? Lives with husband Christopher-married 24 years  ? Three daughters: all live at home 80, 17,15   ?  Works for [employer], Art gallery manager  ?   ? Dog: Bandit   ?   ? Enjoys: gardening, flowers  ?   ? Diet: eat all food groups  ? Caffeine: 2 daily  ? Water: 2 bottles daily   ?   ? Wears seat belt   ? Does not use phone while driving   ? Smoke detectors at home   ? ?Social Determinants of Health  ? ?Financial Resource Strain: Not on file  ?Food Insecurity: Not on file  ?Transportation Needs: Not on file  ?Physical Activity: Not on file  ?Stress: Not on file  ?Social Connections: Not on file  ?Intimate Partner Violence: Not on file  ? ? ?Outpatient Medications Prior to Visit  ?Medication Sig Dispense Refill  ? Acetaminophen (MIDOL PO) Take 2 tablets by mouth daily as needed (cramping).    ? amLODipine (NORVASC) 10 MG tablet Take 1 tablet (10 mg total) by mouth daily. 90 tablet 1  ? aspirin EC 81 MG EC tablet Take 1 tablet (81  mg total) by mouth daily. Swallow whole. 30 tablet 1  ? clopidogrel (PLAVIX) 75 MG tablet Take 1 tablet (75 mg total) by mouth daily. 30 tablet 0  ? ibuprofen (ADVIL) 200 MG tablet Take 400 mg by mouth every 6 (six) hours as needed for headache or moderate pain.    ? levocetirizine (XYZAL) 5 MG tablet TAKE 1 TABLET(5 MG) BY MOUTH EVERY EVENING (Patient taking differently: Take 5 mg by mouth every evening.) 30 tablet 2  ? Naphazoline-Pheniramine (VISINE-A OP) Place 1 drop into both eyes daily as needed (dry eyes).    ? norelgestromin-ethinyl estradiol (ORTHO EVRA) 150-35 MCG/24HR transdermal patch Place 1 patch onto the skin once a week. (Patient taking differently: Place 1 patch onto the skin every Sunday.) 3 patch 11  ? oxymetazoline (AFRIN) 0.05 % nasal spray Place 1 spray into both nostrils daily as needed for congestion.    ? rosuvastatin (CRESTOR) 20 MG tablet Take 1 tablet (20 mg total) by mouth daily. 30 tablet 0  ? ?No facility-administered medications prior to visit.  ? ? ?No Known Allergies ? ?ROS ?Review of Systems  ?Constitutional:  Negative for fatigue, fever and unexpected weight change.  ?HENT:  Negative for facial swelling, sinus pain and tinnitus.   ?Eyes:  Negative for pain and itching.  ?Respiratory:  Negative for cough and shortness of breath.   ?Cardiovascular:  Negative for leg swelling.  ?Gastrointestinal:  Positive for constipation. Negative for diarrhea.  ?Endocrine: Negative for polyphagia and polyuria.  ?Genitourinary:  Negative for difficulty urinating, dysuria and hematuria.  ?Musculoskeletal:  Negative for neck pain and neck stiffness.  ?Skin:  Negative for rash.  ?Neurological:  Negative for tremors, speech difficulty, weakness, light-headedness, numbness and headaches.  ?Psychiatric/Behavioral:  Negative for behavioral problems.   ? ?  ?Objective:  ?  ?Physical Exam ?Constitutional:   ?   Appearance: Normal appearance.  ?HENT:  ?   Head: Normocephalic and atraumatic.  ?   Nose: Nose  normal.  ?   Mouth/Throat:  ?   Mouth: Mucous membranes are moist.  ?Eyes:  ?   Extraocular Movements: Extraocular movements intact.  ?   Conjunctiva/sclera: Conjunctivae normal.  ?Cardiovascular:  ?   Rate and Rhythm: Normal rate and regular rhythm.  ?   Pulses: Normal pulses.  ?   Heart sounds: Normal heart sounds. No murmur heard. ?Pulmonary:  ?   Breath sounds: Normal breath sounds. No wheezing or  rales.  ?Abdominal:  ?   Palpations: Abdomen is soft.  ?   Tenderness: There is no abdominal tenderness.  ?Musculoskeletal:  ?   Right lower leg: No edema.  ?   Left lower leg: No edema.  ?Skin: ?   General: Skin is warm.  ?   Findings: No rash.  ?Neurological:  ?   General: No focal deficit present.  ?   Mental Status: She is alert and oriented to person, place, and time.  ?   Cranial Nerves: No cranial nerve deficit.  ?   Sensory: No sensory deficit.  ?   Motor: No weakness.  ?   Gait: Gait normal.  ?Psychiatric:     ?   Mood and Affect: Mood normal.     ?   Behavior: Behavior normal.  ? ? ?BP 132/82   Pulse 93   Ht '5\' 4"'$  (1.626 m)   Wt 170 lb 6.4 oz (77.3 kg)   SpO2 96%   BMI 29.25 kg/m?  ?Wt Readings from Last 3 Encounters:  ?07/17/21 170 lb 6.4 oz (77.3 kg)  ?08/28/20 174 lb 12.8 oz (79.3 kg)  ?07/23/20 174 lb 9.6 oz (79.2 kg)  ? ? ? ?Health Maintenance Due  ?Topic Date Due  ? COVID-19 Vaccine (3 - Booster for Moderna series) 02/24/2020  ? COLONOSCOPY (Pts 45-94yr Insurance coverage will need to be confirmed)  Never done  ? PAP SMEAR-Modifier  01/11/2021  ? ? ?There are no preventive care reminders to display for this patient. ? ?Lab Results  ?Component Value Date  ? TSH 1.356 07/06/2021  ? ?Lab Results  ?Component Value Date  ? WBC 6.8 07/07/2021  ? HGB 8.2 (L) 07/07/2021  ? HCT 27.6 (L) 07/07/2021  ? MCV 68.0 (L) 07/07/2021  ? PLT 466 (H) 07/07/2021  ? ?Lab Results  ?Component Value Date  ? NA 136 07/07/2021  ? K 3.6 07/07/2021  ? CO2 23 07/07/2021  ? GLUCOSE 91 07/07/2021  ? BUN 5 (L) 07/07/2021  ?  CREATININE 0.66 07/07/2021  ? BILITOT 0.4 07/05/2021  ? ALKPHOS 45 07/05/2021  ? AST 23 07/05/2021  ? ALT 9 07/05/2021  ? PROT 6.1 (L) 07/05/2021  ? ALBUMIN 3.0 (L) 07/05/2021  ? CALCIUM 8.6 (L) 07/07/2021  ? AN

## 2021-07-17 NOTE — Assessment & Plan Note (Signed)
The patient was discharged from the Ed on DAPT, aspirin plus plavix, and statin, which she has been adherent on. ?The patient denies headaches, dizziness, numbness, and weakness. ? BP today is 132/80.  ? The patient was scheduled to follow up with Dr. Burt Knack on 3/29, but the appt was canceled because the cardiologist noted no shunt (PFO) in the heart. The patient has an appt with hematology on 07/24/2021. ? ?

## 2021-07-17 NOTE — Patient Instructions (Addendum)
Nice meeting you Margaret Owen ? ?You received your Vit B 12 shot today 1000 mcg [place] and will schedule a weekly appt for your Vit 12 shot for 1 month until your levels are within normal limits. ?Continue your taking your medications as prescribed ?No changes made today to your medications ? ? RTO on 10/17/2021 for your annual and Pap smear ?

## 2021-07-17 NOTE — Assessment & Plan Note (Signed)
The patient received a cyanocobalamin Vit 12 injection on 07/06/21 in the ED when B12 levels were 113.  ?The patient received a cyanocobalamin Vit B12 injection today at the office and will return for weekly injections for four weeks until complete remission and monthly maintenance injections.  ?  ?

## 2021-07-17 NOTE — Assessment & Plan Note (Signed)
Stable on Norvasc'10mg'$  tablet daily ?BP today is 132/82 ?

## 2021-07-17 NOTE — Assessment & Plan Note (Signed)
Hospital chart reviewed, including blood tests and imaging ?Episode of TIA, now resolved -on DAPT and statin now, follow-up with neurology ?

## 2021-07-22 ENCOUNTER — Encounter: Payer: Self-pay | Admitting: Internal Medicine

## 2021-07-22 NOTE — Progress Notes (Signed)
? ? [place] ?[phone]   [phone] ? ?CONSULT NOTE ? ?REFERRING PHYSICIAN: Hospital  ? ?REASON FOR CONSULTATION:  ?Possible protein S deficiency  ? ?HPI ?Margaret Owen is a 46 y.o. female with a past medical history significant for hypertension, TIA diagnosed in March 2023, iron deficiency since 2018 due to fibroids/menorrhagia, and vitamin B12 deficiency is referred to the clinic for hypercoagulable state. ? ?The patient was recently hospitalized after presenting to the emergency room for sudden onset of left upper extremity numbness, weakness, left hand paresthesias and mild slurred speech and facial twitching on 07/05/21.  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 No associated hemorrhage or mass effect.  While admitted to the hospital, she had a hypercoagulable panel performed which showed a total protein S low 55 and protein S activity low at 28.  She was referred to the clinic for further evaluation and recommendations.  She is currently taking Plavix and aspirin.  She was seen by cardiology for possible left to right shunt with possible PFO from TEE.  No left atrial/left atrial appendage thrombus was detected. She also is expected to see a neurologist later this month. ? ?Regarding a hypercoagulable state, the patient denies any prior history of PE or myocardial infarction.  The patient denies any prior history of DVT.  The patient denies any hospitalizations or prolonged immobility the month leading up to her hospitalization.  The patient denies any long flights or car rides leading up to her hospitalization.  She denies any prior trauma or injuries leading up to his hospitalization.  ? ?She is feeling well today without any concerning complaints.  She does report she has some bilateral lower extremity swelling  with the left slightly greater than the right.  No overlying skin changes or calf pain.  She had a lower extremity bilateral Doppler ultrasound which was negative for any DVT.  No chest pain or shortness of breath.  No slurred speech upper extremity weakness, paresthesias, or numbness and tingling.  ? ?Regarding her anemia, the patient reports she has heavy menstrual periods.  She has fibroids.  She is taking an iron supplement since being discharged from the hospital and is compliant with this.  She is currently taking a hormonal patch for birth control. ? ?Regarding her family medical history, her mother had a history of strokes/TIAs secondary to vasculitis.  Her mother also had prediabetes, arthritis, and cholelithiasis.  The patient's father had heart failure and prostate cancer.  The patient's maternal aunts have breast cancer and colorectal cancer.  The aunt with colorectal cancer also has a history of a stroke.  The patient has a maternal cousin who is being evaluated for a reported blood clot.  The patient believes that the blood clot may have been in the lung.  Unclear if the cousin has had a hypercoagulable work-up.  The patient has a brother and a sister who are healthy besides glaucoma, asthma, and allergies. ? 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  She is married and has 3 children.  Denies any smoking or drug use history.  She estimates she drinks less than 1 alcoholic beverage per month. ? ? ?HPI ? ?Past Medical History:  ?Diagnosis Date  ? Bell palsy   ? when she was a teenager  ? Breakthrough bleeding associated with intrauterine device (  IUD) 03/22/2017  ? Hypertension   ? IUD contraception 10/05/2016  ? Malpositioned IUD 04/14/2017  ? REmoved 04/14/17   ? MIGRAINES, HX OF 06/11/2008  ? Qualifier: Diagnosis of  By: Truett Mainland MD, Christine    ? Pedal edema 10/05/2016  ? ? ?Past Surgical History:  ?Procedure Laterality Date  ? CERVICAL CERCLAGE    ? CESAREAN SECTION    ? 2000,2003,2005  ?  CESAREAN SECTION  08/17/1998  ? TEE WITHOUT CARDIOVERSION N/A 07/08/2021  ? Procedure: TRANSESOPHAGEAL ECHOCARDIOGRAM (TEE);  Surgeon: Werner Lean, MD;  Location: [facility] ENDOSCOPY;  Service: Cardiovascular;  Laterality: N/A;  being done in cath lab holding  ? ? ?Family History  ?Problem Relation Age of Onset  ? Heart failure Father   ? Alcohol abuse Father   ? Heart disease Father 22  ?     died at 43  ? Hypertension Father   ? Stroke Mother   ? Hypertension Mother   ? Arthritis Mother   ? Asthma Mother   ? Diabetes Mother   ? Hyperlipidemia Mother   ? Glaucoma Mother   ? Breast cancer Maternal Aunt   ? Colon cancer Maternal Aunt   ? Stroke Maternal Aunt   ? Glaucoma Sister   ? Asthma Brother   ? ? ?Social History ?Social History  ? ?Tobacco Use  ? Smoking status: Never  ? Smokeless tobacco: Never  ?Vaping Use  ? Vaping Use: Never used  ?Substance Use Topics  ? Alcohol use: Yes  ?  Comment: occasional  ? Drug use: No  ? ? ?No Known Allergies ? ?Current Outpatient Medications  ?Medication Sig Dispense Refill  ? Acetaminophen (MIDOL PO) Take 2 tablets by mouth daily as needed (cramping).    ? amLODipine (NORVASC) 10 MG tablet Take 1 tablet (10 mg total) by mouth daily. 90 tablet 1  ? aspirin EC 81 MG EC tablet Take 1 tablet (81 mg total) by mouth daily. Swallow whole. 30 tablet 1  ? clopidogrel (PLAVIX) 75 MG tablet Take 1 tablet (75 mg total) by mouth daily. 30 tablet 0  ? ibuprofen (ADVIL) 200 MG tablet Take 400 mg by mouth every 6 (six) hours as needed for headache or moderate pain.    ? levocetirizine (XYZAL) 5 MG tablet TAKE 1 TABLET(5 MG) BY MOUTH EVERY EVENING (Patient taking differently: Take 5 mg by mouth every evening.) 30 tablet 2  ? Naphazoline-Pheniramine (VISINE-A OP) Place 1 drop into both eyes daily as needed (dry eyes).    ? norelgestromin-ethinyl estradiol (ORTHO EVRA) 150-35 MCG/24HR transdermal patch Place 1 patch onto the skin once a week. (Patient taking differently: Place 1 patch onto the  skin every Sunday.) 3 patch 11  ? oxymetazoline (AFRIN) 0.05 % nasal spray Place 1 spray into both nostrils daily as needed for congestion.    ? rosuvastatin (CRESTOR) 20 MG tablet Take 1 tablet (20 mg total) by mouth daily. 30 tablet 0  ? ?No current facility-administered medications for this visit.  ? ? ?REVIEW OF SYSTEMS:   ?Review of Systems  ?Constitutional: Negative for appetite change, chills, fatigue, fever and unexpected weight change.  ?HENT: Negative for mouth sores, nosebleeds, sore throat and trouble swallowing.   ?Eyes: Negative for eye problems and icterus.  ?Respiratory: Negative for cough, hemoptysis, shortness of breath and wheezing.   ?Cardiovascular: Negative for chest pain.  Positive for lower extremity swelling. ?Gastrointestinal: Negative for abdominal pain, constipation, diarrhea, nausea and vomiting.  ?Genitourinary: Negative for bladder incontinence, difficulty  urinating, dysuria, frequency and hematuria.   ?Musculoskeletal: Negative for back pain, gait problem, neck pain and neck stiffness.  ?Skin: Negative for itching and rash.  ?Neurological: Negative for dizziness, extremity weakness, gait problem, headaches, light-headedness and seizures.  ?Hematological: Negative for adenopathy. Does not bruise/bleed easily.  ?Psychiatric/Behavioral: Negative for confusion, depression and sleep disturbance. The patient is not nervous/anxious.   ? ? ?PHYSICAL EXAMINATION:  ?There were no vitals taken for this visit. ? ?ECOG PERFORMANCE STATUS: 0 ? ?Physical Exam  ?Constitutional: Oriented to person, place, and time and well-developed, well-nourished, and in no distress.  ?HENT:  ?Head: Normocephalic and atraumatic.  ?Mouth/Throat: Oropharynx is clear and moist. No oropharyngeal exudate.  ?Eyes: Conjunctivae are normal. Right eye exhibits no discharge. Left eye exhibits no discharge. No scleral icterus.  ?Neck: Normal range of motion. Neck supple.  ?Cardiovascular: Normal rate, regular rhythm, normal  heart sounds and intact distal pulses.   ?Pulmonary/Chest: Effort normal and breath sounds normal. No respiratory distress. No wheezes. No rales.  ?Abdominal: Soft. Bowel sounds are normal. Exhibits no

## 2021-07-24 ENCOUNTER — Other Ambulatory Visit: Payer: Self-pay

## 2021-07-24 ENCOUNTER — Ambulatory Visit (INDEPENDENT_AMBULATORY_CARE_PROVIDER_SITE_OTHER): Payer: BC Managed Care – PPO | Admitting: *Deleted

## 2021-07-24 ENCOUNTER — Other Ambulatory Visit: Payer: Self-pay | Admitting: Physician Assistant

## 2021-07-24 ENCOUNTER — Inpatient Hospital Stay: Payer: BC Managed Care – PPO | Attending: Physician Assistant | Admitting: Physician Assistant

## 2021-07-24 ENCOUNTER — Inpatient Hospital Stay: Payer: BC Managed Care – PPO

## 2021-07-24 DIAGNOSIS — D6859 Other primary thrombophilia: Secondary | ICD-10-CM

## 2021-07-24 DIAGNOSIS — E538 Deficiency of other specified B group vitamins: Secondary | ICD-10-CM | POA: Diagnosis not present

## 2021-07-24 DIAGNOSIS — D509 Iron deficiency anemia, unspecified: Secondary | ICD-10-CM | POA: Insufficient documentation

## 2021-07-24 LAB — CBC WITH DIFFERENTIAL (CANCER CENTER ONLY)
Abs Immature Granulocytes: 0.03 10*3/uL (ref 0.00–0.07)
Basophils Absolute: 0.1 10*3/uL (ref 0.0–0.1)
Basophils Relative: 1 %
Eosinophils Absolute: 0.3 10*3/uL (ref 0.0–0.5)
Eosinophils Relative: 4 %
HCT: 30.4 % — ABNORMAL LOW (ref 36.0–46.0)
Hemoglobin: 8.6 g/dL — ABNORMAL LOW (ref 12.0–15.0)
Immature Granulocytes: 0 %
Lymphocytes Relative: 29 %
Lymphs Abs: 2 10*3/uL (ref 0.7–4.0)
MCH: 19.5 pg — ABNORMAL LOW (ref 26.0–34.0)
MCHC: 28.3 g/dL — ABNORMAL LOW (ref 30.0–36.0)
MCV: 68.9 fL — ABNORMAL LOW (ref 80.0–100.0)
Monocytes Absolute: 0.5 10*3/uL (ref 0.1–1.0)
Monocytes Relative: 7 %
Neutro Abs: 4.1 10*3/uL (ref 1.7–7.7)
Neutrophils Relative %: 59 %
Platelet Count: 263 10*3/uL (ref 150–400)
RBC: 4.41 MIL/uL (ref 3.87–5.11)
RDW: 19.4 % — ABNORMAL HIGH (ref 11.5–15.5)
WBC Count: 6.9 10*3/uL (ref 4.0–10.5)
nRBC: 0 % (ref 0.0–0.2)

## 2021-07-24 LAB — CMP (CANCER CENTER ONLY)
ALT: 18 U/L (ref 0–44)
AST: 18 U/L (ref 15–41)
Albumin: 3.9 g/dL (ref 3.5–5.0)
Alkaline Phosphatase: 64 U/L (ref 38–126)
Anion gap: 6 (ref 5–15)
BUN: 10 mg/dL (ref 6–20)
CO2: 27 mmol/L (ref 22–32)
Calcium: 9.1 mg/dL (ref 8.9–10.3)
Chloride: 105 mmol/L (ref 98–111)
Creatinine: 0.65 mg/dL (ref 0.44–1.00)
GFR, Estimated: >60 mL/min (ref >60)
Glucose, Bld: 124 mg/dL — ABNORMAL HIGH (ref 70–99)
Potassium: 3.7 mmol/L (ref 3.5–5.1)
Sodium: 138 mmol/L (ref 135–145)
Total Bilirubin: 0.3 mg/dL (ref 0.3–1.2)
Total Protein: 7.4 g/dL (ref 6.5–8.1)

## 2021-07-24 MED ORDER — CYANOCOBALAMIN 1000 MCG/ML IJ SOLN
1000.0000 ug | Freq: Once | INTRAMUSCULAR | Status: AC
  Administered 2021-07-24: 1000 ug via INTRAMUSCULAR

## 2021-07-25 ENCOUNTER — Encounter: Payer: Self-pay | Admitting: Physician Assistant

## 2021-07-26 LAB — PROTEIN S PANEL
Protein S Activity: 50 % — ABNORMAL LOW (ref 63–140)
Protein S Ag, Free: 71 % (ref 61–136)
Protein S Ag, Total: 67 % (ref 60–150)

## 2021-07-30 ENCOUNTER — Other Ambulatory Visit: Payer: Self-pay | Admitting: Internal Medicine

## 2021-07-30 ENCOUNTER — Other Ambulatory Visit: Payer: Self-pay | Admitting: Physician Assistant

## 2021-07-30 DIAGNOSIS — I10 Essential (primary) hypertension: Secondary | ICD-10-CM

## 2021-07-30 NOTE — Progress Notes (Signed)
I reviewed the patient's protein S panel with Dr. Julien Nordmann. He stated that there is no need to put her on long-term anticoagulation.  Either way, we are going to see the patient back in a few weeks and she needs to continue on it until then. We will address these labs when we see her at her next appointment.  ?

## 2021-07-31 ENCOUNTER — Ambulatory Visit (INDEPENDENT_AMBULATORY_CARE_PROVIDER_SITE_OTHER): Payer: BC Managed Care – PPO

## 2021-07-31 VITALS — BP 122/79 | HR 66 | Temp 98.5°F | Resp 18 | Ht 64.0 in | Wt 170.0 lb

## 2021-07-31 DIAGNOSIS — D509 Iron deficiency anemia, unspecified: Secondary | ICD-10-CM

## 2021-07-31 MED ORDER — ACETAMINOPHEN 325 MG PO TABS
650.0000 mg | ORAL_TABLET | Freq: Once | ORAL | Status: AC
  Administered 2021-07-31: 650 mg via ORAL
  Filled 2021-07-31: qty 2

## 2021-07-31 MED ORDER — SODIUM CHLORIDE 0.9 % IV SOLN
300.0000 mg | Freq: Once | INTRAVENOUS | Status: AC
  Administered 2021-07-31: 300 mg via INTRAVENOUS
  Filled 2021-07-31: qty 15

## 2021-07-31 MED ORDER — DIPHENHYDRAMINE HCL 25 MG PO CAPS
25.0000 mg | ORAL_CAPSULE | Freq: Once | ORAL | Status: AC
  Administered 2021-07-31: 25 mg via ORAL
  Filled 2021-07-31: qty 1

## 2021-07-31 NOTE — Progress Notes (Signed)
Diagnosis: Iron Deficiency Anemia ? ?Provider:  Marshell Garfinkel, MD ? ?Procedure: Infusion ? ?IV Type: Peripheral, IV Location: L Antecubital ? ?Venofer (Iron Sucrose), Dose: 300 mg ? ?Infusion Start Time: 4129 ? ?Infusion Stop Time: 1519 pm ? ?Post Infusion IV Care: Observation period completed and Peripheral IV Discontinued ? ?Discharge: Condition: Good, Destination: Home . AVS provided to patient.  ? ?Performed by:  Koren Shiver, RN  ?  ?

## 2021-08-01 ENCOUNTER — Ambulatory Visit (INDEPENDENT_AMBULATORY_CARE_PROVIDER_SITE_OTHER): Payer: BC Managed Care – PPO | Admitting: Internal Medicine

## 2021-08-01 DIAGNOSIS — E538 Deficiency of other specified B group vitamins: Secondary | ICD-10-CM | POA: Diagnosis not present

## 2021-08-01 MED ORDER — CYANOCOBALAMIN 1000 MCG/ML IJ SOLN
1000.0000 ug | Freq: Once | INTRAMUSCULAR | Status: AC
  Administered 2021-08-01: 1000 ug via INTRAMUSCULAR

## 2021-08-04 ENCOUNTER — Encounter: Payer: Self-pay | Admitting: Internal Medicine

## 2021-08-06 ENCOUNTER — Encounter: Payer: Self-pay | Admitting: Internal Medicine

## 2021-08-06 ENCOUNTER — Other Ambulatory Visit: Payer: Self-pay | Admitting: Internal Medicine

## 2021-08-06 DIAGNOSIS — E782 Mixed hyperlipidemia: Secondary | ICD-10-CM

## 2021-08-06 DIAGNOSIS — G459 Transient cerebral ischemic attack, unspecified: Secondary | ICD-10-CM

## 2021-08-06 MED ORDER — ROSUVASTATIN CALCIUM 20 MG PO TABS: 20.0000 mg | ORAL_TABLET | Freq: Every day | ORAL | 3 refills | Status: DC

## 2021-08-06 MED ORDER — CLOPIDOGREL BISULFATE 75 MG PO TABS: 75.0000 mg | ORAL_TABLET | Freq: Every day | ORAL | 0 refills | Status: DC

## 2021-08-07 ENCOUNTER — Ambulatory Visit (INDEPENDENT_AMBULATORY_CARE_PROVIDER_SITE_OTHER): Payer: BC Managed Care – PPO

## 2021-08-07 VITALS — BP 115/72 | HR 68 | Temp 98.3°F | Resp 18 | Ht 64.0 in | Wt 169.4 lb

## 2021-08-07 DIAGNOSIS — D509 Iron deficiency anemia, unspecified: Secondary | ICD-10-CM | POA: Diagnosis not present

## 2021-08-07 MED ORDER — SODIUM CHLORIDE 0.9 % IV SOLN
300.0000 mg | Freq: Once | INTRAVENOUS | Status: AC
  Administered 2021-08-07: 300 mg via INTRAVENOUS
  Filled 2021-08-07: qty 15

## 2021-08-07 MED ORDER — ACETAMINOPHEN 325 MG PO TABS
650.0000 mg | ORAL_TABLET | Freq: Once | ORAL | Status: AC
  Administered 2021-08-07: 650 mg via ORAL
  Filled 2021-08-07: qty 2

## 2021-08-07 MED ORDER — DIPHENHYDRAMINE HCL 25 MG PO CAPS
25.0000 mg | ORAL_CAPSULE | Freq: Once | ORAL | Status: AC
  Administered 2021-08-07: 25 mg via ORAL
  Filled 2021-08-07: qty 1

## 2021-08-07 NOTE — Progress Notes (Signed)
Diagnosis: Iron Deficiency Anemia ? ?Provider:  Marshell Garfinkel, MD ? ?Procedure: Infusion ? ?IV Type: Peripheral, IV Location: L Antecubital ? ?Venofer (Iron Sucrose), Dose: 300 mg ? ?Infusion Start Time: 7340 ? ?Infusion Stop Time: 3709 ? ?Post Infusion IV Care: Peripheral IV Discontinued ? ?Discharge: Condition: Good, Destination: Home . AVS provided to patient.  ? ?Performed by:  Paul Dykes, RN  ?  ?

## 2021-08-11 NOTE — Progress Notes (Signed)
?[facility] ?[address] ?[place]. [place] [address] ?[phone] ? ?     HOSPITAL FOLLOW UP NOTE ? ?Ms. Margaret Owen ?Date of Birth:  [birth year] ?Medical Record Number:  [id number]  ? ?Reason for Referral:  hospital stroke follow up ? ? ? ?SUBJECTIVE: ? ? ?CHIEF COMPLAINT:  ?Chief Complaint  ?Patient presents with  ? Follow-up  ?  Pt alone, rm 12. Here for follow up post hospital visit from 07/05/2021. Overall she feels things are well. She has not had any more of the symptoms. She has started iron infusions and b 12 shots weekly.  ? ? ?HPI:  ? ?Margaret Owen is a 46 y.o. who  has a past medical history of Bell palsy, Breakthrough bleeding associated with intrauterine device (IUD) (03/22/2017), Hypertension, IUD contraception (10/05/2016), Malpositioned IUD (04/14/2017), MIGRAINES, HX OF (06/11/2008), and Pedal edema (10/05/2016).  Patient presented on 07/05/2021 with cute onset left upper extremity numbness, weakness, left hand paresthesias, mild slurred speech and facial twitching. She reports having a headache associated with hunger earlier that day. Headache had resolved with eating and prior to symptom onset. CT head negative. 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 TTE normal EF. TEE positive left to right shunt with possible PFO. Cardiology consulted for closure outpatient. Neurology recommended DAPT with asa and Plavix. She was discharged home 07/08/2021. Personally reviewed hospitalization pertinent progress notes, lab work and imaging.  Evaluated by Dr Erlinda Hong.  ? ?Since discharge, she is doing well. No deficits noted. She is back to work. She is followed by PCP.  ? ?Dr Burt Knack reviewed TEE with Dr Gasper Sells and did not feel patient had PFO. Appt was cancelled. Dr Erlinda Hong referred her to hematology for low protein S activity. She was seen 07/24/2021. Labs repeated and iron transfusions started. She  is getting B12 injections with PCP. She is using Ortho Evra for contraception and dysmenorrhea. She has continued asa '81mg'$  and Plavix '75mg'$  daily. No obvious adverse effects. She is also taking rosuvastatin and tolerating well.  ? ?She does have a history of migraines. She denies history of seizures. Mom has seizures thought to be related to damage from previous stroke. She does not smoke. No regular alcohol use.  ? ? ?PERTINENT IMAGING/LABS ? 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  ? ? ?A1C ?Lab Results  ?Component Value Date  ? HGBA1C 5.6 07/06/2021  ? ? ?Lipid Panel  ?   ?Component Value Date/Time  ? CHOL 174 07/06/2021 0623  ? TRIG 79 07/06/2021 0623  ? HDL 42 07/06/2021 0623  ? CHOLHDL 4.1 07/06/2021 0623  ? VLDL 16 07/06/2021 0623  ? [place] 116 (H) 07/06/2021 1062  ? [place] 115 (H) 11/14/2019 0733  ? ? ?ROS:   ?14 system review of systems performed and negative with exception of those listed in HPI ? ?PMH:  ?Past Medical History:  ?Diagnosis Date  ? Bell palsy   ? when she was a teenager  ? Breakthrough bleeding associated with intrauterine device (IUD) 03/22/2017  ? Hypertension   ? IUD contraception 10/05/2016  ? Malpositioned IUD 04/14/2017  ? REmoved 04/14/17   ?  MIGRAINES, HX OF 06/11/2008  ? Qualifier: Diagnosis of  By: Truett Mainland MD, Christine    ? Pedal edema 10/05/2016  ? ? ?PSH:  ?Past Surgical History:  ?Procedure Laterality Date  ? CERVICAL CERCLAGE    ? CESAREAN SECTION    ? 2000,2003,2005  ? CESAREAN SECTION  08/17/1998  ? TEE WITHOUT CARDIOVERSION N/A 07/08/2021  ? Procedure: TRANSESOPHAGEAL ECHOCARDIOGRAM (TEE);  Surgeon: Werner Lean, MD;  Location: [facility] ENDOSCOPY;  Service: Cardiovascular;  Laterality: N/A;   being done in cath lab holding  ? ? ?Social History:  ?Social History  ? ?Socioeconomic History  ? Marital status: Married  ?  Spouse name: Harrell Gave  ? Number of children: 3  ? Years of education: 22  ? Highest education level: Not on file  ?Occupational History  ? Occupation: Artist  ?  Comment: state of [place]  ?Tobacco Use  ? Smoking status: Never  ? Smokeless tobacco: Never  ?Vaping Use  ? Vaping Use: Never used  ?Substance and Sexual Activity  ? Alcohol use: Yes  ?  Comment: occasional  ? Drug use: No  ? Sexual activity: Yes  ?  Birth control/protection: Patch  ?Other Topics Concern  ? Not on file  ?Social History Narrative  ? Husband is a Stephanie Coup  ? Lives with husband Christopher-married 24 years  ? Three daughters: all live at home 58, 17,15   ? Works for [employer] [place], Art gallery manager  ?   ? Dog: Bandit   ?   ? Enjoys: gardening, flowers  ?   ? Diet: eat all food groups  ? Caffeine: 2 daily  ? Water: 2 bottles daily   ?   ? Wears seat belt   ? Does not use phone while driving   ? Smoke detectors at home   ? ?Social Determinants of Health  ? ?Financial Resource Strain: Not on file  ?Food Insecurity: Not on file  ?Transportation Needs: Not on file  ?Physical Activity: Not on file  ?Stress: Not on file  ?Social Connections: Not on file  ?Intimate Partner Violence: Not on file  ? ? ?Family History:  ?Family History  ?Problem Relation Age of Onset  ? Heart failure Father   ? Alcohol abuse Father   ? Heart disease Father 30  ?     died at 52  ? Hypertension Father   ? Stroke Mother   ? Hypertension Mother   ? Arthritis Mother   ? Asthma Mother   ? Diabetes Mother   ? Hyperlipidemia Mother   ? Glaucoma Mother   ? Breast cancer Maternal Aunt   ? Colon cancer Maternal Aunt   ? Stroke Maternal Aunt   ? Glaucoma Sister   ? Asthma Brother   ? ? ?Medications:   ?Current Outpatient Medications on File Prior to Visit  ?Medication Sig Dispense Refill  ? Acetaminophen (MIDOL PO) Take 2 tablets by mouth daily as needed  (cramping).    ? amLODipine (NORVASC) 10 MG tablet TAKE 1 TABLET(10 MG) BY MOUTH DAILY 90 tablet 1  ? aspirin EC 81 MG EC tablet Take 1 tablet (81 mg total) by mouth daily. Swallow whole. 30 tablet 1  ? clopidogrel (PLAVIX) 75 MG tablet TAKE 1 TABLET(75 MG) BY MOUTH DAILY 90 tablet 0  ? Cyanocobalamin (B-12 IJ) Inject as directed once a week.    ? ferrous sulfate 325 (65 FE) MG tablet Take 325 mg by mouth daily with breakfast.    ? iron  sucrose (VENOFER) 20 MG/ML injection Inject 100 mg into the vein once a week.    ? Naphazoline-Pheniramine (VISINE-A OP) Place 1 drop into both eyes daily as needed (dry eyes).    ? norelgestromin-ethinyl estradiol (ORTHO EVRA) 150-35 MCG/24HR transdermal patch Place 1 patch onto the skin once a week. (Patient taking differently: Place 1 patch onto the skin every Sunday.) 3 patch 11  ? rosuvastatin (CRESTOR) 20 MG tablet Take 1 tablet (20 mg total) by mouth daily. 90 tablet 3  ? ibuprofen (ADVIL) 200 MG tablet Take 400 mg by mouth every 6 (six) hours as needed for headache or moderate pain. (Patient not taking: Reported on 08/12/2021)    ? ?No current facility-administered medications on file prior to visit.  ? ? ?Allergies:  No Known Allergies ? ? ?OBJECTIVE: ? ?Physical Exam ? ?Vitals:  ? 08/12/21 0913  ?BP: 121/78  ?Pulse: 75  ?Weight: 171 lb (77.6 kg)  ?Height: '5\' 4"'$  (1.626 m)  ? ?Body mass index is 29.35 kg/m?Marland Kitchen ?No results found. ? ? ?  07/17/2021  ?  8:16 AM  ?Depression screen PHQ 2/9  ?Decreased Interest 0  ?Down, Depressed, Hopeless 1  ?PHQ - 2 Score 1  ?Altered sleeping 2  ?Tired, decreased energy 1  ?Change in appetite 0  ?Feeling bad or failure about yourself  1  ?Trouble concentrating 0  ?Moving slowly or fidgety/restless 0  ?Suicidal thoughts 0  ?PHQ-9 Score 5  ?Difficult doing work/chores Not difficult at all  ?  ? ?General: well developed, well nourished, seated, in no evident distress ?Head: head normocephalic and atraumatic.   ?Neck: supple with no carotid or  supraclavicular bruits ?Cardiovascular: regular rate and rhythm, no murmurs ?Musculoskeletal: no deformity ?Skin:  no rash/petichiae ?Vascular:  Normal pulses all extremities ?  ?Neurologic Exam ?Mental Statu

## 2021-08-11 NOTE — Patient Instructions (Addendum)
Below is our plan: ? 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 Continue aspirin 81 mg daily and rosuvastatin '20mg'$  daily  for secondary stroke prevention. DISCONTINUE PLAVIX.  Discussed secondary stroke prevention measures and importance of close PCP follow up for aggressive stroke risk factor management. I have gone over the pathophysiology of stroke, warning signs and symptoms, risk factors and their management in some detail with instructions to go to the closest emergency room for symptoms of concern. ?HTN: BP goal <130/90.  Stable on amlodipine '10mg'$  daily. Continue  per PCP ?HLD: LDL goal <70. Recent LDL 116. Continue rosuvastatin '20mg'$  daily per PCP.  ?DMII: A1c goal<7.0. Recent A1c 5.6.  ?B12 deficiency: follow up with PCP  ?Anemia, protein S deficiency: continue close follow up with hematology  ? ?Please make sure you are staying well hydrated. I recommend 50-60 ounces daily. Well balanced diet and regular exercise encouraged. Consistent sleep schedule with 6-8 hours recommended.  ? ?Please continue follow up with care team as directed.  ? ?Follow up with me in 6 months  ? ?You may receive a survey regarding today's visit. I encourage you to leave honest feed back as I do use this information to improve patient care. Thank you for seeing me today!  ? ? ?

## 2021-08-12 ENCOUNTER — Ambulatory Visit (INDEPENDENT_AMBULATORY_CARE_PROVIDER_SITE_OTHER): Payer: BC Managed Care – PPO | Admitting: Family Medicine

## 2021-08-12 ENCOUNTER — Encounter: Payer: Self-pay | Admitting: Family Medicine

## 2021-08-12 VITALS — BP 121/78 | HR 75 | Ht 64.0 in | Wt 171.0 lb

## 2021-08-12 DIAGNOSIS — D6859 Other primary thrombophilia: Secondary | ICD-10-CM | POA: Diagnosis not present

## 2021-08-12 DIAGNOSIS — I639 Cerebral infarction, unspecified: Secondary | ICD-10-CM

## 2021-08-13 ENCOUNTER — Ambulatory Visit: Payer: BC Managed Care – PPO

## 2021-08-13 ENCOUNTER — Telehealth: Payer: Self-pay | Admitting: Internal Medicine

## 2021-08-13 ENCOUNTER — Encounter: Payer: Self-pay | Admitting: Family Medicine

## 2021-08-13 NOTE — Progress Notes (Signed)
I agree with the above plan 

## 2021-08-13 NOTE — Telephone Encounter (Signed)
Called patient regarding upcoming appointment, left a voicemail. 

## 2021-08-14 ENCOUNTER — Ambulatory Visit (INDEPENDENT_AMBULATORY_CARE_PROVIDER_SITE_OTHER): Payer: BC Managed Care – PPO

## 2021-08-14 VITALS — BP 111/70 | HR 72 | Temp 98.6°F | Resp 14 | Ht 64.0 in | Wt 169.4 lb

## 2021-08-14 DIAGNOSIS — D509 Iron deficiency anemia, unspecified: Secondary | ICD-10-CM

## 2021-08-14 MED ORDER — DIPHENHYDRAMINE HCL 25 MG PO CAPS: 25.0000 mg | ORAL_CAPSULE | Freq: Once | ORAL | Status: DC

## 2021-08-14 MED ORDER — ACETAMINOPHEN 325 MG PO TABS: 650.0000 mg | ORAL_TABLET | Freq: Once | ORAL | Status: DC

## 2021-08-14 MED ORDER — SODIUM CHLORIDE 0.9 % IV SOLN
300.0000 mg | Freq: Once | INTRAVENOUS | Status: AC
  Administered 2021-08-14: 300 mg via INTRAVENOUS
  Filled 2021-08-14: qty 15

## 2021-08-14 NOTE — Progress Notes (Signed)
Diagnosis: Iron Deficiency Anemia ? ?Provider:  Marshell Garfinkel, MD ? ?Procedure: Infusion ? ?IV Type: Peripheral, IV Location: L Antecubital ? ?Venofer (Iron Sucrose), Dose: 300 mg ? ?Infusion Start Time: 5520 ? ?Infusion Stop Time: 8022 ? ?Post Infusion IV Care: Peripheral IV Discontinued ? ?Discharge: Condition: Good, Destination: Home . AVS provided to patient.  ? ?Performed by:  Paul Dykes, RN  ?  ?

## 2021-08-20 ENCOUNTER — Other Ambulatory Visit: Payer: Self-pay | Admitting: Family Medicine

## 2021-08-20 ENCOUNTER — Encounter: Payer: Self-pay | Admitting: Family Medicine

## 2021-08-20 ENCOUNTER — Ambulatory Visit (INDEPENDENT_AMBULATORY_CARE_PROVIDER_SITE_OTHER): Payer: BC Managed Care – PPO | Admitting: *Deleted

## 2021-08-20 DIAGNOSIS — E538 Deficiency of other specified B group vitamins: Secondary | ICD-10-CM

## 2021-08-20 MED ORDER — CYANOCOBALAMIN 1000 MCG/ML IJ SOLN
1000.0000 ug | Freq: Once | INTRAMUSCULAR | Status: AC
  Administered 2021-08-20: 1000 ug via INTRAMUSCULAR

## 2021-08-25 NOTE — Progress Notes (Signed)
Vitamin B12 1000 mcg IM administered. ?

## 2021-08-29 ENCOUNTER — Ambulatory Visit (INDEPENDENT_AMBULATORY_CARE_PROVIDER_SITE_OTHER): Payer: BC Managed Care – PPO

## 2021-08-29 DIAGNOSIS — E538 Deficiency of other specified B group vitamins: Secondary | ICD-10-CM

## 2021-08-29 MED ORDER — CYANOCOBALAMIN 1000 MCG/ML IJ SOLN
1000.0000 ug | Freq: Once | INTRAMUSCULAR | Status: AC
  Administered 2021-08-29: 1000 ug via INTRAMUSCULAR

## 2021-09-04 ENCOUNTER — Inpatient Hospital Stay: Payer: BC Managed Care – PPO | Admitting: Internal Medicine

## 2021-09-04 ENCOUNTER — Encounter: Payer: Self-pay | Admitting: Internal Medicine

## 2021-09-04 ENCOUNTER — Inpatient Hospital Stay: Payer: BC Managed Care – PPO | Attending: Physician Assistant

## 2021-09-04 ENCOUNTER — Other Ambulatory Visit: Payer: Self-pay

## 2021-09-04 VITALS — BP 131/86 | HR 77 | Temp 96.2°F | Resp 17 | Wt 169.4 lb

## 2021-09-04 DIAGNOSIS — E538 Deficiency of other specified B group vitamins: Secondary | ICD-10-CM | POA: Diagnosis not present

## 2021-09-04 DIAGNOSIS — D500 Iron deficiency anemia secondary to blood loss (chronic): Secondary | ICD-10-CM

## 2021-09-04 DIAGNOSIS — I10 Essential (primary) hypertension: Secondary | ICD-10-CM | POA: Insufficient documentation

## 2021-09-04 DIAGNOSIS — N920 Excessive and frequent menstruation with regular cycle: Secondary | ICD-10-CM | POA: Insufficient documentation

## 2021-09-04 DIAGNOSIS — D6859 Other primary thrombophilia: Secondary | ICD-10-CM | POA: Insufficient documentation

## 2021-09-04 DIAGNOSIS — D509 Iron deficiency anemia, unspecified: Secondary | ICD-10-CM

## 2021-09-04 LAB — CMP (CANCER CENTER ONLY)
ALT: 10 U/L (ref 0–44)
AST: 11 U/L — ABNORMAL LOW (ref 15–41)
Albumin: 4 g/dL (ref 3.5–5.0)
Alkaline Phosphatase: 48 U/L (ref 38–126)
Anion gap: 5 (ref 5–15)
BUN: 9 mg/dL (ref 6–20)
CO2: 28 mmol/L (ref 22–32)
Calcium: 9.2 mg/dL (ref 8.9–10.3)
Chloride: 105 mmol/L (ref 98–111)
Creatinine: 0.73 mg/dL (ref 0.44–1.00)
GFR, Estimated: >60 mL/min (ref >60)
Glucose, Bld: 94 mg/dL (ref 70–99)
Potassium: 3.8 mmol/L (ref 3.5–5.1)
Sodium: 138 mmol/L (ref 135–145)
Total Bilirubin: 0.3 mg/dL (ref 0.3–1.2)
Total Protein: 7.3 g/dL (ref 6.5–8.1)

## 2021-09-04 LAB — CBC WITH DIFFERENTIAL (CANCER CENTER ONLY)
Abs Immature Granulocytes: 0.02 10*3/uL (ref 0.00–0.07)
Basophils Absolute: 0 10*3/uL (ref 0.0–0.1)
Basophils Relative: 1 %
Eosinophils Absolute: 0.2 10*3/uL (ref 0.0–0.5)
Eosinophils Relative: 4 %
HCT: 35.7 % — ABNORMAL LOW (ref 36.0–46.0)
Hemoglobin: 11.2 g/dL — ABNORMAL LOW (ref 12.0–15.0)
Immature Granulocytes: 0 %
Lymphocytes Relative: 29 %
Lymphs Abs: 1.3 10*3/uL (ref 0.7–4.0)
MCH: 25.3 pg — ABNORMAL LOW (ref 26.0–34.0)
MCHC: 31.4 g/dL (ref 30.0–36.0)
MCV: 80.8 fL (ref 80.0–100.0)
Monocytes Absolute: 0.3 10*3/uL (ref 0.1–1.0)
Monocytes Relative: 8 %
Neutro Abs: 2.7 10*3/uL (ref 1.7–7.7)
Neutrophils Relative %: 58 %
Platelet Count: 212 10*3/uL (ref 150–400)
RBC: 4.42 MIL/uL (ref 3.87–5.11)
RDW: 23.3 % — ABNORMAL HIGH (ref 11.5–15.5)
WBC Count: 4.5 10*3/uL (ref 4.0–10.5)
nRBC: 0 % (ref 0.0–0.2)

## 2021-09-04 LAB — IRON AND IRON BINDING CAPACITY (CC-WL,HP ONLY)
Iron: 235 ug/dL — ABNORMAL HIGH (ref 28–170)
Saturation Ratios: 58 % — ABNORMAL HIGH (ref 10.4–31.8)
TIBC: 409 ug/dL (ref 250–450)
UIBC: 174 ug/dL (ref 148–442)

## 2021-09-04 LAB — FOLATE: Folate: 12.2 ng/mL (ref >5.9)

## 2021-09-04 LAB — VITAMIN B12: Vitamin B-12: 368 pg/mL (ref 180–914)

## 2021-09-04 LAB — FERRITIN: Ferritin: 58 ng/mL (ref 11–307)

## 2021-09-04 NOTE — Progress Notes (Signed)
[place] Telephone:(336) [phone]   Fax:(336) [phone]  OFFICE PROGRESS NOTE  Lindell Spar, MD [address] [place] [address]  DIAGNOSIS:  1) history of iron deficiency anemia secondary to menorrhagia and vitamin B12 deficiency. 2) nonspecific acquired protein S deficiency  PRIOR THERAPY: Iron infusion with Venofer 300 mg IV weekly for 3 weeks.  CURRENT THERAPY: Monthly vitamin B12 injection 1000 mcg intramuscular x4.  INTERVAL HISTORY: Margaret Owen 46 y.o. female returns to the clinic today for follow-up visit.  The patient is feeling much better today with more energy and sleeping better after starting the B12 injection which she received it for 4 doses.  She also received iron infusion with Venofer.  She denied having any current chest pain, shortness of breath, cough or hemoptysis.  She has no nausea, vomiting, diarrhea or constipation.  She has no headache or visual changes.  She is currently on aspirin 81 mg p.o. daily and her treatment with Plavix was discontinued by her neurologist.  She had repeat protein S panel that showed improvement in her values.  She is not currently on any anticoagulation.  The patient is here today for evaluation and repeat blood work.  MEDICAL HISTORY: Past Medical History:  Diagnosis Date   Bell palsy    when she was a teenager   Breakthrough bleeding associated with intrauterine device (IUD) 03/22/2017   Hypertension    IUD contraception 10/05/2016   Malpositioned IUD 04/14/2017   REmoved 04/14/17    MIGRAINES, HX OF 06/11/2008   Qualifier: Diagnosis of  By: Truett Mainland MD, Christine     Pedal edema 10/05/2016    ALLERGIES:  has No Known Allergies.  MEDICATIONS:  Current Outpatient Medications  Medication Sig Dispense Refill   Acetaminophen (MIDOL PO) Take 2 tablets by mouth daily as needed (cramping).     amLODipine (NORVASC) 10 MG tablet TAKE 1 TABLET(10 MG) BY MOUTH DAILY 90 tablet 1   aspirin EC 81 MG  EC tablet Take 1 tablet (81 mg total) by mouth daily. Swallow whole. 30 tablet 1   Cyanocobalamin (B-12 IJ) Inject as directed once a week.     ferrous sulfate 325 (65 FE) MG tablet Take 325 mg by mouth daily with breakfast.     ibuprofen (ADVIL) 200 MG tablet Take 400 mg by mouth every 6 (six) hours as needed for headache or moderate pain. (Patient not taking: Reported on 08/12/2021)     iron sucrose (VENOFER) 20 MG/ML injection Inject 100 mg into the vein once a week.     Naphazoline-Pheniramine (VISINE-A OP) Place 1 drop into both eyes daily as needed (dry eyes).     norelgestromin-ethinyl estradiol (ORTHO EVRA) 150-35 MCG/24HR transdermal patch Place 1 patch onto the skin once a week. (Patient taking differently: Place 1 patch onto the skin every 'Sunday.) 3 patch 11   rosuvastatin (CRESTOR) 20 MG tablet Take 1 tablet (20 mg total) by mouth daily. 90 tablet 3   No current facility-administered medications for this visit.    SURGICAL HISTORY:  Past Surgical History:  Procedure Laterality Date   CERVICAL CERCLAGE     CESAREAN SECTION     20'$ 00,2003,2005   CESAREAN SECTION  08/17/1998   TEE WITHOUT CARDIOVERSION N/A 07/08/2021   Procedure: TRANSESOPHAGEAL ECHOCARDIOGRAM (TEE);  Surgeon: Werner Lean, MD;  Location: [facility] ENDOSCOPY;  Service: Cardiovascular;  Laterality: N/A;  being done in cath lab holding    REVIEW OF SYSTEMS:  A  comprehensive review of systems was negative except for: Constitutional: positive for fatigue   PHYSICAL EXAMINATION: General appearance: alert, cooperative, fatigued, and no distress Head: Normocephalic, without obvious abnormality, atraumatic Neck: no adenopathy, no JVD, supple, symmetrical, trachea midline, and thyroid not enlarged, symmetric, no tenderness/mass/nodules Lymph nodes: Cervical, supraclavicular, and axillary nodes normal. Resp: clear to auscultation bilaterally Back: symmetric, no curvature. ROM normal. No CVA tenderness. Cardio: regular  rate and rhythm, S1, S2 normal, no murmur, click, rub or gallop GI: soft, non-tender; bowel sounds normal; no masses,  no organomegaly Extremities: extremities normal, atraumatic, no cyanosis or edema  ECOG PERFORMANCE STATUS: 1 - Symptomatic but completely ambulatory  Blood pressure 131/86, pulse 77, temperature (!) 96.2 F (35.7 C), temperature source Tympanic, resp. rate 17, weight 169 lb 6 oz (76.8 kg), SpO2 99 %.  LABORATORY DATA: Lab Results  Component Value Date   WBC 4.5 09/04/2021   HGB 11.2 (L) 09/04/2021   HCT 35.7 (L) 09/04/2021   MCV 80.8 09/04/2021   PLT 212 09/04/2021      Chemistry      Component Value Date/Time   NA 138 07/24/2021 1344   NA 139 02/24/2018 1508   K 3.7 07/24/2021 1344   CL 105 07/24/2021 1344   CO2 27 07/24/2021 1344   BUN 10 07/24/2021 1344   BUN 9 02/24/2018 1508   CREATININE 0.65 07/24/2021 1344   CREATININE 0.61 11/14/2019 0733      Component Value Date/Time   CALCIUM 9.1 07/24/2021 1344   ALKPHOS 64 07/24/2021 1344   AST 18 07/24/2021 1344   ALT 18 07/24/2021 1344   BILITOT 0.3 07/24/2021 1344       RADIOGRAPHIC STUDIES: No results found.  ASSESSMENT AND PLAN: This is a very pleasant 46 years old African-American female with history of iron deficiency anemia secondary to menorrhagia in addition to vitamin B12 deficiency and suspicious protein S deficiency that significantly improved on repeat protein S panel. The patient was treated with Venofer 300 mg IV weekly for 3 weeks in addition to vitamin B12 injection 1000 mcg intramuscular for 4 doses. She had repeat CBC today that showed improvement of her hemoglobin and hematocrit and the patient physically feels much better.  She continues to have menorrhagia secondary to uterine fibroid. Iron study, ferritin and vitamin B12 and serum folate are still pending. I recommended for the patient to continue with the oral iron tablet as well as oral vitamin B12 supplement for now. I will  see her back for follow-up visit in 3 months for evaluation and repeat blood work. If the pending iron study or B12 level are low, will consider the patient for additional treatment with either intravenous iron infusion or vitamin B12 injection. The patient was advised to call immediately if she has any concerning symptoms in the interval. The patient voices understanding of current disease status and treatment options and is in agreement with the current care plan.  All questions were answered. The patient knows to call the clinic with any problems, questions or concerns. We can certainly see the patient much sooner if necessary.  The total time spent in the appointment was 25 minutes.  Disclaimer: This note was dictated with voice recognition software. Similar sounding words can inadvertently be transcribed and may not be corrected upon review.

## 2021-10-08 ENCOUNTER — Telehealth: Payer: Self-pay | Admitting: Internal Medicine

## 2021-10-08 NOTE — Telephone Encounter (Signed)
Called patient regarding upcoming August appointments, left a voicemail. 

## 2021-10-23 ENCOUNTER — Encounter: Payer: Self-pay | Admitting: Internal Medicine

## 2021-10-23 ENCOUNTER — Ambulatory Visit (INDEPENDENT_AMBULATORY_CARE_PROVIDER_SITE_OTHER): Payer: BC Managed Care – PPO | Admitting: Internal Medicine

## 2021-10-23 VITALS — BP 126/82 | HR 77 | Resp 18 | Ht 65.0 in | Wt 176.4 lb

## 2021-10-23 DIAGNOSIS — E559 Vitamin D deficiency, unspecified: Secondary | ICD-10-CM

## 2021-10-23 DIAGNOSIS — Z0001 Encounter for general adult medical examination with abnormal findings: Secondary | ICD-10-CM | POA: Diagnosis not present

## 2021-10-23 DIAGNOSIS — Z8673 Personal history of transient ischemic attack (TIA), and cerebral infarction without residual deficits: Secondary | ICD-10-CM | POA: Insufficient documentation

## 2021-10-23 DIAGNOSIS — D500 Iron deficiency anemia secondary to blood loss (chronic): Secondary | ICD-10-CM | POA: Diagnosis not present

## 2021-10-23 DIAGNOSIS — I10 Essential (primary) hypertension: Secondary | ICD-10-CM

## 2021-10-23 NOTE — Progress Notes (Addendum)
Established Patient Office Visit  Subjective:  Patient ID: Margaret Owen, female    DOB: [birth year]  Age: 46 y.o. MRN: [id number]  CC:  Chief Complaint  Patient presents with   Annual Exam    Annual exam     HPI Margaret Owen is a 46 y.o. female with past medical history of HTN, TIA, IDA and HLD who presents for annual physical.  HTN: BP is well-controlled. Takes medications regularly. Patient denies headache, dizziness, chest pain, dyspnea or palpitations.  H/o TIA: No residual deficit now.  Denies any dizziness, speech or swallowing difficulty, headache, focal numbness or weakness.  She is on aspirin and statin now.  She has seen Hematology for possible protein S deficiency.  She has received iron infusions for IDA and B12 injections for B12 deficiency.  She is on oral supplements now.  Uterine fibroids: Has history of irregular and heavy menstrual cycles likely due to uterine fibroids.  Has not seen OB/GYN since 2021.    Past Medical History:  Diagnosis Date   Bell palsy    when she was a teenager   Breakthrough bleeding associated with intrauterine device (IUD) 03/22/2017   Hypertension    IUD contraception 10/05/2016   Malpositioned IUD 04/14/2017   REmoved 04/14/17    MIGRAINES, HX OF 06/11/2008   Qualifier: Diagnosis of  By: Truett Mainland MD, Christine     Pedal edema 10/05/2016    Past Surgical History:  Procedure Laterality Date   CERVICAL CERCLAGE     CESAREAN SECTION     2000,2003,2005   CESAREAN SECTION  08/17/1998   TEE WITHOUT CARDIOVERSION N/A 07/08/2021   Procedure: TRANSESOPHAGEAL ECHOCARDIOGRAM (TEE);  Surgeon: Werner Lean, MD;  Location: [facility] ENDOSCOPY;  Service: Cardiovascular;  Laterality: N/A;  being done in cath lab holding    Family History  Problem Relation Age of Onset   Heart failure Father    Alcohol abuse Father    Heart disease Father 80       died at 11   Hypertension Father    Stroke Mother    Hypertension Mother     Arthritis Mother    Asthma Mother    Diabetes Mother    Hyperlipidemia Mother    Glaucoma Mother    Breast cancer Maternal Aunt    Colon cancer Maternal Aunt    Stroke Maternal Aunt    Glaucoma Sister    Asthma Brother     Social History   Socioeconomic History   Marital status: Married    Spouse name: Harrell Gave   Number of children: 3   Years of education: 44   Highest education level: Not on file  Occupational History   Occupation: Artist    Comment: state of [place]  Tobacco Use   Smoking status: Never   Smokeless tobacco: Never  Vaping Use   Vaping Use: Never used  Substance and Sexual Activity   Alcohol use: Yes    Comment: occasional   Drug use: No   Sexual activity: Yes    Birth control/protection: Patch  Other Topics Concern   Not on file  Social History Narrative   Husband is a [place] with husband Christopher-married 68 years   Three daughters: all live at home 21, 4,15    Works for [employer], career counselor      Dog: Bandit       Enjoys: gardening, flowers      Diet: eat all food groups  Caffeine: 2 daily   Water: 2 bottles daily       Wears seat belt    Does not use phone while driving    Smoke detectors at home    Social Determinants of Health   Financial Resource Strain: Low Risk  (07/27/2019)   Overall Financial Resource Strain (CARDIA)    Difficulty of Paying Living Expenses: Not hard at all  Food Insecurity: No Food Insecurity (07/27/2019)   Hunger Vital Sign    Worried About Running Out of Food in the Last Year: Never true    Ran Out of Food in the Last Year: Never true  Transportation Needs: No Transportation Needs (07/27/2019)   PRAPARE - Hydrologist (Medical): No    Lack of Transportation (Non-Medical): No  Physical Activity: Inactive (07/27/2019)   Exercise Vital Sign    Days of Exercise per Week: 0 days    Minutes of Exercise per Session: 0 min  Stress: No Stress Concern Present  (07/27/2019)   [place]    Feeling of Stress : Only a little  Social Connections: Moderately Isolated (07/27/2019)   Social Connection and Isolation Panel [NHANES]    Frequency of Communication with Friends and Family: More than three times a week    Frequency of Social Gatherings with Friends and Family: More than three times a week    Attends Religious Services: Never    Marine scientist or Organizations: No    Attends Archivist Meetings: Never    Marital Status: Married  Human resources officer Violence: Not At Risk (07/27/2019)   Humiliation, Afraid, Rape, and Kick questionnaire    Fear of Current or Ex-Partner: No    Emotionally Abused: No    Physically Abused: No    Sexually Abused: No    Outpatient Medications Prior to Visit  Medication Sig Dispense Refill   Acetaminophen (MIDOL PO) Take 2 tablets by mouth daily as needed (cramping).     amLODipine (NORVASC) 10 MG tablet TAKE 1 TABLET(10 MG) BY MOUTH DAILY 90 tablet 1   aspirin EC 81 MG EC tablet Take 1 tablet (81 mg total) by mouth daily. Swallow whole. 30 tablet 1   ferrous sulfate 325 (65 FE) MG tablet Take 325 mg by mouth daily with breakfast.     ibuprofen (ADVIL) 200 MG tablet Take 400 mg by mouth every 6 (six) hours as needed for headache or moderate pain.     Naphazoline-Pheniramine (VISINE-A OP) Place 1 drop into both eyes daily as needed (dry eyes).     norelgestromin-ethinyl estradiol (ORTHO EVRA) 150-35 MCG/24HR transdermal patch Place 1 patch onto the skin once a week. (Patient taking differently: Place 1 patch onto the skin every Sunday.) 3 patch 11   rosuvastatin (CRESTOR) 20 MG tablet Take 1 tablet (20 mg total) by mouth daily. 90 tablet 3   Cyanocobalamin (B-12 IJ) Inject as directed once a week.     iron sucrose (VENOFER) 20 MG/ML injection Inject 100 mg into the vein once a week.     No facility-administered medications prior to visit.     No Known Allergies  ROS Review of Systems  Constitutional:  Negative for fatigue, fever and unexpected weight change.  HENT:  Negative for facial swelling, sinus pain and tinnitus.   Eyes:  Negative for pain and itching.  Respiratory:  Negative for cough and shortness of breath.   Cardiovascular:  Negative for leg  swelling.  Gastrointestinal:  Positive for constipation. Negative for diarrhea.  Endocrine: Negative for polyphagia and polyuria.  Genitourinary:  Positive for menstrual problem and vaginal bleeding. Negative for difficulty urinating, dysuria and hematuria.  Musculoskeletal:  Negative for neck pain and neck stiffness.  Skin:  Negative for rash.  Neurological:  Negative for tremors, speech difficulty, weakness, light-headedness, numbness and headaches.  Psychiatric/Behavioral:  Negative for behavioral problems.       Objective:    Physical Exam Constitutional:      Appearance: Normal appearance.  HENT:     Head: Normocephalic and atraumatic.     Nose: Nose normal.     Mouth/Throat:     Mouth: Mucous membranes are moist.  Eyes:     General: No scleral icterus.    Extraocular Movements: Extraocular movements intact.  Cardiovascular:     Rate and Rhythm: Normal rate and regular rhythm.     Pulses: Normal pulses.     Heart sounds: Normal heart sounds. No murmur heard. Pulmonary:     Breath sounds: Normal breath sounds. No wheezing or rales.  Abdominal:     Palpations: Abdomen is soft.     Tenderness: There is no abdominal tenderness.  Musculoskeletal:     Right lower leg: No edema.     Left lower leg: No edema.  Skin:    General: Skin is warm.     Findings: No rash.  Neurological:     General: No focal deficit present.     Mental Status: She is alert and oriented to person, place, and time.     Cranial Nerves: No cranial nerve deficit.     Sensory: No sensory deficit.     Motor: No weakness.  Psychiatric:        Mood and Affect: Mood normal.         Behavior: Behavior normal.     BP 126/82 (BP Location: Right Arm, Patient Position: Sitting, Cuff Size: Normal)   Pulse 77   Resp 18   Ht $R'5\' 5"'QB$  (1.651 m)   Wt 176 lb 6.4 oz (80 kg)   SpO2 98%   BMI 29.35 kg/m  Wt Readings from Last 3 Encounters:  10/23/21 176 lb 6.4 oz (80 kg)  09/04/21 169 lb 6 oz (76.8 kg)  08/14/21 169 lb 6.4 oz (76.8 kg)    Lab Results  Component Value Date   TSH 1.356 07/06/2021   Lab Results  Component Value Date   WBC 4.5 09/04/2021   HGB 11.2 (L) 09/04/2021   HCT 35.7 (L) 09/04/2021   MCV 80.8 09/04/2021   PLT 212 09/04/2021   Lab Results  Component Value Date   NA 138 09/04/2021   K 3.8 09/04/2021   CO2 28 09/04/2021   GLUCOSE 94 09/04/2021   BUN 9 09/04/2021   CREATININE 0.73 09/04/2021   BILITOT 0.3 09/04/2021   ALKPHOS 48 09/04/2021   AST 11 (L) 09/04/2021   ALT 10 09/04/2021   PROT 7.3 09/04/2021   ALBUMIN 4.0 09/04/2021   CALCIUM 9.2 09/04/2021   ANIONGAP 5 09/04/2021   Lab Results  Component Value Date   CHOL 174 07/06/2021   Lab Results  Component Value Date   HDL 42 07/06/2021   Lab Results  Component Value Date   LDLCALC 116 (H) 07/06/2021   Lab Results  Component Value Date   TRIG 79 07/06/2021   Lab Results  Component Value Date   CHOLHDL 4.1 07/06/2021   Lab Results  Component  Value Date   HGBA1C 5.6 07/06/2021      Assessment & Plan:   Problem List Items Addressed This Visit       Cardiovascular and Mediastinum   Essential hypertension    BP Readings from Last 1 Encounters:  10/23/21 126/82  Well-controlled with amlodipine Counseled for compliance with the medications Advised DASH diet and moderate exercise/walking, at least 150 mins/week         Other   Iron deficiency anemia    Likely due to chronic bleeding/menorrhagia Needs OB/GYN follow-up for uterine fibroids Takes oral iron supplements now      Relevant Orders   CBC with Differential/Platelet   H/O: CVA (cerebrovascular  accident)    On aspirin and statin now No residual deficit Followed by neurology      Relevant Orders   Lipid panel   Encounter for general adult medical examination with abnormal findings - Primary    Physical exam as documented. Counseling done  re healthy lifestyle involving commitment to 150 minutes exercise per week, heart healthy diet, and attaining healthy weight.The importance of adequate sleep also discussed. Changes in health habits are decided on by the patient with goals and time frames  set for achieving them. Immunization and cancer screening needs are specifically addressed at this visit.      Relevant Orders   TSH   Lipid panel   CMP14+EGFR   CBC with Differential/Platelet   Other Visit Diagnoses     Vitamin D deficiency       Relevant Orders   VITAMIN D 25 Hydroxy (Vit-D Deficiency, Fractures)       No orders of the defined types were placed in this encounter.   Follow-up: Return in about 6 months (around 04/25/2022) for HTN and TIA.    Lindell Spar, MD

## 2021-10-23 NOTE — Assessment & Plan Note (Signed)
On aspirin and statin now No residual deficit Followed by neurology 

## 2021-10-23 NOTE — Assessment & Plan Note (Signed)
Likely due to chronic bleeding/menorrhagia Needs OB/GYN follow-up for uterine fibroids Takes oral iron supplements now 

## 2021-10-23 NOTE — Assessment & Plan Note (Signed)
BP Readings from Last 1 Encounters:  10/23/21 126/82   Well-controlled with amlodipine Counseled for compliance with the medications Advised DASH diet and moderate exercise/walking, at least 150 mins/week

## 2021-10-23 NOTE — Assessment & Plan Note (Signed)

## 2021-10-23 NOTE — Patient Instructions (Signed)
Please continue to take medications as prescribed.  Please get fasting blood tests done within a week. 

## 2021-10-26 ENCOUNTER — Other Ambulatory Visit: Payer: Self-pay | Admitting: Advanced Practice Midwife

## 2021-10-30 ENCOUNTER — Other Ambulatory Visit: Payer: Self-pay | Admitting: Advanced Practice Midwife

## 2021-10-30 ENCOUNTER — Encounter: Payer: Self-pay | Admitting: Advanced Practice Midwife

## 2021-10-30 NOTE — Telephone Encounter (Signed)
Pt will need pap appointment for future refills

## 2021-11-01 LAB — CBC WITH DIFFERENTIAL/PLATELET
Basophils Absolute: 0 10*3/uL (ref 0.0–0.2)
Basos: 1 %
EOS (ABSOLUTE): 0.2 10*3/uL (ref 0.0–0.4)
Eos: 3 %
Hematocrit: 37.1 % (ref 34.0–46.6)
Hemoglobin: 12.2 g/dL (ref 11.1–15.9)
Immature Grans (Abs): 0 10*3/uL (ref 0.0–0.1)
Immature Granulocytes: 0 %
Lymphocytes Absolute: 1.5 10*3/uL (ref 0.7–3.1)
Lymphs: 25 %
MCH: 27 pg (ref 26.6–33.0)
MCHC: 32.9 g/dL (ref 31.5–35.7)
MCV: 82 fL (ref 79–97)
Monocytes Absolute: 0.5 10*3/uL (ref 0.1–0.9)
Monocytes: 8 %
Neutrophils Absolute: 3.8 10*3/uL (ref 1.4–7.0)
Neutrophils: 63 %
Platelets: 249 10*3/uL (ref 150–450)
RBC: 4.52 x10E6/uL (ref 3.77–5.28)
RDW: 13.7 % (ref 11.7–15.4)
WBC: 6 10*3/uL (ref 3.4–10.8)

## 2021-11-01 LAB — VITAMIN D 25 HYDROXY (VIT D DEFICIENCY, FRACTURES): Vit D, 25-Hydroxy: 15.3 ng/mL — ABNORMAL LOW (ref 30.0–100.0)

## 2021-11-01 LAB — CMP14+EGFR
ALT: 10 IU/L (ref 0–32)
AST: 9 IU/L (ref 0–40)
Albumin/Globulin Ratio: 1.5 (ref 1.2–2.2)
Albumin: 3.8 g/dL — ABNORMAL LOW (ref 3.9–4.9)
Alkaline Phosphatase: 48 IU/L (ref 44–121)
BUN/Creatinine Ratio: 15 (ref 9–23)
BUN: 9 mg/dL (ref 6–24)
Bilirubin Total: 0.2 mg/dL (ref 0.0–1.2)
CO2: 21 mmol/L (ref 20–29)
Calcium: 9.2 mg/dL (ref 8.7–10.2)
Chloride: 103 mmol/L (ref 96–106)
Creatinine, Ser: 0.6 mg/dL (ref 0.57–1.00)
Globulin, Total: 2.5 g/dL (ref 1.5–4.5)
Glucose: 85 mg/dL (ref 70–99)
Potassium: 4 mmol/L (ref 3.5–5.2)
Sodium: 136 mmol/L (ref 134–144)
Total Protein: 6.3 g/dL (ref 6.0–8.5)
eGFR: 112 mL/min/{1.73_m2} (ref >59)

## 2021-11-01 LAB — LIPID PANEL
Chol/HDL Ratio: 2.3 ratio (ref 0.0–4.4)
Cholesterol, Total: 123 mg/dL (ref 100–199)
HDL: 53 mg/dL (ref >39)
LDL Chol Calc (NIH): 56 mg/dL (ref 0–99)
Triglycerides: 65 mg/dL (ref 0–149)
VLDL Cholesterol Cal: 14 mg/dL (ref 5–40)

## 2021-11-01 LAB — TSH: TSH: 0.851 u[IU]/mL (ref 0.450–4.500)

## 2021-11-28 ENCOUNTER — Other Ambulatory Visit: Payer: Self-pay | Admitting: Advanced Practice Midwife

## 2021-12-03 ENCOUNTER — Other Ambulatory Visit: Payer: Self-pay

## 2021-12-03 ENCOUNTER — Inpatient Hospital Stay: Payer: BC Managed Care – PPO | Attending: Physician Assistant

## 2021-12-03 ENCOUNTER — Encounter: Payer: Self-pay | Admitting: Internal Medicine

## 2021-12-03 ENCOUNTER — Inpatient Hospital Stay (HOSPITAL_BASED_OUTPATIENT_CLINIC_OR_DEPARTMENT_OTHER): Payer: BC Managed Care – PPO | Admitting: Internal Medicine

## 2021-12-03 VITALS — BP 125/86 | HR 84 | Resp 18 | Wt 175.0 lb

## 2021-12-03 DIAGNOSIS — D500 Iron deficiency anemia secondary to blood loss (chronic): Secondary | ICD-10-CM | POA: Insufficient documentation

## 2021-12-03 DIAGNOSIS — E538 Deficiency of other specified B group vitamins: Secondary | ICD-10-CM | POA: Diagnosis not present

## 2021-12-03 DIAGNOSIS — D6859 Other primary thrombophilia: Secondary | ICD-10-CM | POA: Diagnosis not present

## 2021-12-03 DIAGNOSIS — D509 Iron deficiency anemia, unspecified: Secondary | ICD-10-CM

## 2021-12-03 LAB — CBC WITH DIFFERENTIAL (CANCER CENTER ONLY)
Abs Immature Granulocytes: 0.03 10*3/uL (ref 0.00–0.07)
Basophils Absolute: 0 10*3/uL (ref 0.0–0.1)
Basophils Relative: 0 %
Eosinophils Absolute: 0.2 10*3/uL (ref 0.0–0.5)
Eosinophils Relative: 2 %
HCT: 36.5 % (ref 36.0–46.0)
Hemoglobin: 12.2 g/dL (ref 12.0–15.0)
Immature Granulocytes: 0 %
Lymphocytes Relative: 30 %
Lymphs Abs: 2 10*3/uL (ref 0.7–4.0)
MCH: 27.7 pg (ref 26.0–34.0)
MCHC: 33.4 g/dL (ref 30.0–36.0)
MCV: 83 fL (ref 80.0–100.0)
Monocytes Absolute: 0.4 10*3/uL (ref 0.1–1.0)
Monocytes Relative: 5 %
Neutro Abs: 4.1 10*3/uL (ref 1.7–7.7)
Neutrophils Relative %: 63 %
Platelet Count: 255 10*3/uL (ref 150–400)
RBC: 4.4 MIL/uL (ref 3.87–5.11)
RDW: 13.2 % (ref 11.5–15.5)
WBC Count: 6.7 10*3/uL (ref 4.0–10.5)
nRBC: 0 % (ref 0.0–0.2)

## 2021-12-03 LAB — IRON AND IRON BINDING CAPACITY (CC-WL,HP ONLY)
Iron: 56 ug/dL (ref 28–170)
Saturation Ratios: 13 % (ref 10.4–31.8)
TIBC: 421 ug/dL (ref 250–450)
UIBC: 365 ug/dL

## 2021-12-03 LAB — FOLATE: Folate: 14.1 ng/mL (ref >5.9)

## 2021-12-03 LAB — VITAMIN B12: Vitamin B-12: 1002 pg/mL — ABNORMAL HIGH (ref 180–914)

## 2021-12-03 NOTE — Progress Notes (Signed)
[place] Telephone:([phone]) [phone]   Fax:([phone]) [phone]  OFFICE PROGRESS NOTE  Lindell Spar, MD [address] [place] [address]  DIAGNOSIS:  1) history of iron deficiency anemia secondary to menorrhagia and vitamin B12 deficiency. 2) nonspecific acquired protein S deficiency  PRIOR THERAPY:  1) Iron infusion with Venofer 300 mg IV weekly for 3 weeks. 2) Monthly vitamin B12 injection 1000 mcg intramuscular x4.  CURRENT THERAPY: Ferrous sulfate 325 mg p.o. daily in addition to vitamin B12 oral supplement daily.  INTERVAL HISTORY: Margaret Owen 46 y.o. female returns to the clinic today for follow-up visit.  The patient has no complaints today.  She denied having any current chest pain, shortness of breath, cough or hemoptysis.  She has no swelling of the lower extremities.  She has no nausea, vomiting, diarrhea or constipation.  She has no bleeding, bruises or ecchymosis.  She has no headache or visual changes.  She continues to tolerate her oral iron tablets as well as vitamin B12 supplement fairly well.  She is here today for evaluation and repeat blood work.  MEDICAL HISTORY: Past Medical History:  Diagnosis Date   Bell palsy    when she was a teenager   Breakthrough bleeding associated with intrauterine device (IUD) 03/22/2017   Hypertension    IUD contraception 10/05/2016   Malpositioned IUD 04/14/2017   REmoved 04/14/17    MIGRAINES, HX OF 06/11/2008   Qualifier: Diagnosis of  By: Truett Mainland MD, Christine     Pedal edema 10/05/2016    ALLERGIES:  has No Known Allergies.  MEDICATIONS:  Current Outpatient Medications  Medication Sig Dispense Refill   Acetaminophen (MIDOL PO) Take 2 tablets by mouth daily as needed (cramping).     amLODipine (NORVASC) 10 MG tablet TAKE 1 TABLET(10 MG) BY MOUTH DAILY 90 tablet 1   aspirin EC 81 MG EC tablet Take 1 tablet (81 mg total) by mouth daily. Swallow whole. 30 tablet 1   ferrous sulfate 325 (65 FE) MG  tablet Take 325 mg by mouth daily with breakfast.     ibuprofen (ADVIL) 200 MG tablet Take 400 mg by mouth every 6 (six) hours as needed for headache or moderate pain.     Naphazoline-Pheniramine (VISINE-A OP) Place 1 drop into both eyes daily as needed (dry eyes).     rosuvastatin (CRESTOR) 20 MG tablet Take 1 tablet (20 mg total) by mouth daily. 90 tablet 3   ZAFEMY 150-35 MCG/24HR transdermal patch APPLY 1 PATCH TOPICALLY TO THE SKIN 1 TIME A WEEK 3 patch 1   No current facility-administered medications for this visit.    SURGICAL HISTORY:  Past Surgical History:  Procedure Laterality Date   CERVICAL CERCLAGE     CESAREAN SECTION     [id number]   CESAREAN SECTION  08/17/1998   TEE WITHOUT CARDIOVERSION N/A 07/08/2021   Procedure: TRANSESOPHAGEAL ECHOCARDIOGRAM (TEE);  Surgeon: Werner Lean, MD;  Location: [facility] ENDOSCOPY;  Service: Cardiovascular;  Laterality: N/A;  being done in cath lab holding    REVIEW OF SYSTEMS:  A comprehensive review of systems was negative.   PHYSICAL EXAMINATION: General appearance: alert, cooperative, and no distress Head: Normocephalic, without obvious abnormality, atraumatic Neck: no adenopathy, no JVD, supple, symmetrical, trachea midline, and thyroid not enlarged, symmetric, no tenderness/mass/nodules Lymph nodes: Cervical, supraclavicular, and axillary nodes normal. Resp: clear to auscultation bilaterally Back: symmetric, no curvature. ROM normal. No CVA tenderness. Cardio: regular rate and rhythm, S1, S2  normal, no murmur, click, rub or gallop GI: soft, non-tender; bowel sounds normal; no masses,  no organomegaly Extremities: extremities normal, atraumatic, no cyanosis or edema  ECOG PERFORMANCE STATUS: 0 - Asymptomatic  Blood pressure 125/86, pulse 84, resp. rate 18, weight 175 lb (79.4 kg), SpO2 100 %.  LABORATORY DATA: Lab Results  Component Value Date   WBC 6.7 12/03/2021   HGB 12.2 12/03/2021   HCT 36.5 12/03/2021   MCV  83.0 12/03/2021   PLT 255 12/03/2021      Chemistry      Component Value Date/Time   NA 136 10/31/2021 0809   K 4.0 10/31/2021 0809   CL 103 10/31/2021 0809   CO2 21 10/31/2021 0809   BUN 9 10/31/2021 0809   CREATININE 0.60 10/31/2021 0809   CREATININE 0.73 09/04/2021 0901   CREATININE 0.61 11/14/2019 0733      Component Value Date/Time   CALCIUM 9.2 10/31/2021 0809   ALKPHOS 48 10/31/2021 0809   AST 9 10/31/2021 0809   AST 11 (L) 09/04/2021 0901   ALT 10 10/31/2021 0809   ALT 10 09/04/2021 0901   BILITOT 0.2 10/31/2021 0809   BILITOT 0.3 09/04/2021 0901       RADIOGRAPHIC STUDIES: No results found.  ASSESSMENT AND PLAN: This is a very pleasant 46 years old African-American female with history of iron deficiency anemia secondary to menorrhagia in addition to vitamin B12 deficiency and suspicious protein S deficiency that significantly improved on repeat protein S panel. The patient was treated with Venofer 300 mg IV weekly for 3 weeks in addition to vitamin B12 injection 1000 mcg intramuscular for 4 doses. The patient is currently on oral iron and vitamin B12 supplement and she is tolerating it fairly well. Repeat CBC today is unremarkable for any anemia.  Iron study, ferritin, vitamin B12 and serum folate are still pending. I recommended for the patient to continue with her current treatment with oral supplements for now. She will follow-up with her primary care physician from now on and I will be happy to see her in the future on as-needed basis. The patient is in agreement with the current plan. She was advised to call immediately if she has any other concerning symptoms in the interval. The patient voices understanding of current disease status and treatment options and is in agreement with the current care plan.  All questions were answered. The patient knows to call the clinic with any problems, questions or concerns. We can certainly see the patient much sooner if  necessary.  The total time spent in the appointment was 25 minutes.  Disclaimer: This note was dictated with voice recognition software. Similar sounding words can inadvertently be transcribed and may not be corrected upon review.

## 2021-12-04 LAB — FERRITIN: Ferritin: 14 ng/mL (ref 11–307)

## 2022-02-07 ENCOUNTER — Other Ambulatory Visit: Payer: Self-pay | Admitting: Internal Medicine

## 2022-02-07 DIAGNOSIS — I10 Essential (primary) hypertension: Secondary | ICD-10-CM

## 2022-02-08 ENCOUNTER — Emergency Department (HOSPITAL_COMMUNITY)
Admission: EM | Admit: 2022-02-08 | Discharge: 2022-02-08 | Disposition: A | Discharge status: Home / Self Care | Payer: BC Managed Care – PPO | Attending: Emergency Medicine | Admitting: Emergency Medicine

## 2022-02-08 ENCOUNTER — Other Ambulatory Visit: Payer: Self-pay

## 2022-02-08 ENCOUNTER — Encounter (HOSPITAL_COMMUNITY): Payer: Self-pay | Admitting: *Deleted

## 2022-02-08 ENCOUNTER — Emergency Department (HOSPITAL_COMMUNITY): Payer: BC Managed Care – PPO

## 2022-02-08 DIAGNOSIS — X500XXA Overexertion from strenuous movement or load, initial encounter: Secondary | ICD-10-CM | POA: Insufficient documentation

## 2022-02-08 DIAGNOSIS — S99922A Unspecified injury of left foot, initial encounter: Secondary | ICD-10-CM | POA: Insufficient documentation

## 2022-02-08 HISTORY — DX: Transient cerebral ischemic attack, unspecified: G45.9

## 2022-02-08 NOTE — ED Notes (Signed)
Patient verbalizes understanding of discharge instructions. Opportunity for questioning and answers were provided. Armband removed by staff, pt discharged from ED via wheelchair.  

## 2022-02-08 NOTE — Discharge Instructions (Signed)
Ibuprofen and Tylenol may be used for your discomfort.  This will likely resolve over the next week.  Keep your leg elevated and ice the area as well.  Return with any decreased ability to move your foot or any loss of sensation.  Otherwise you may see your PCP.  It was a pleasure to meet you and we hope you feel better!

## 2022-02-08 NOTE — ED Triage Notes (Signed)
Pt says that she was feeling some tightness in her foot this afternoon and noticed the top of her left foot was swollen and feels sore. Cannot recall a specific injury.

## 2022-02-08 NOTE — ED Provider Notes (Signed)
[facility] EMERGENCY DEPARTMENT Provider Note   CSN: [id number] Arrival date & time: 02/08/22  1834     History  Chief Complaint  Patient presents with   Foot Swelling    Margaret Owen is a 46 y.o. female presenting with pain to the left foot.  Reports that this afternoon she thinks she may have stepped awkwardly and then started to notice swelling and tenderness to the dorsal surface of her left foot.  Reports wanting to get checked out because she has a history of TIA and wants to make sure this is not a blood clot.  HPI     Home Medications Prior to Admission medications   Medication Sig Start Date End Date Taking? Authorizing Provider  Acetaminophen (MIDOL PO) Take 2 tablets by mouth daily as needed (cramping).    [provider]  amLODipine (NORVASC) 10 MG tablet TAKE 1 TABLET(10 MG) BY MOUTH DAILY 07/30/21   Lindell Spar, MD  aspirin EC 81 MG EC tablet Take 1 tablet (81 mg total) by mouth daily. Swallow whole. 07/09/21   Darliss Cheney, MD  ferrous sulfate 325 (65 FE) MG tablet Take 325 mg by mouth daily with breakfast.    [provider]  ibuprofen (ADVIL) 200 MG tablet Take 400 mg by mouth every 6 (six) hours as needed for headache or moderate pain.    [provider]  Naphazoline-Pheniramine (VISINE-A OP) Place 1 drop into both eyes daily as needed (dry eyes).    [provider]  rosuvastatin (CRESTOR) 20 MG tablet Take 1 tablet (20 mg total) by mouth daily. 08/06/21   Lindell Spar, MD  ZAFEMY 150-35 MCG/24HR transdermal patch APPLY 1 PATCH TOPICALLY TO THE SKIN 1 TIME A WEEK 12/04/21   Cresenzo-Dishmon, Joaquim Lai, CNM      Allergies    Patient has no known allergies.    Review of Systems   Review of Systems  Physical Exam Updated Vital Signs BP (!) 147/98 (BP Location: Right Arm)   Pulse 77   Temp 99.3 F (37.4 C) (Oral)   Resp 17   SpO2 98%  Physical Exam Vitals and nursing note reviewed.   Constitutional:      Appearance: Normal appearance.  HENT:     Head: Normocephalic and atraumatic.  Eyes:     General: No scleral icterus.    Conjunctiva/sclera: Conjunctivae normal.  Pulmonary:     Effort: Pulmonary effort is normal. No respiratory distress.  Musculoskeletal:     Comments: Full range of motion of the left ankle and left MTPs.  Sensation intact proximally and distally.  Normal range of motion and strength with dorsiflexion of the ankle.  Strong DP pulse.  Mild swelling over the first and second metatarsals  Skin:    Findings: No rash.  Neurological:     Mental Status: She is alert.  Psychiatric:        Mood and Affect: Mood normal.     ED Results / Procedures / Treatments   Labs (all labs ordered are listed, but only abnormal results are displayed) Labs Reviewed - No data to display  EKG None  Radiology DG Foot Complete Left  Result Date: 02/08/2022 CLINICAL DATA:  foot swelling EXAM: LEFT FOOT - COMPLETE 3+ VIEW COMPARISON:  None Available. FINDINGS: No cortical erosion or destruction. There is no evidence of fracture or dislocation. There is no evidence of arthropathy or other focal bone abnormality. Dorsal subcutaneus soft tissue edema. IMPRESSION: 1. No  radiographic finding of osteomyelitis. 2.  No acute displaced fracture or dislocation. Electronically Signed   By: Iven Finn M.D.   On: 02/08/2022 19:29    Procedures Procedures   Medications Ordered in ED Medications - No data to display  ED Course/ Medical Decision Making/ A&P                           Medical Decision Making Amount and/or Complexity of Data Reviewed Radiology: ordered.   46 year old female presenting today with left foot swelling.  Denies any injury but believes she may have stepped abnormally earlier this afternoon.  Physical exam: Some tenderness and swelling over the dorsal surface of the left foot.  Neurovascularly intact.  Imaging: Ordered in triage.  I viewed  and interpreted this and agree with radiology that there are no signs of fracture or other acute injury.  Medications: Declined Tylenol or ibuprofen  MDM/disposition: Neurovascularly intact.  Normal range of motion.  No signs of tendon injury.  At this time patient does not appear to have an emergent condition requiring further evaluation.  She will be given an ankle brace for support and encouraged to use NSAIDs and Tylenol for discomfort.  Ice is also a good option.  There are no signs of DVT on her exam and she has been reassured.  Discharged in ambulatory condition   Final Clinical Impression(s) / ED Diagnoses Final diagnoses:  Foot injury, left, initial encounter    Rx / DC Orders ED Discharge Orders     None      Results and diagnoses were explained to the patient. Return precautions discussed in full. Patient had no additional questions and expressed complete understanding.   This chart was dictated using voice recognition software.  Despite best efforts to proofread,  errors can occur which can change the documentation meaning.    Darliss Ridgel 02/08/22 Steva Colder, MD 02/08/22 2034

## 2022-02-10 NOTE — Progress Notes (Signed)
[facility] [address] [place]. [place] [address] [phone]       HOSPITAL FOLLOW UP NOTE  Ms. Margaret Owen Date of Birth:  Jun 28, 1975 Medical Record Number:  [id number]   Reason for Referral:  hospital stroke follow up    SUBJECTIVE:   CHIEF COMPLAINT:  Chief Complaint  Patient presents with   Follow-up    Pt in room #2 and alone. Pt here today for f/u on a stroke.    HPI:   BABY Margaret Owen is a 46 y.o. who  has a past medical history of Bell palsy, Breakthrough bleeding associated with intrauterine device (IUD) (03/22/2017), Hypertension, IUD contraception (10/05/2016), Malpositioned IUD (04/14/2017), MIGRAINES, HX OF (06/11/2008), Pedal edema (10/05/2016), and TIA (transient ischemic attack).  Patient presented on 07/05/2021 with cute onset left upper extremity numbness, weakness, left hand paresthesias, mild slurred speech and facial twitching. She reports having a headache associated with hunger earlier that day. Headache had resolved with eating and prior to symptom onset. CT head negative. 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 TTE normal EF. TEE positive left to right shunt with possible PFO. Cardiology consulted for closure outpatient. Neurology recommended DAPT with asa and Plavix. She was discharged home 07/08/2021. Personally reviewed hospitalization pertinent progress notes, lab work and imaging.  Evaluated by Dr Erlinda Hong.   Since discharge, she is doing well. No deficits noted. She is back to work. She is followed by PCP.   Dr Burt Knack reviewed TEE with Dr Gasper Sells and did not feel patient had PFO. Appt was cancelled. Dr Erlinda Hong referred her to hematology for low protein S activity. She was seen 07/24/2021. Labs repeated and iron transfusions started. She is getting B12 injections with PCP. She is using Ortho Evra for contraception and dysmenorrhea. She has  continued asa '81mg'$  and Plavix '75mg'$  daily. No obvious adverse effects. She is also taking rosuvastatin and tolerating well.   She does have a history of migraines. She denies history of seizures. Mom has seizures thought to be related to damage from previous stroke. She does not smoke. No regular alcohol use.   UPDATE 02/16/2022 ALL: Tinleigh returns for follow up following CVA. She is doing well. No stroke like symptoms. No deficits.   She is followed by Dr Julien Nordmann. She reports feeling better energized and is sleeping better following iron transfusion and B12 injections. She continues oral ferrous sulfate. She is looking for a new gynecologist for management of uterine fibroids. Her provider retired.   She did discontinue Plavix. She continues asa '81mg'$  and rosuvastatin. LDL in 10/2021 was 56. She remains active. BP is well managed.    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    A1C Lab Results  Component Value Date   HGBA1C 5.6 07/06/2021    Lipid Panel     Component Value Date/Time   CHOL 123 10/31/2021 0809   TRIG 65 10/31/2021 0809   HDL 53 10/31/2021 0809   CHOLHDL 2.3 10/31/2021 0809   CHOLHDL 4.1 07/06/2021 0623   VLDL 16 07/06/2021 0623   LDLCALC 56 10/31/2021 0809   LDLCALC 115 (H)  11/14/2019 0733   LABVLDL 14 10/31/2021 0809    ROS:   14 system review of systems performed and negative with exception of those listed in HPI  PMH:  Past Medical History:  Diagnosis Date   Bell palsy    when she was a teenager   Breakthrough bleeding associated with intrauterine device (IUD) 03/22/2017   Hypertension    IUD contraception 10/05/2016   Malpositioned IUD 04/14/2017    REmoved 04/14/17    MIGRAINES, HX OF 06/11/2008   Qualifier: Diagnosis of  By: Truett Mainland MD, Christine     Pedal edema 10/05/2016   TIA (transient ischemic attack)     PSH:  Past Surgical History:  Procedure Laterality Date   CERVICAL CERCLAGE     CESAREAN SECTION     2000,2003,2005   CESAREAN SECTION  08/17/1998   TEE WITHOUT CARDIOVERSION N/A 07/08/2021   Procedure: TRANSESOPHAGEAL ECHOCARDIOGRAM (TEE);  Surgeon: Werner Lean, MD;  Location: [facility] ENDOSCOPY;  Service: Cardiovascular;  Laterality: N/A;  being done in cath lab holding    Social History:  Social History   Socioeconomic History   Marital status: Married    Spouse name: Harrell Gave   Number of children: 3   Years of education: 16   Highest education level: Not on file  Occupational History   Occupation: Artist    Comment: state of [place]  Tobacco Use   Smoking status: Never   Smokeless tobacco: Never  Vaping Use   Vaping Use: Never used  Substance and Sexual Activity   Alcohol use: Yes    Comment: occasional   Drug use: No   Sexual activity: Yes    Birth control/protection: Patch  Other Topics Concern   Not on file  Social History Narrative   Husband is a [place] with husband Christopher-married 63 years   Three daughters: all live at home 6, 33,15    Works for [employer], Art gallery manager      Dog: Hydrographic surveyor       Enjoys: gardening, flowers      Diet: eat all food groups   Caffeine: 2 daily   Water: 2 bottles daily       Wears seat belt    Does not use phone while driving    Oceanographer at home    Social Determinants of Health   Financial Resource Strain: [place]  (07/27/2019)   Overall Financial Resource Strain (CARDIA)    Difficulty of Paying Living Expenses: Not hard at all  Food Insecurity: No Food Insecurity (07/27/2019)   Hunger Vital Sign    Worried About Running Out of Food in the Last Year: Never true    [place] in the Last Year: Never true   Transportation Needs: No Transportation Needs (07/27/2019)   PRAPARE - Hydrologist (Medical): No    Lack of Transportation (Non-Medical): No  Physical Activity: Inactive (07/27/2019)   Exercise Vital Sign    Days of Exercise per Week: 0 days    Minutes of Exercise per Session: 0 min  Stress: No Stress Concern Present (07/27/2019)   [place]    Feeling of Stress : Only a little  Social Connections: Moderately Isolated (07/27/2019)   Social Connection and Isolation Panel [NHANES]    Frequency of Communication with Friends and Family: More than three times a week    Frequency of Social Gatherings with Friends  and Family: More than three times a week    Attends Religious Services: Never    Active Member of Clubs or Organizations: No    Attends Archivist Meetings: Never    Marital Status: Married  Human resources officer Violence: Not At Risk (07/27/2019)   Humiliation, Afraid, Rape, and Kick questionnaire    Fear of Current or Ex-Partner: No    Emotionally Abused: No    Physically Abused: No    Sexually Abused: No    Family History:  Family History  Problem Relation Age of Onset   Heart failure Father    Alcohol abuse Father    Heart disease Father 46       died at 40   Hypertension Father    Stroke Mother    Hypertension Mother    Arthritis Mother    Asthma Mother    Diabetes Mother    Hyperlipidemia Mother    Glaucoma Mother    Breast cancer Maternal Aunt    Colon cancer Maternal Aunt    Stroke Maternal Aunt    Glaucoma Sister    Asthma Brother     Medications:   Current Outpatient Medications on File Prior to Visit  Medication Sig Dispense Refill   Acetaminophen (MIDOL PO) Take 2 tablets by mouth daily as needed (cramping).     amLODipine (NORVASC) 10 MG tablet TAKE 1 TABLET(10 MG) BY MOUTH DAILY 90 tablet 1   aspirin EC 81 MG EC tablet Take 1 tablet (81 mg total) by mouth  daily. Swallow whole. 30 tablet 1   ferrous sulfate 325 (65 FE) MG tablet Take 325 mg by mouth daily with breakfast.     ibuprofen (ADVIL) 200 MG tablet Take 400 mg by mouth every 6 (six) hours as needed for headache or moderate pain.     Naphazoline-Pheniramine (VISINE-A OP) Place 1 drop into both eyes daily as needed (dry eyes).     rosuvastatin (CRESTOR) 20 MG tablet Take 1 tablet (20 mg total) by mouth daily. 90 tablet 3   ZAFEMY 150-35 MCG/24HR transdermal patch APPLY 1 PATCH TOPICALLY TO THE SKIN 1 TIME A WEEK 12 patch 3   No current facility-administered medications on file prior to visit.    Allergies:  No Known Allergies   OBJECTIVE:  Physical Exam  Vitals:   02/16/22 1437  BP: 122/82  Pulse: 78  Weight: 178 lb (80.7 kg)  Height: '5\' 1"'$  (1.549 m)    Body mass index is 33.63 kg/m. No results found.     10/23/2021    4:10 PM  Depression screen PHQ 2/9  Decreased Interest 0  Down, Depressed, Hopeless 0  PHQ - 2 Score 0     General: well developed, well nourished, seated, in no evident distress Head: head normocephalic and atraumatic.   Neck: supple with no carotid or supraclavicular bruits Cardiovascular: regular rate and rhythm, no murmurs Musculoskeletal: no deformity Skin:  no rash/petichiae Vascular:  Normal pulses all extremities   Neurologic Exam Mental Status: Awake and fully alert.  Fluent speech and language.  Oriented to place and time. Recent and remote memory intact. Attention span, concentration and fund of knowledge appropriate. Mood and affect appropriate.  Cranial Nerves: Fundoscopic exam reveals sharp disc margins. Pupils equal, briskly reactive to light. Extraocular movements full without nystagmus. Visual fields full to confrontation. Hearing intact. Facial sensation intact. Face, tongue, palate moves normally and symmetrically.  Motor: Normal bulk and tone. Normal strength in all tested extremity muscles Gait  and Station: Arises from chair  without difficulty. Stance is normal. Gait demonstrates normal stride length and balance without assistive device.     NIHSS  0 Modified Rankin  0    ASSESSMENT: Margaret Owen is a 46 y.o. year old female who presented on 07/05/2021 with cute onset left upper extremity numbness, weakness, left hand paresthesias, mild slurred speech and facial twitching. Vascular risk factors include HTN, HLD, possible protein S deficiency.    PLAN:  Stroke: right MCA scattered small/punctate infarct embolic secondary to unclear source : Residual deficit: none . Continue aspirin 81 mg daily and clopidogrel 75 mg daily  and rosuvastatin '20mg'$  daily  for secondary stroke prevention.  Discussed secondary stroke prevention measures and importance of close PCP follow up for aggressive stroke risk factor management. I have gone over the pathophysiology of stroke, warning signs and symptoms, risk factors and their management in some detail with instructions to go to the closest emergency room for symptoms of concern. HTN: BP goal <130/90.  Stable on amlodipine '10mg'$  daily. Continue  per PCP HLD: LDL goal <70. Recent LDL 56. Continue rosuvastatin '20mg'$  daily per PCP.  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    Follow up as needed   CC:  [facility] provider: Dr. Leonie Man PCP: Lindell Spar, MD    I spent 30 minutes of face-to-face and non-face-to-face time with patient.  This included previsit chart review including review of recent hospitalization, lab review, study review, order entry, electronic health record documentation, patient education regarding recent stroke including etiology, secondary stroke prevention measures and importance of managing stroke risk factors, residual deficits and typical recovery time and answered all other questions to patient satisfaction   Debbora Presto, [facility]  [facility] [address]  [place], [place] [address]  Phone [phone] Fax [phone] Note: This document was prepared with digital dictation and possible smart phrase technology. Any transcriptional errors that result from this process are unintentional.

## 2022-02-10 NOTE — Patient Instructions (Signed)
Below is our plan:  We will continue to monitor. Continue close follow up with PCP, hematology and gynecology.   Please make sure you are staying well hydrated. I recommend 50-60 ounces daily. Well balanced diet and regular exercise encouraged. Consistent sleep schedule with 6-8 hours recommended.   Please continue follow up with care team as directed.   Follow up with me as needed.   You may receive a survey regarding today's visit. I encourage you to leave honest feed back as I do use this information to improve patient care. Thank you for seeing me today!

## 2022-02-16 ENCOUNTER — Ambulatory Visit: Payer: BC Managed Care – PPO | Admitting: Family Medicine

## 2022-02-16 ENCOUNTER — Encounter: Payer: Self-pay | Admitting: Family Medicine

## 2022-02-16 VITALS — BP 122/82 | HR 78 | Ht 61.0 in | Wt 178.0 lb

## 2022-02-16 DIAGNOSIS — I639 Cerebral infarction, unspecified: Secondary | ICD-10-CM

## 2022-02-20 ENCOUNTER — Ambulatory Visit: Payer: BC Managed Care – PPO | Admitting: Internal Medicine

## 2022-02-20 ENCOUNTER — Encounter: Payer: Self-pay | Admitting: Internal Medicine

## 2022-02-20 VITALS — BP 130/78 | HR 81 | Resp 16 | Ht 64.0 in | Wt 180.2 lb

## 2022-02-20 DIAGNOSIS — S9032XA Contusion of left foot, initial encounter: Secondary | ICD-10-CM

## 2022-02-20 DIAGNOSIS — Z2821 Immunization not carried out because of patient refusal: Secondary | ICD-10-CM | POA: Diagnosis not present

## 2022-02-20 DIAGNOSIS — Z09 Encounter for follow-up examination after completed treatment for conditions other than malignant neoplasm: Secondary | ICD-10-CM | POA: Diagnosis not present

## 2022-02-20 NOTE — Progress Notes (Signed)
Established Patient Office Visit  Subjective:  Patient ID: Margaret Owen, female    DOB: [birth year]  Age: 46 y.o. MRN: [id number]  CC:  Chief Complaint  Patient presents with   Foot Swelling    Pt having left foot swelling and knot since 10/22 went to ER was told hematoma but now is hard and hurting did not hurt before and feet have been swelling     HPI Margaret Owen is a 46 y.o. female with past medical history of HTN, TIA, IDA and HLD who presents for f/u of recent ER visit for left foot pain and swelling.  She denies any known injury, but reports that she might have twisted her ankle at home.  She went to ER on 10/22 and had x-ray of foot, which did not show any acute pathology.  Her left foot swelling has mildly improved, but has noticed a persistent bump on the dorsal aspect of left foot with mild pain.  She is concerned due to her history of TIA.  She was told about left foot hematoma in the ER.  She tried ice for the first 2 days, but has not tried it since.  She is currently on aspirin for history of TIA.  Denies any numbness or tingling of the foot.    Past Medical History:  Diagnosis Date   Bell palsy    when she was a teenager   Breakthrough bleeding associated with intrauterine device (IUD) 03/22/2017   Hypertension    IUD contraception 10/05/2016   Malpositioned IUD 04/14/2017   REmoved 04/14/17    MIGRAINES, HX OF 06/11/2008   Qualifier: Diagnosis of  By: Truett Mainland MD, Christine     Pedal edema 10/05/2016   TIA (transient ischemic attack)     Past Surgical History:  Procedure Laterality Date   CERVICAL CERCLAGE     CESAREAN SECTION     2000,2003,2005   CESAREAN SECTION  08/17/1998   TEE WITHOUT CARDIOVERSION N/A 07/08/2021   Procedure: TRANSESOPHAGEAL ECHOCARDIOGRAM (TEE);  Surgeon: Werner Lean, MD;  Location: [facility] ENDOSCOPY;  Service: Cardiovascular;  Laterality: N/A;  being done in cath lab holding    Family History  Problem Relation Age of  Onset   Heart failure Father    Alcohol abuse Father    Heart disease Father 57       died at 17   Hypertension Father    Stroke Mother    Hypertension Mother    Arthritis Mother    Asthma Mother    Diabetes Mother    Hyperlipidemia Mother    Glaucoma Mother    Breast cancer Maternal Aunt    Colon cancer Maternal Aunt    Stroke Maternal Aunt    Glaucoma Sister    Asthma Brother     Social History   Socioeconomic History   Marital status: Married    Spouse name: Harrell Gave   Number of children: 3   Years of education: 82   Highest education level: Not on file  Occupational History   Occupation: Artist    Comment: state of [place]  Tobacco Use   Smoking status: Never   Smokeless tobacco: Never  Vaping Use   Vaping Use: Never used  Substance and Sexual Activity   Alcohol use: Yes    Comment: occasional   Drug use: No   Sexual activity: Yes    Birth control/protection: Patch  Other Topics Concern   Not on file  Social History Narrative  Husband is a [place] with husband Christopher-married 50 years   Three daughters: all live at home 63, 35,15    Works for [employer], Art gallery manager      Dog: Bandit       Enjoys: gardening, flowers      Diet: eat all food groups   Caffeine: 2 daily   Water: 2 bottles daily       Wears seat belt    Does not use phone while driving    Oceanographer at home    Social Determinants of Health   Financial Resource Strain: [place]  (07/27/2019)   Overall Financial Resource Strain (CARDIA)    Difficulty of Paying Living Expenses: Not hard at all  Food Insecurity: No Food Insecurity (07/27/2019)   Hunger Vital Sign    Worried About Running Out of Food in the Last Year: Never true    [place] in the Last Year: Never true  Transportation Needs: No Transportation Needs (07/27/2019)   PRAPARE - Hydrologist (Medical): No    Lack of Transportation (Non-Medical): No  Physical  Activity: Inactive (07/27/2019)   Exercise Vital Sign    Days of Exercise per Week: 0 days    Minutes of Exercise per Session: 0 min  Stress: No Stress Concern Present (07/27/2019)   [place]    Feeling of Stress : Only a little  Social Connections: Moderately Isolated (07/27/2019)   Social Connection and Isolation Panel [NHANES]    Frequency of Communication with Friends and Family: More than three times a week    Frequency of Social Gatherings with Friends and Family: More than three times a week    Attends Religious Services: Never    Marine scientist or Organizations: No    Attends Archivist Meetings: Never    Marital Status: Married  Human resources officer Violence: Not At Risk (07/27/2019)   Humiliation, Afraid, Rape, and Kick questionnaire    Fear of Current or Ex-Partner: No    Emotionally Abused: No    Physically Abused: No    Sexually Abused: No    Outpatient Medications Prior to Visit  Medication Sig Dispense Refill   Acetaminophen (MIDOL PO) Take 2 tablets by mouth daily as needed (cramping).     amLODipine (NORVASC) 10 MG tablet TAKE 1 TABLET(10 MG) BY MOUTH DAILY 90 tablet 1   aspirin EC 81 MG EC tablet Take 1 tablet (81 mg total) by mouth daily. Swallow whole. 30 tablet 1   ferrous sulfate 325 (65 FE) MG tablet Take 325 mg by mouth daily with breakfast.     Naphazoline-Pheniramine (VISINE-A OP) Place 1 drop into both eyes daily as needed (dry eyes).     rosuvastatin (CRESTOR) 20 MG tablet Take 1 tablet (20 mg total) by mouth daily. 90 tablet 3   ZAFEMY 150-35 MCG/24HR transdermal patch APPLY 1 PATCH TOPICALLY TO THE SKIN 1 TIME A WEEK 12 patch 3   ibuprofen (ADVIL) 200 MG tablet Take 400 mg by mouth every 6 (six) hours as needed for headache or moderate pain.     No facility-administered medications prior to visit.    No Known Allergies  ROS Review of Systems  Constitutional:  Negative for  fatigue, fever and unexpected weight change.  HENT:  Negative for facial swelling, sinus pain and tinnitus.   Eyes:  Negative for pain and itching.  Respiratory:  Negative for cough and shortness of breath.   Cardiovascular:  Negative for chest pain and palpitations.  Gastrointestinal:  Positive for constipation. Negative for diarrhea.  Endocrine: Negative for polyphagia and polyuria.  Genitourinary:  Positive for menstrual problem and vaginal bleeding. Negative for difficulty urinating, dysuria and hematuria.  Musculoskeletal:  Negative for neck pain and neck stiffness.       Left foot swelling  Skin:  Negative for rash.  Neurological:  Negative for tremors, speech difficulty, weakness, light-headedness, numbness and headaches.  Psychiatric/Behavioral:  Negative for behavioral problems.       Objective:    Physical Exam Constitutional:      Appearance: Normal appearance.  HENT:     Head: Normocephalic and atraumatic.     Mouth/Throat:     Mouth: Mucous membranes are moist.  Eyes:     General: No scleral icterus.    Extraocular Movements: Extraocular movements intact.  Cardiovascular:     Rate and Rhythm: Normal rate and regular rhythm.     Heart sounds: Normal heart sounds. No murmur heard. Pulmonary:     Breath sounds: Normal breath sounds. No wheezing or rales.  Musculoskeletal:     Right lower leg: No edema.     Left ankle: Swelling present. Normal range of motion.  Feet:     Comments: Swelling on dorsal aspect of left foot - likely hematoma, about 3 cm in diameter Skin:    General: Skin is warm.     Findings: No rash.  Neurological:     General: No focal deficit present.     Mental Status: She is alert and oriented to person, place, and time.     Sensory: No sensory deficit.     Motor: No weakness.  Psychiatric:        Mood and Affect: Mood normal.        Behavior: Behavior normal.     BP 130/78 (BP Location: Right Arm, Patient Position: Sitting, Cuff Size:  Normal)   Pulse 81   Resp 16   Ht _0  (1.626 m)   Wt 180 lb 3.2 oz (81.7 kg)   SpO2 98%   BMI 30.93 kg/m  Wt Readings from Last 3 Encounters:  02/20/22 180 lb 3.2 oz (81.7 kg)  02/16/22 178 lb (80.7 kg)  12/03/21 175 lb (79.4 kg)    Lab Results  Component Value Date   TSH 0.851 10/31/2021   Lab Results  Component Value Date   WBC 6.7 12/03/2021   HGB 12.2 12/03/2021   HCT 36.5 12/03/2021   MCV 83.0 12/03/2021   PLT 255 12/03/2021   Lab Results  Component Value Date   NA 136 10/31/2021   K 4.0 10/31/2021   CO2 21 10/31/2021   GLUCOSE 85 10/31/2021   BUN 9 10/31/2021   CREATININE 0.60 10/31/2021   BILITOT 0.2 10/31/2021   ALKPHOS 48 10/31/2021   AST 9 10/31/2021   ALT 10 10/31/2021   PROT 6.3 10/31/2021   ALBUMIN 3.8 (L) 10/31/2021   CALCIUM 9.2 10/31/2021   ANIONGAP 5 09/04/2021   EGFR 112 10/31/2021   Lab Results  Component Value Date   CHOL 123 10/31/2021   Lab Results  Component Value Date   HDL 53 10/31/2021   Lab Results  Component Value Date   LDLCALC 56 10/31/2021   Lab Results  Component Value Date   TRIG 65 10/31/2021   Lab Results  Component Value Date   CHOLHDL 2.3 10/31/2021  Lab Results  Component Value Date   HGBA1C 5.6 07/06/2021      Assessment & Plan:   Problem List Items Addressed This Visit       Other   Encounter for examination following treatment at hospital    ER chart reviewed, including imaging Likely has left foot hematoma from recent injury, although type of injury unknown - unlikely site for DVT Since she is on aspirin, it may take longer for hematoma to resolve Advised leg elevation and applying ice Tylenol as needed for pain If persistent, will refer to Podiatry      Other Visit Diagnoses     Hematoma of left foot    -  Primary   Refused influenza vaccine           No orders of the defined types were placed in this encounter.   Follow-up: Return if symptoms worsen or fail to improve.     Lindell Spar, MD

## 2022-02-20 NOTE — Assessment & Plan Note (Addendum)
ER chart reviewed, including imaging Likely has left foot hematoma from recent injury, although type of injury unknown - unlikely site for DVT Since she is on aspirin, it may take longer for hematoma to resolve Advised leg elevation and applying ice Tylenol as needed for pain If persistent, will refer to Podiatry

## 2022-02-20 NOTE — Patient Instructions (Addendum)
Please try leg elevation and applying ice over the foot.  Please try standing for prolonged periods.  Okay to take Tylenol as needed for foot pain.  Please contact us if your pain or swelling does not start improving in a week.

## 2022-02-23 ENCOUNTER — Other Ambulatory Visit (HOSPITAL_COMMUNITY): Payer: Self-pay | Admitting: Internal Medicine

## 2022-02-23 DIAGNOSIS — Z1231 Encounter for screening mammogram for malignant neoplasm of breast: Secondary | ICD-10-CM

## 2022-02-24 ENCOUNTER — Ambulatory Visit: Payer: BC Managed Care – PPO | Admitting: Internal Medicine

## 2022-03-05 ENCOUNTER — Ambulatory Visit (HOSPITAL_COMMUNITY)
Admission: RE | Admit: 2022-03-05 | Discharge: 2022-03-05 | Disposition: A | Discharge status: Home / Self Care | Payer: BC Managed Care – PPO | Source: Ambulatory Visit | Attending: Internal Medicine | Admitting: Internal Medicine

## 2022-03-05 DIAGNOSIS — Z1231 Encounter for screening mammogram for malignant neoplasm of breast: Secondary | ICD-10-CM | POA: Insufficient documentation

## 2022-03-09 ENCOUNTER — Other Ambulatory Visit (HOSPITAL_COMMUNITY): Payer: Self-pay | Admitting: Internal Medicine

## 2022-03-09 DIAGNOSIS — R928 Other abnormal and inconclusive findings on diagnostic imaging of breast: Secondary | ICD-10-CM

## 2022-03-17 ENCOUNTER — Ambulatory Visit (HOSPITAL_COMMUNITY)
Admission: RE | Admit: 2022-03-17 | Discharge: 2022-03-17 | Disposition: A | Discharge status: Home / Self Care | Payer: BC Managed Care – PPO | Source: Ambulatory Visit | Attending: Internal Medicine | Admitting: Internal Medicine

## 2022-03-17 ENCOUNTER — Other Ambulatory Visit: Payer: Self-pay

## 2022-03-17 ENCOUNTER — Emergency Department (HOSPITAL_COMMUNITY): Admission: EM | Admit: 2022-03-17 | Payer: BC Managed Care – PPO | Source: Home / Self Care

## 2022-03-17 ENCOUNTER — Emergency Department (HOSPITAL_BASED_OUTPATIENT_CLINIC_OR_DEPARTMENT_OTHER): Payer: BC Managed Care – PPO

## 2022-03-17 ENCOUNTER — Encounter (HOSPITAL_BASED_OUTPATIENT_CLINIC_OR_DEPARTMENT_OTHER): Payer: Self-pay

## 2022-03-17 ENCOUNTER — Emergency Department (HOSPITAL_BASED_OUTPATIENT_CLINIC_OR_DEPARTMENT_OTHER)
Admission: EM | Admit: 2022-03-17 | Discharge: 2022-03-17 | Disposition: A | Discharge status: Home / Self Care | Payer: BC Managed Care – PPO | Attending: Emergency Medicine | Admitting: Emergency Medicine

## 2022-03-17 ENCOUNTER — Encounter (HOSPITAL_COMMUNITY): Payer: Self-pay

## 2022-03-17 DIAGNOSIS — R4781 Slurred speech: Secondary | ICD-10-CM | POA: Insufficient documentation

## 2022-03-17 DIAGNOSIS — R928 Other abnormal and inconclusive findings on diagnostic imaging of breast: Secondary | ICD-10-CM | POA: Diagnosis not present

## 2022-03-17 DIAGNOSIS — I10 Essential (primary) hypertension: Secondary | ICD-10-CM | POA: Insufficient documentation

## 2022-03-17 DIAGNOSIS — E876 Hypokalemia: Secondary | ICD-10-CM | POA: Insufficient documentation

## 2022-03-17 DIAGNOSIS — R202 Paresthesia of skin: Secondary | ICD-10-CM | POA: Diagnosis not present

## 2022-03-17 DIAGNOSIS — Z79899 Other long term (current) drug therapy: Secondary | ICD-10-CM | POA: Diagnosis not present

## 2022-03-17 DIAGNOSIS — Z7982 Long term (current) use of aspirin: Secondary | ICD-10-CM | POA: Insufficient documentation

## 2022-03-17 LAB — BASIC METABOLIC PANEL
Anion gap: 8 (ref 5–15)
BUN: 11 mg/dL (ref 6–20)
CO2: 26 mmol/L (ref 22–32)
Calcium: 9.1 mg/dL (ref 8.9–10.3)
Chloride: 102 mmol/L (ref 98–111)
Creatinine, Ser: 0.67 mg/dL (ref 0.44–1.00)
GFR, Estimated: >60 mL/min (ref >60)
Glucose, Bld: 94 mg/dL (ref 70–99)
Potassium: 3.1 mmol/L — ABNORMAL LOW (ref 3.5–5.1)
Sodium: 136 mmol/L (ref 135–145)

## 2022-03-17 LAB — CBC
HCT: 35.8 % — ABNORMAL LOW (ref 36.0–46.0)
Hemoglobin: 11.5 g/dL — ABNORMAL LOW (ref 12.0–15.0)
MCH: 27.1 pg (ref 26.0–34.0)
MCHC: 32.1 g/dL (ref 30.0–36.0)
MCV: 84.4 fL (ref 80.0–100.0)
Platelets: 301 10*3/uL (ref 150–400)
RBC: 4.24 MIL/uL (ref 3.87–5.11)
RDW: 13.1 % (ref 11.5–15.5)
WBC: 6.5 10*3/uL (ref 4.0–10.5)
nRBC: 0 % (ref 0.0–0.2)

## 2022-03-17 LAB — MAGNESIUM: Magnesium: 2 mg/dL (ref 1.7–2.4)

## 2022-03-17 MED ORDER — POTASSIUM CHLORIDE CRYS ER 20 MEQ PO TBCR
40.0000 meq | EXTENDED_RELEASE_TABLET | Freq: Once | ORAL | Status: AC
  Administered 2022-03-17: 40 meq via ORAL
  Filled 2022-03-17: qty 2

## 2022-03-17 MED ORDER — POTASSIUM CHLORIDE ER 10 MEQ PO TBCR: 10.0000 meq | EXTENDED_RELEASE_TABLET | Freq: Every day | ORAL | 0 refills | Status: AC

## 2022-03-17 NOTE — Discharge Instructions (Addendum)
Please call your neurologist tomorrow to discuss your ER visit tonight.  Your blood test show that your potassium level is mildly low, which can also lead to muscle twitching and facial twitching.  We did not see signs of a stroke on your CT scan.  It is possible you are experiencing something called "recrudescence," which is a return of your prior stroke symptoms, and this can be triggered by stress, illness, and other factors.  This is not a dangerous condition, and typically the symptoms go away completely within a day.  However we also talked about the small possibility that there may be an additional mini stroke.  Not all strokes can be seen on CT scans, and sometimes you need an MRI.    We discussed the option of transferring to [facility] for an MRI, but ultimately decided that you preferred to go home tonight.  I thought this was reasonable as you were having no symptoms at this time.    If your symptoms return, or you have new neurological deficits, you should return to the emergency department.

## 2022-03-17 NOTE — ED Provider Notes (Signed)
[facility] EMERGENCY DEPT Provider Note   CSN: [id number] Arrival date & time: 03/17/22  1939     History  Chief Complaint  Patient presents with   Lip Twitching    ELEYNA BRUGH is a 46 y.o. female with a prior history of migraine headaches, hypertension, TIA, presenting to ED with complaint of mild slurred speech and facial twitching.  She reports this occurred around 3 PM while at work today.  She was concerned because the symptoms were similar to the TIA for which she was hospitalized in March of this year.  Since arriving in the ED she feels that the symptoms have completely resolved.  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  Transthoracic echo was normal, TEE however showed a left to right shunt with possible PFO.  She was discharged on dual antiplatelet therapy for 3 months, is now currently only taking aspirin.  HPI     Home Medications Prior to Admission medications   Medication Sig Start Date End Date Taking? Authorizing Provider  potassium chloride (KLOR-CON) 10 MEQ tablet Take 1 tablet (10 mEq total) by mouth daily. 03/17/22 04/16/22 Yes Audi Conover, Carola Rhine, MD  Acetaminophen (MIDOL PO) Take 2 tablets by mouth daily as needed (cramping).    [provider]  amLODipine (NORVASC) 10 MG tablet TAKE 1 TABLET(10 MG) BY MOUTH DAILY 02/09/22   Lindell Spar, MD  aspirin EC 81 MG EC tablet Take 1 tablet (81 mg total) by mouth daily. Swallow whole. 07/09/21   Darliss Cheney, MD  ferrous sulfate 325 (65 FE) MG tablet Take 325 mg by mouth daily with breakfast.    [provider]  Naphazoline-Pheniramine (VISINE-A OP) Place 1 drop into both eyes daily as needed (dry eyes).    [provider]  rosuvastatin (CRESTOR) 20 MG tablet Take 1  tablet (20 mg total) by mouth daily. 08/06/21   Lindell Spar, MD  ZAFEMY 150-35 MCG/24HR transdermal patch APPLY 1 PATCH TOPICALLY TO THE SKIN 1 TIME A WEEK 12/04/21   Cresenzo-Dishmon, Joaquim Lai, CNM      Allergies    Patient has no known allergies.    Review of Systems   Review of Systems  Physical Exam Updated Vital Signs BP (!) 149/98   Pulse (!) 59   Temp 98.2 F (36.8 C)   Resp 18   Ht '5\' 4"'$  (1.626 m)   Wt 81.7 kg   SpO2 100%   BMI 30.92 kg/m  Physical Exam Constitutional:      General: She is not in acute distress. HENT:     Head: Normocephalic and atraumatic.  Eyes:     Conjunctiva/sclera: Conjunctivae normal.     Pupils: Pupils are equal, round, and reactive to light.  Cardiovascular:     Rate and Rhythm: Normal rate and regular rhythm.  Pulmonary:     Effort: Pulmonary effort is normal. No respiratory distress.  Abdominal:     General: There is no distension.     Tenderness: There is no abdominal tenderness.  Skin:    General: Skin is warm and dry.  Neurological:     General: No focal deficit present.     Mental Status: She is alert and oriented to person, place, and time. Mental status is at baseline.  Cranial Nerves: No cranial nerve deficit.     Sensory: No sensory deficit.     Motor: No weakness.  Psychiatric:        Mood and Affect: Mood normal.        Behavior: Behavior normal.     ED Results / Procedures / Treatments   Labs (all labs ordered are listed, but only abnormal results are displayed) Labs Reviewed  BASIC METABOLIC PANEL - Abnormal; Notable for the following components:      Result Value   Potassium 3.1 (*)    All other components within normal limits  CBC - Abnormal; Notable for the following components:   Hemoglobin 11.5 (*)    HCT 35.8 (*)    All other components within normal limits  MAGNESIUM    EKG None  Radiology CT Head Wo Contrast  Result Date: 03/17/2022 CLINICAL DATA:  Brief aphasia EXAM: CT HEAD WITHOUT  CONTRAST TECHNIQUE: Contiguous axial images were obtained from the base of the skull through the vertex without intravenous contrast. RADIATION DOSE REDUCTION: This exam was performed according to the departmental dose-optimization program which includes automated exposure control, adjustment of the mA and/or kV according to patient size and/or use of iterative reconstruction technique. COMPARISON:  07/05/2021 FINDINGS: Brain: No evidence of acute infarction, hemorrhage, hydrocephalus, extra-axial collection or mass lesion/mass effect. Vascular: No hyperdense vessel or unexpected calcification. Skull: Normal. Negative for fracture or focal lesion. Sinuses/Orbits: No acute finding. Other: None. IMPRESSION: No acute intracranial abnormality noted. Electronically Signed   By: Inez Catalina M.D.   On: 03/17/2022 22:30   MM DIAG BREAST TOMO UNI LEFT  Result Date: 03/17/2022 CLINICAL DATA:  Screening recall for possible left breast mass. EXAM: DIGITAL DIAGNOSTIC UNILATERAL LEFT MAMMOGRAM WITH TOMOSYNTHESIS TECHNIQUE: Left digital diagnostic mammography and breast tomosynthesis was performed. COMPARISON:  Previous exam(s). ACR Breast Density Category c: The breast tissue is heterogeneously dense, which may obscure small masses. FINDINGS: Additional tomograms were performed of the left breast. The initially questioned possible left breast mass resolves on the additional imaging with findings compatible with an area of overlapping fibroglandular tissue. There is no mammographic evidence of malignancy in the left breast. IMPRESSION: No mammographic evidence of malignancy in the left breast. RECOMMENDATION: Screening mammogram in one year.(Code:SM-B-01Y) I have discussed the findings and recommendations with the patient. If applicable, a reminder letter will be sent to the patient regarding the next appointment. BI-RADS CATEGORY  1: Negative. Electronically Signed   By: Everlean Alstrom M.D.   On: 03/17/2022 09:18     Procedures Procedures    Medications Ordered in ED Medications  potassium chloride SA (KLOR-CON M) CR tablet 40 mEq (40 mEq Oral Given 03/17/22 2234)    ED Course/ Medical Decision Making/ A&P Clinical Course as of 03/17/22 2254  Tue Mar 17, 2022  2248 Patient reassessed remains asymptomatic, she is wanting to go home.  Return precautions discussed.  Okay for discharge [MT]    Clinical Course User Index [MT] Tywanda Rice, Carola Rhine, MD                           Medical Decision Making Amount and/or Complexity of Data Reviewed Labs: ordered. Radiology: ordered.  Risk Prescription drug management.   This patient presents to the ED with concern for lip twitching, slurred speech. This involves an extensive number of treatment options, and is a complaint that carries with it a high risk of complications and morbidity.  The  differential diagnosis includes CVA versus recrudescence of prior stroke versus other  External records from outside source obtained and reviewed including hospitalization records and discharge summary from his stroke hospitalization in March of this year  I ordered and personally interpreted labs.  The pertinent results include: Mild hypokalemia potassium 3.1, no other significant emergencies  I ordered imaging studies including CT scan of the brain I independently visualized and interpreted imaging which showed no acute infarct I agree with the radiologist interpretation  I ordered medication including oral potassium for repletion  I have reviewed the patients home medicines and have made adjustments as needed  Test Considered: I had a shared decision-making discussion with the patient regarding further imaging for stroke.  I explained that a negative CT scan does not rule out the possibility of stroke, and in fact on her prior visitation the stroke was negative on CT and visible only on MRI.  However MRI would require transfer to [facility] as it is  not available here.  We also discussed the possibility that this was recrudescence of her prior stroke, given that they were the exact same presentation and symptoms, and that they have now resolved.  She reports that she has been under a lot of stress today, which could be a trigger for recrudescence.  Ultimately the patient decided to go home.  I recommended she continue with her aspirin at home, and contact her neurologist in the morning.  She is to return to the hospital or call 911 if she were to develop any new strokelike symptoms.  She verbalized understanding.   After the interventions noted above, I reevaluated the patient and found that they have: improved  Dispostion:  After consideration of the diagnostic results and the patients response to treatment, I feel that the patent would benefit from close outpatient follow-up.         Final Clinical Impression(s) / ED Diagnoses Final diagnoses:  Paresthesia  Hypokalemia    Rx / DC Orders ED Discharge Orders          Ordered    potassium chloride (KLOR-CON) 10 MEQ tablet  Daily        03/17/22 2251              Wyvonnia Dusky, MD 03/17/22 2254

## 2022-03-17 NOTE — ED Triage Notes (Signed)
Patient here POV from Home.  Endorses Superior Mid Lip "Twitching" that began at 1515. No Symptoms prior to that. Still endorses Twitching.   NAD Noted during Triage. A&Ox4. GCS 15. Ambulatory.

## 2022-03-18 ENCOUNTER — Telehealth: Payer: Self-pay

## 2022-03-18 ENCOUNTER — Encounter: Payer: Self-pay | Admitting: Family Medicine

## 2022-03-18 NOTE — Telephone Encounter (Signed)
Transition Care Management Unsuccessful Follow-up Telephone Call  Date of discharge and from where:  [facility] ED 03-17-22 Dx: [place]  Attempts:  1st Attempt  Reason for unsuccessful TCM follow-up call:  Left voice message   Juanda Crumble LPN [place] Direct Dial [phone]

## 2022-03-19 ENCOUNTER — Encounter: Payer: Self-pay | Admitting: Family Medicine

## 2022-03-19 NOTE — Telephone Encounter (Signed)
Transition Care Management Follow-up Telephone Call Date of discharge and from where:   [facility] ED 03-17-22 Dx: parathesia   How have you been since you were released from the hospital? Doing ok  Any questions or concerns? No  Items Reviewed: Did the pt receive and understand the discharge instructions provided? Yes  Medications obtained and verified? Yes  Other? No  Any new allergies since your discharge? no Dietary orders reviewed? Yes Do you have support at home? Yes   Home Care and Equipment/Supplies: Were home health services ordered? not applicable If so, what is the name of the agency? na  Has the agency set up a time to come to the patient's home? not applicable Were any new equipment or medical supplies ordered?  No What is the name of the medical supply agency? na Were you able to get the supplies/equipment? not applicable Do you have any questions related to the use of the equipment or supplies? No  Functional Questionnaire: (I = Independent and D = Dependent) ADLs: I  Bathing/Dressing- I  Meal Prep- I  Eating- I  Maintaining continence- I  Transferring/Ambulation- I  Managing Meds- I  Follow up appointments reviewed:  PCP Hospital f/u appt confirmed? No  - pt followed up with Neurologist- does not feel the need to see PCP at this time. [facility] f/u appt confirmed? Yes NEUROLOGIST Are transportation arrangements needed? No  If their condition worsens, is the pt aware to call PCP or go to the Emergency Dept.? Yes Was the patient provided with contact information for the PCP's office or ED? Yes Was to pt encouraged to call back with questions or concerns? Yes   Juanda Crumble LPN [place] Direct Dial [phone]

## 2022-03-24 NOTE — Telephone Encounter (Signed)
Patient called to pay the form fee but when informed of the charge, stated she will go another route.

## 2022-03-30 DIAGNOSIS — Z0289 Encounter for other administrative examinations: Secondary | ICD-10-CM

## 2022-04-30 ENCOUNTER — Ambulatory Visit: Payer: BC Managed Care – PPO | Admitting: Internal Medicine

## 2022-04-30 ENCOUNTER — Encounter: Payer: Self-pay | Admitting: Internal Medicine

## 2022-04-30 VITALS — BP 128/76 | HR 96 | Ht 64.0 in | Wt 178.4 lb

## 2022-04-30 DIAGNOSIS — Z8673 Personal history of transient ischemic attack (TIA), and cerebral infarction without residual deficits: Secondary | ICD-10-CM

## 2022-04-30 DIAGNOSIS — E876 Hypokalemia: Secondary | ICD-10-CM | POA: Insufficient documentation

## 2022-04-30 DIAGNOSIS — D250 Submucous leiomyoma of uterus: Secondary | ICD-10-CM

## 2022-04-30 DIAGNOSIS — Z1211 Encounter for screening for malignant neoplasm of colon: Secondary | ICD-10-CM

## 2022-04-30 DIAGNOSIS — I10 Essential (primary) hypertension: Secondary | ICD-10-CM | POA: Diagnosis not present

## 2022-04-30 DIAGNOSIS — D509 Iron deficiency anemia, unspecified: Secondary | ICD-10-CM

## 2022-04-30 DIAGNOSIS — Z23 Encounter for immunization: Secondary | ICD-10-CM | POA: Diagnosis not present

## 2022-04-30 NOTE — Assessment & Plan Note (Signed)
Has chronic menorrhagia Needs to see OB/GYN

## 2022-04-30 NOTE — Assessment & Plan Note (Signed)
Recent ER visit note reviewed Has completed oral potassium supplement for hypokalemia Unclear if paresthesia was totally attributable to hypokalemia, but symptoms have resolved now Check BMP

## 2022-04-30 NOTE — Patient Instructions (Signed)
Please continue taking medications as prescribed.  Please continue to follow low salt diet and perform moderate exercise/walking at least 150 mins/week. 

## 2022-04-30 NOTE — Assessment & Plan Note (Signed)
BP Readings from Last 1 Encounters:  04/30/22 128/76   Well-controlled with amlodipine Counseled for compliance with the medications Advised DASH diet and moderate exercise/walking, at least 150 mins/week

## 2022-04-30 NOTE — Progress Notes (Signed)
Established Patient Office Visit  Subjective:  Patient ID: Margaret Owen, female    DOB: Feb 08, 1976  Age: 47 y.o. MRN: [id number]  CC:  Chief Complaint  Patient presents with   Hypertension    Patient is here for a six month follow up for hypertension     HPI Margaret Owen is a 47 y.o. female with past medical history of HTN, TIA, IDA and HLD who presents for f/u of her chronic medical conditions.  HTN: BP is well-controlled. Takes medications regularly. Patient denies dizziness, chest pain, dyspnea or palpitations.  History of TIA: She currently takes aspirin and statin.  Denies any numbness or tingling of the UE or LE.  Denies any dizziness or visual disturbance.  She had an episode of facial twitching and went to ER.  Her symptoms had resolved by the time she arrived in the ER.  Her potassium level was found to be low and was given potassium supplement, which she has completed.  IDA and history of uterine leiomyoma: She has chronic heavy menstrual cycles.  She is trying to find a new OB/GYN.  Past Medical History:  Diagnosis Date   Bell palsy    when she was a teenager   Breakthrough bleeding associated with intrauterine device (IUD) 03/22/2017   Hypertension    IUD contraception 10/05/2016   Malpositioned IUD 04/14/2017   REmoved 04/14/17    MIGRAINES, HX OF 06/11/2008   Qualifier: Diagnosis of  By: Truett Mainland MD, Christine     Pedal edema 10/05/2016   TIA (transient ischemic attack)     Past Surgical History:  Procedure Laterality Date   CERVICAL CERCLAGE     CESAREAN SECTION     2000,2003,2005   CESAREAN SECTION  08/17/1998   TEE WITHOUT CARDIOVERSION N/A 07/08/2021   Procedure: TRANSESOPHAGEAL ECHOCARDIOGRAM (TEE);  Surgeon: Werner Lean, MD;  Location[facility] ENDOSCOPY;  Service: Cardiovascular;  Laterality: N/A;  being done in cath lab holding    Family History  Problem Relation Age of Onset   Heart failure Father    Alcohol abuse Father    Heart  disease Father 60       died at 5   Hypertension Father    Stroke Mother    Hypertension Mother    Arthritis Mother    Asthma Mother    Diabetes Mother    Hyperlipidemia Mother    Glaucoma Mother    Breast cancer Maternal Aunt    Colon cancer Maternal Aunt    Stroke Maternal Aunt    Glaucoma Sister    Asthma Brother     Social History   Socioeconomic History   Marital status: Married    Spouse name: Harrell Gave   Number of children: 3   Years of education: 59   Highest education level: Not on file  Occupational History   Occupation: Artist    Comment: state of [place]  Tobacco Use   Smoking status: Never   Smokeless tobacco: Never  Vaping Use   Vaping Use: Never used  Substance and Sexual Activity   Alcohol use: Yes    Comment: occasional   Drug use: No   Sexual activity: Yes    Birth control/protection: Patch  Other Topics Concern   Not on file  Social History Narrative   Husband is a Lorena with husband Christopher-married 36 years   Three daughters: all live at home 21, 60,15    Works for [employer], career  counselor      Dog: Bandit       Enjoys: gardening, flowers      Diet: eat all food groups   Caffeine: 2 daily   Water: 2 bottles daily       Wears seat belt    Does not use phone while driving    Oceanographer at home    Social Determinants of Health   Financial Resource Strain: Low Risk  (07/27/2019)   Overall Financial Resource Strain (CARDIA)    Difficulty of Paying Living Expenses: Not hard at all  Food Insecurity: No Food Insecurity (07/27/2019)   Hunger Vital Sign    Worried About Running Out of Food in the Last Year: Never true    Ran Out of Food in the Last Year: Never true  Transportation Needs: No Transportation Needs (07/27/2019)   PRAPARE - Hydrologist (Medical): No    Lack of Transportation (Non-Medical): No  Physical Activity: Inactive (07/27/2019)   Exercise Vital Sign    Days of  Exercise per Week: 0 days    Minutes of Exercise per Session: 0 min  Stress: No Stress Concern Present (07/27/2019)   [place]    Feeling of Stress : Only a little  Social Connections: Moderately Isolated (07/27/2019)   Social Connection and Isolation Panel [NHANES]    Frequency of Communication with Friends and Family: More than three times a week    Frequency of Social Gatherings with Friends and Family: More than three times a week    Attends Religious Services: Never    Marine scientist or Organizations: No    Attends Archivist Meetings: Never    Marital Status: Married  Human resources officer Violence: Not At Risk (07/27/2019)   Humiliation, Afraid, Rape, and Kick questionnaire    Fear of Current or Ex-Partner: No    Emotionally Abused: No    Physically Abused: No    Sexually Abused: No    Outpatient Medications Prior to Visit  Medication Sig Dispense Refill   Acetaminophen (MIDOL PO) Take 2 tablets by mouth daily as needed (cramping).     amLODipine (NORVASC) 10 MG tablet TAKE 1 TABLET(10 MG) BY MOUTH DAILY 90 tablet 1   aspirin EC 81 MG EC tablet Take 1 tablet (81 mg total) by mouth daily. Swallow whole. 30 tablet 1   ferrous sulfate 325 (65 FE) MG tablet Take 325 mg by mouth daily with breakfast.     Naphazoline-Pheniramine (VISINE-A OP) Place 1 drop into both eyes daily as needed (dry eyes).     potassium chloride (KLOR-CON) 10 MEQ tablet Take 1 tablet (10 mEq total) by mouth daily. 30 tablet 0   rosuvastatin (CRESTOR) 20 MG tablet Take 1 tablet (20 mg total) by mouth daily. 90 tablet 3   ZAFEMY 150-35 MCG/24HR transdermal patch APPLY 1 PATCH TOPICALLY TO THE SKIN 1 TIME A WEEK 12 patch 3   No facility-administered medications prior to visit.    No Known Allergies  ROS Review of Systems  Constitutional:  Negative for fatigue, fever and unexpected weight change.  HENT:  Negative for facial  swelling, sinus pain and tinnitus.   Eyes:  Negative for pain and itching.  Respiratory:  Negative for cough and shortness of breath.   Cardiovascular:  Negative for chest pain and palpitations.  Gastrointestinal:  Positive for constipation. Negative for diarrhea.  Endocrine: Negative for polyphagia and polyuria.  Genitourinary:  Positive for menstrual problem and vaginal bleeding. Negative for difficulty urinating, dysuria and hematuria.  Musculoskeletal:  Negative for neck pain and neck stiffness.  Skin:  Negative for rash.  Neurological:  Negative for tremors, speech difficulty, weakness, light-headedness, numbness and headaches.  Psychiatric/Behavioral:  Negative for agitation and behavioral problems.       Objective:    Physical Exam Constitutional:      Appearance: Normal appearance.  HENT:     Head: Normocephalic and atraumatic.     Nose: Nose normal.     Mouth/Throat:     Mouth: Mucous membranes are moist.  Eyes:     General: No scleral icterus.    Extraocular Movements: Extraocular movements intact.  Cardiovascular:     Rate and Rhythm: Normal rate and regular rhythm.     Pulses: Normal pulses.     Heart sounds: Normal heart sounds. No murmur heard. Pulmonary:     Breath sounds: Normal breath sounds. No wheezing or rales.  Abdominal:     Palpations: Abdomen is soft.     Tenderness: There is no abdominal tenderness.  Musculoskeletal:     Right lower leg: No edema.     Left lower leg: No edema.  Skin:    General: Skin is warm.     Findings: No rash.  Neurological:     General: No focal deficit present.     Mental Status: She is alert and oriented to person, place, and time.     Cranial Nerves: No cranial nerve deficit.     Sensory: No sensory deficit.     Motor: No weakness.  Psychiatric:        Mood and Affect: Mood normal.        Behavior: Behavior normal.     BP 128/76 (BP Location: Left Arm, Cuff Size: Normal)   Pulse 96   Ht '5\' 4"'$  (1.626 m)   Wt  178 lb 6.4 oz (80.9 kg)   SpO2 98%   BMI 30.62 kg/m  Wt Readings from Last 3 Encounters:  04/30/22 178 lb 6.4 oz (80.9 kg)  03/17/22 180 lb 1.9 oz (81.7 kg)  02/20/22 180 lb 3.2 oz (81.7 kg)    Lab Results  Component Value Date   TSH 0.851 10/31/2021   Lab Results  Component Value Date   WBC 6.5 03/17/2022   HGB 11.5 (L) 03/17/2022   HCT 35.8 (L) 03/17/2022   MCV 84.4 03/17/2022   PLT 301 03/17/2022   Lab Results  Component Value Date   NA 136 03/17/2022   K 3.1 (L) 03/17/2022   CO2 26 03/17/2022   GLUCOSE 94 03/17/2022   BUN 11 03/17/2022   CREATININE 0.67 03/17/2022   BILITOT 0.2 10/31/2021   ALKPHOS 48 10/31/2021   AST 9 10/31/2021   ALT 10 10/31/2021   PROT 6.3 10/31/2021   ALBUMIN 3.8 (L) 10/31/2021   CALCIUM 9.1 03/17/2022   ANIONGAP 8 03/17/2022   EGFR 112 10/31/2021   Lab Results  Component Value Date   CHOL 123 10/31/2021   Lab Results  Component Value Date   HDL 53 10/31/2021   Lab Results  Component Value Date   LDLCALC 56 10/31/2021   Lab Results  Component Value Date   TRIG 65 10/31/2021   Lab Results  Component Value Date   CHOLHDL 2.3 10/31/2021   Lab Results  Component Value Date   HGBA1C 5.6 07/06/2021      Assessment & Plan:   Problem  List Items Addressed This Visit       Cardiovascular and Mediastinum   Essential hypertension - Primary    BP Readings from Last 1 Encounters:  04/30/22 128/76  Well-controlled with amlodipine Counseled for compliance with the medications Advised DASH diet and moderate exercise/walking, at least 150 mins/week        Genitourinary   Fibroids, submucosal    Has chronic menorrhagia Needs to see OB/GYN      Relevant Orders   CBC with Differential/Platelet     Other   Iron deficiency anemia    Likely due to chronic bleeding/menorrhagia Needs OB/GYN follow-up for uterine fibroids Takes oral iron supplements now      Relevant Orders   CBC with Differential/Platelet   H/O: CVA  (cerebrovascular accident)    On aspirin and statin now No residual deficit Followed by neurology      Relevant Orders   CBC with Differential/Platelet   Hypokalemia    Recent ER visit note reviewed Has completed oral potassium supplement for hypokalemia Unclear if paresthesia was totally attributable to hypokalemia, but symptoms have resolved now Check BMP      Relevant Orders   Basic Metabolic Panel (BMET)   Other Visit Diagnoses     Screening for colon cancer       Relevant Orders   Ambulatory referral to Gastroenterology   Need for tetanus booster       Relevant Orders   Tdap vaccine greater than or equal to 7yo IM (Completed)       No orders of the defined types were placed in this encounter.   Follow-up: Return in about 6 months (around 10/29/2022) for Annual physical.    Lindell Spar, MD

## 2022-04-30 NOTE — Assessment & Plan Note (Signed)
Likely due to chronic bleeding/menorrhagia Needs OB/GYN follow-up for uterine fibroids Takes oral iron supplements now

## 2022-04-30 NOTE — Assessment & Plan Note (Signed)
On aspirin and statin now No residual deficit Followed by neurology

## 2022-05-01 ENCOUNTER — Encounter: Payer: Self-pay | Admitting: *Deleted

## 2022-05-01 LAB — CBC WITH DIFFERENTIAL/PLATELET
Basophils Absolute: 0.1 10*3/uL (ref 0.0–0.2)
Basos: 1 %
EOS (ABSOLUTE): 0.4 10*3/uL (ref 0.0–0.4)
Eos: 4 %
Hematocrit: 37.2 % (ref 34.0–46.6)
Hemoglobin: 11.8 g/dL (ref 11.1–15.9)
Immature Grans (Abs): 0 10*3/uL (ref 0.0–0.1)
Immature Granulocytes: 0 %
Lymphocytes Absolute: 2.3 10*3/uL (ref 0.7–3.1)
Lymphs: 28 %
MCH: 25.2 pg — ABNORMAL LOW (ref 26.6–33.0)
MCHC: 31.7 g/dL (ref 31.5–35.7)
MCV: 80 fL (ref 79–97)
Monocytes Absolute: 0.6 10*3/uL (ref 0.1–0.9)
Monocytes: 7 %
Neutrophils Absolute: 4.8 10*3/uL (ref 1.4–7.0)
Neutrophils: 60 %
Platelets: 248 10*3/uL (ref 150–450)
RBC: 4.68 x10E6/uL (ref 3.77–5.28)
RDW: 12.8 % (ref 11.7–15.4)
WBC: 8.2 10*3/uL (ref 3.4–10.8)

## 2022-05-01 LAB — BASIC METABOLIC PANEL
BUN/Creatinine Ratio: 13 (ref 9–23)
BUN: 9 mg/dL (ref 6–24)
CO2: 22 mmol/L (ref 20–29)
Calcium: 9.3 mg/dL (ref 8.7–10.2)
Chloride: 101 mmol/L (ref 96–106)
Creatinine, Ser: 0.7 mg/dL (ref 0.57–1.00)
Glucose: 95 mg/dL (ref 70–99)
Potassium: 4 mmol/L (ref 3.5–5.2)
Sodium: 135 mmol/L (ref 134–144)
eGFR: 107 mL/min/{1.73_m2} (ref >59)

## 2022-05-19 ENCOUNTER — Encounter: Payer: Self-pay | Admitting: *Deleted

## 2022-05-19 NOTE — Patient Instructions (Addendum)
  Procedure: colonoscopy  Estimated body mass index is 29.87 kg/m as calculated from the following:   Height as of this encounter: '5\' 4"'$  (1.626 m).   Weight as of this encounter: 174 lb (78.9 kg).   Have you had a colonoscopy before?  No  Do you have family history of colon cancer?  Maternal aunt  Do you have a family history of polyps? unknown  Previous colonoscopy with polyps removed? No  Do you have a history colorectal cancer?   no  Are you diabetic?  no  Do you have a prosthetic or mechanical heart valve? no  Do you have a pacemaker/defibrillator?   no  Have you had endocarditis/atrial fibrillation?  no  Do you use supplemental oxygen/CPAP?  no  Have you had joint replacement within the last 12 months?  no  Do you tend to be constipated or have to use laxatives?  no   Do you have history of alcohol use? If yes, how much and how often.  no  Do you have history or are you using drugs? If yes, what do are you  using?  no  Have you ever had a stroke/heart attack?  TIA in 06/2021  Have you ever had a heart or other vascular stent placed,?no  Do you take weight loss medication? no  female patients,: have you had a hysterectomy? no                              are you post menopausal?  no                              do you still have your menstrual cycle? yes    Date of last menstrual period? 1/7-1/13,approx.  Do you take any blood-thinning medications such as: (Plavix, aspirin, Coumadin, Aggrenox, Brilinta, Xarelto, Eliquis, Pradaxa, Savaysa or Effient)? asiprin  If yes we need the name, milligram, dosage and who is prescribing doctor: Aspirin 81 mg, Dr.Patel             Current Outpatient Medications  Medication Sig Dispense Refill   Acetaminophen (MIDOL PO) Take 2 tablets by mouth daily as needed (cramping).     amLODipine (NORVASC) 10 MG tablet TAKE 1 TABLET(10 MG) BY MOUTH DAILY 90 tablet 1   aspirin EC 81 MG EC tablet Take 1 tablet (81 mg total) by mouth  daily. Swallow whole. 30 tablet 1   Cyanocobalamin (VITAMIN B-12 PO) Take 1 tablet by mouth daily.     ferrous sulfate 325 (65 FE) MG tablet Take 325 mg by mouth daily with breakfast.     Naphazoline-Pheniramine (VISINE-A OP) Place 1 drop into both eyes daily as needed (dry eyes).     potassium chloride (KLOR-CON) 10 MEQ tablet Take 1 tablet (10 mEq total) by mouth daily. 30 tablet 0   rosuvastatin (CRESTOR) 20 MG tablet Take 1 tablet (20 mg total) by mouth daily. 90 tablet 3   VITAMIN D PO Take 1 tablet by mouth daily.     ZAFEMY 150-35 MCG/24HR transdermal patch APPLY 1 PATCH TOPICALLY TO THE SKIN 1 TIME A WEEK 12 patch 3   No current facility-administered medications for this visit.    No Known Allergies

## 2022-05-21 NOTE — Progress Notes (Signed)
ASA 3, due to TIA Ok to schedule.  Will need preg test Hold iron 7 days

## 2022-05-22 ENCOUNTER — Encounter: Payer: Self-pay | Admitting: *Deleted

## 2022-05-22 DIAGNOSIS — Z1211 Encounter for screening for malignant neoplasm of colon: Secondary | ICD-10-CM

## 2022-05-22 MED ORDER — NA SULFATE-K SULFATE-MG SULF 17.5-3.13-1.6 GM/177ML PO SOLN: ORAL | 0 refills | Status: DC

## 2022-05-22 NOTE — Progress Notes (Signed)
Pt scheduled for 2/19 with Dr. Abbey Chatters

## 2022-05-22 NOTE — Progress Notes (Signed)
See mychart message  

## 2022-05-22 NOTE — Progress Notes (Signed)
LMOVM to call back 

## 2022-05-25 NOTE — Progress Notes (Signed)
Referral completed

## 2022-05-26 ENCOUNTER — Encounter: Payer: Self-pay | Admitting: *Deleted

## 2022-05-31 IMAGING — MG MM DIGITAL SCREENING BILAT W/ TOMO AND CAD
8 series · 9 of 24 positions shown · non-contrast
Comparison: Previous exam(s).

CLINICAL DATA: Screening.

EXAM:
DIGITAL SCREENING BILATERAL MAMMOGRAM WITH TOMOSYNTHESIS AND CAD
TECHNIQUE: Bilateral screening digital craniocaudal and mediolateral oblique
mammograms were obtained. Bilateral screening digital breast
tomosynthesis was performed. The images were evaluated with
computer-aided detection.

[L CC synth-2D]
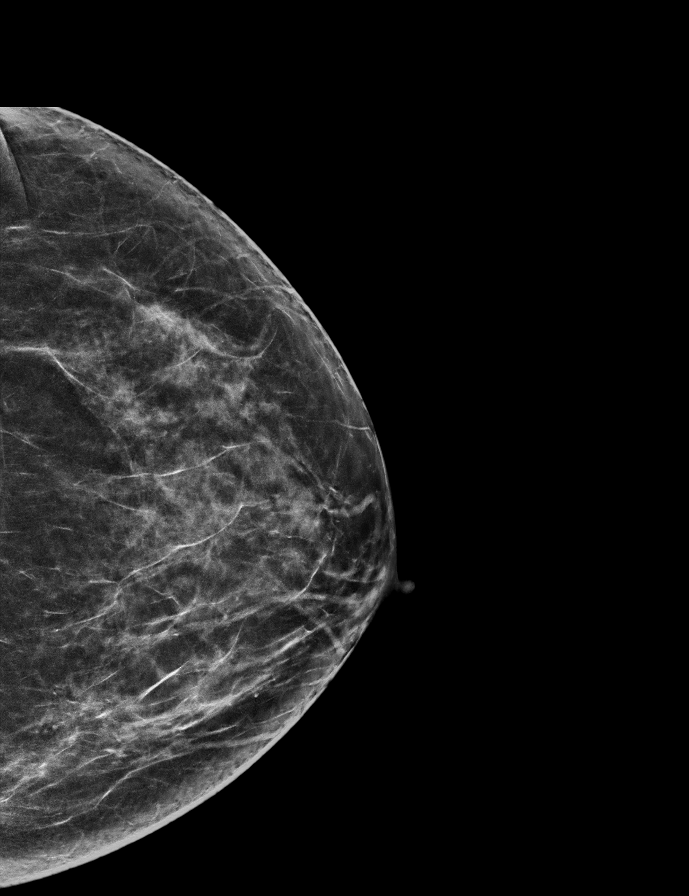

[L MLO synth-2D]
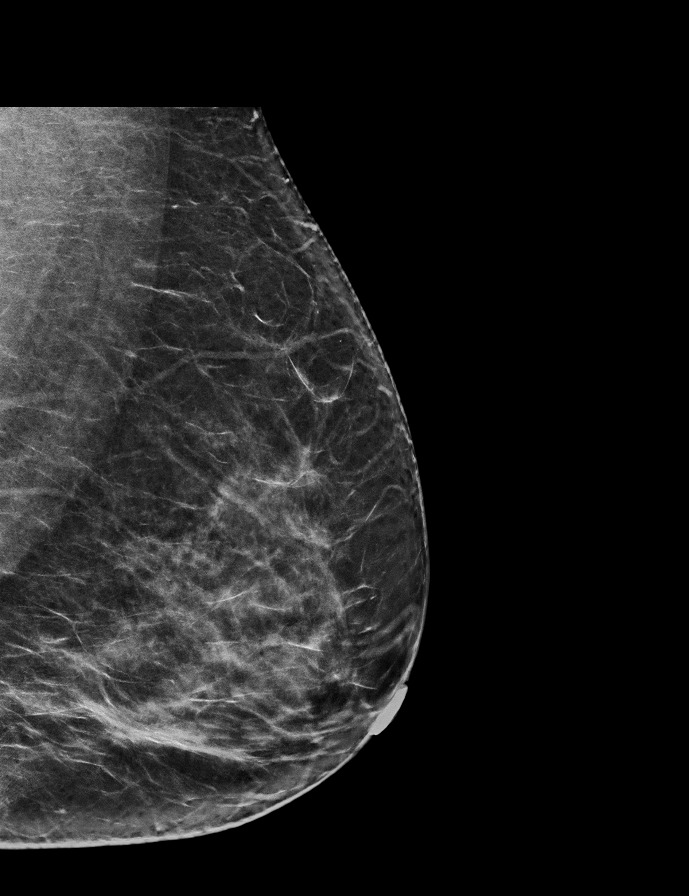

[R MLO synth-2D]
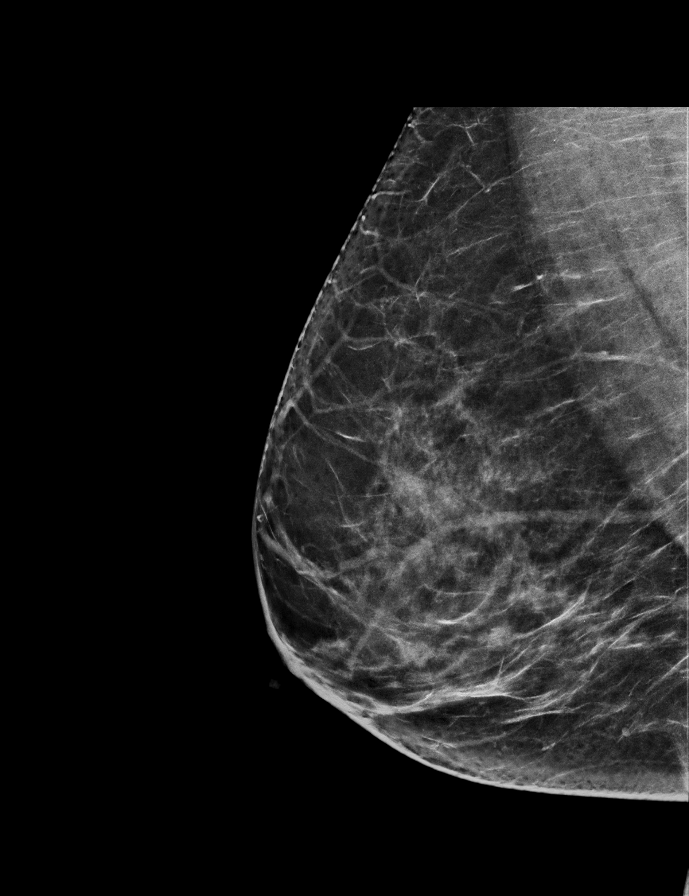

[R CC synth-2D]
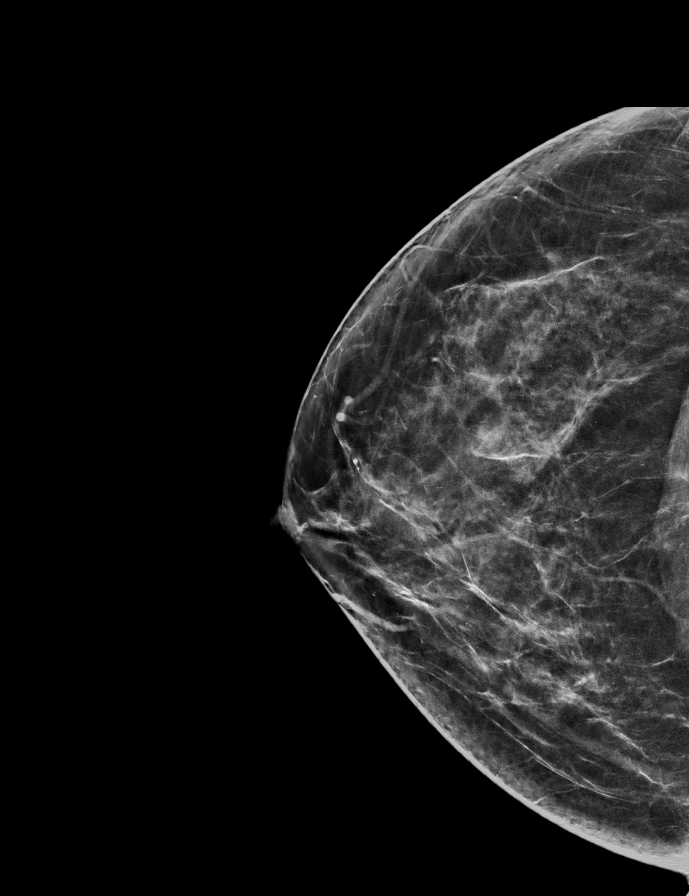

[L CC tomo · 2 of 67 frames shown]
[frame 22/67]
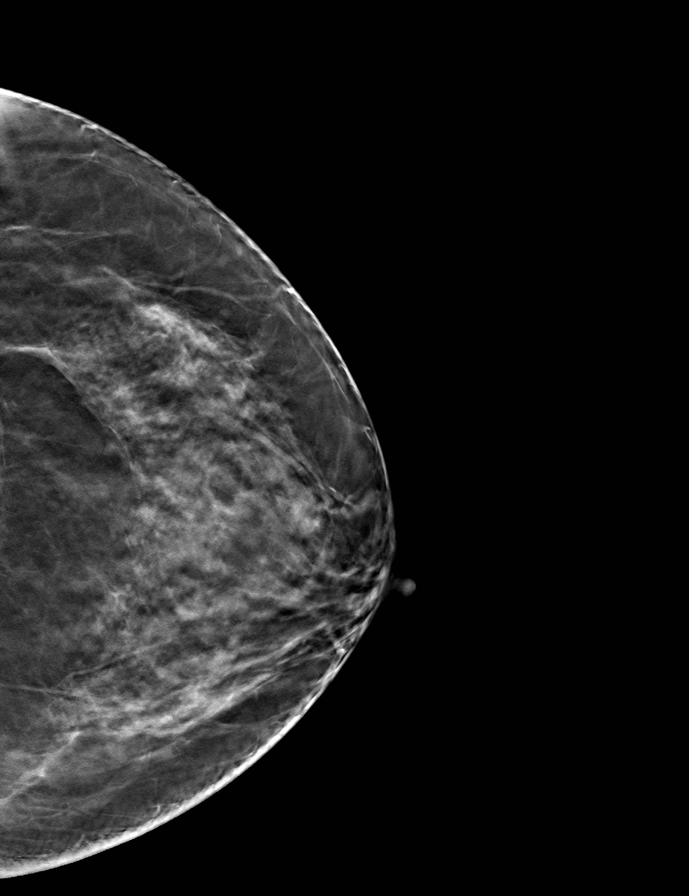
[frame 34/67]
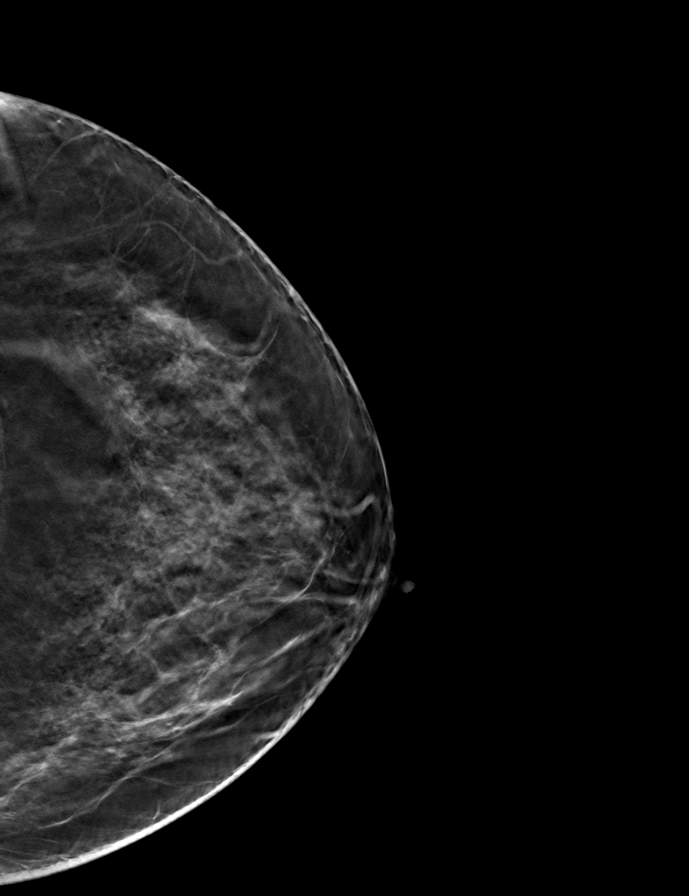

[R MLO tomo · tomo slice 37/73.0]
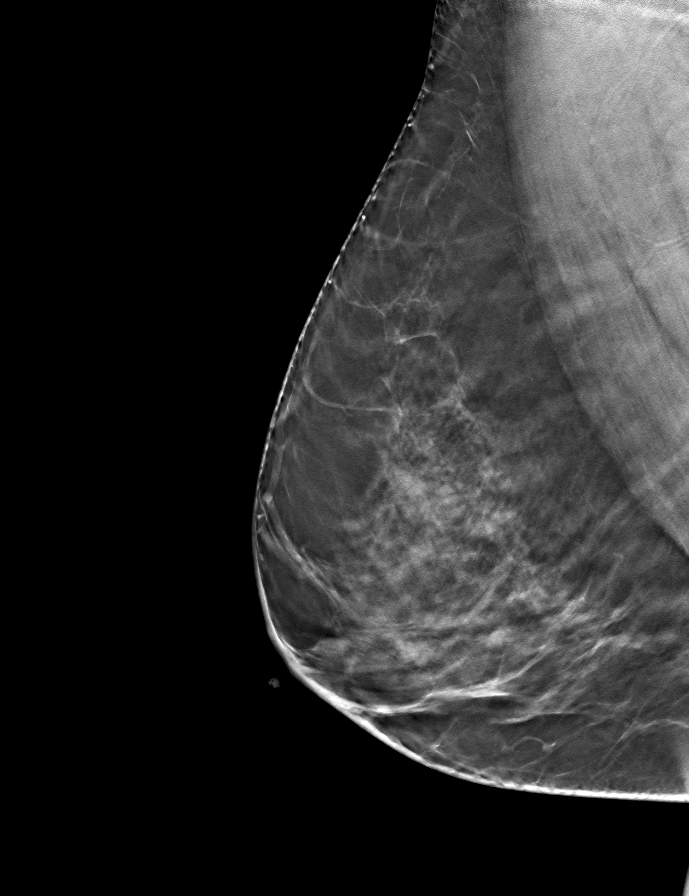

[L MLO tomo · tomo slice 37/74.0]
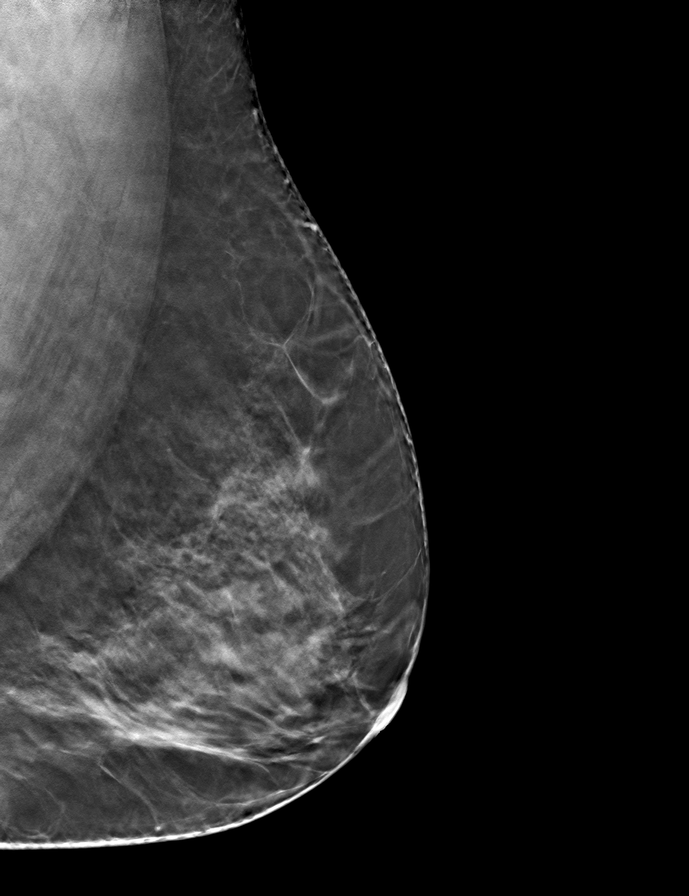

[R CC tomo · tomo slice 35/69.0]
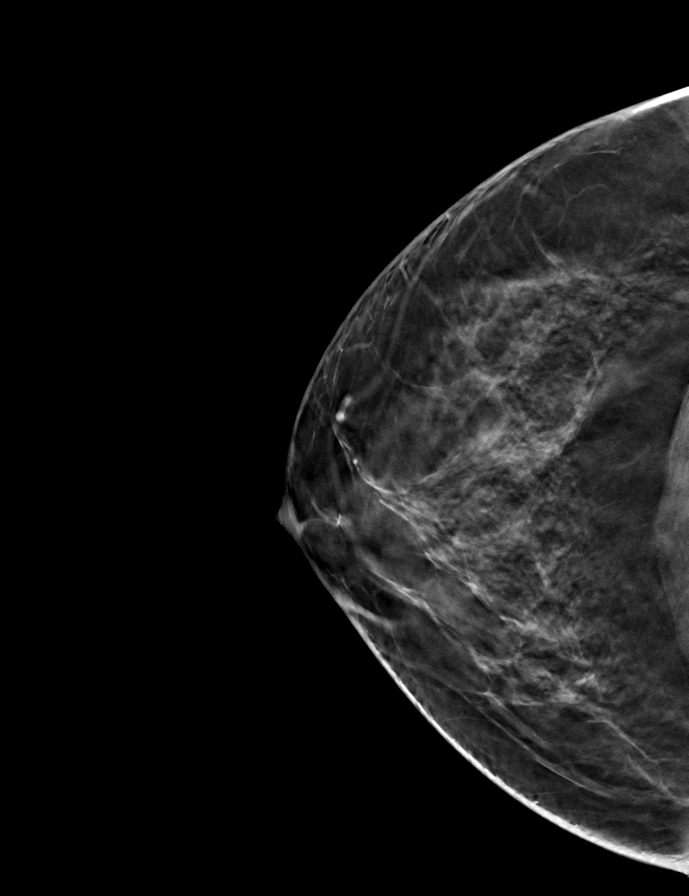

[9 of 24 positions shown; findings below may reference images not displayed]

ACR Breast Density Category c: The breast tissue is heterogeneously
dense, which may obscure small masses.
FINDINGS: There are no findings suspicious for malignancy.
IMPRESSION: No mammographic evidence of malignancy. A result letter of this
screening mammogram will be mailed directly to the patient.

RECOMMENDATION:
Screening mammogram in one year. (Code:[id number])

BI-RADS CATEGORY  1: Negative.

## 2022-06-03 ENCOUNTER — Encounter (HOSPITAL_COMMUNITY): Payer: Self-pay

## 2022-06-03 ENCOUNTER — Encounter (HOSPITAL_COMMUNITY)
Admission: RE | Admit: 2022-06-03 | Discharge: 2022-06-03 | Disposition: A | Discharge status: Home / Self Care | Payer: BC Managed Care – PPO | Source: Ambulatory Visit | Attending: Internal Medicine | Admitting: Internal Medicine

## 2022-06-03 ENCOUNTER — Other Ambulatory Visit: Payer: Self-pay

## 2022-06-03 VITALS — Ht 64.0 in | Wt 174.0 lb

## 2022-06-03 DIAGNOSIS — Z01818 Encounter for other preprocedural examination: Secondary | ICD-10-CM

## 2022-06-03 DIAGNOSIS — Z01812 Encounter for preprocedural laboratory examination: Secondary | ICD-10-CM | POA: Diagnosis present

## 2022-06-03 LAB — POCT PREGNANCY, URINE: Preg Test, Ur: NEGATIVE

## 2022-06-03 NOTE — Patient Instructions (Addendum)
Margaret Owen  06/03/2022     @PREFPERIOPPHARMACY$ @   Your procedure is scheduled on  06/08/2022.   Report to [facility] at  0730 A.M.   Call this number if you have problems the morning of surgery:  [phone]  If you experience any cold or flu symptoms such as cough, fever, chills, shortness of breath, etc. between now and your scheduled surgery, please notify us at the above number.   Remember:  Follow the diet and prep instructions given to you by the office.     Take these medicines the morning of surgery with A SIP OF WATER                                   Amlodipine.    Do not wear jewelry, make-up or nail polish.  Do not wear lotions, powders, or perfumes, or deodorant.  Do not shave 48 hours prior to surgery.  Men may shave face and neck.  Do not bring valuables to the hospital.  [facility] is not responsible for any belongings or valuables.  Contacts, dentures or bridgework may not be worn into surgery.  Leave your suitcase in the car.  After surgery it may be brought to your room.  For patients admitted to the hospital, discharge time will be determined by your treatment team.  Patients discharged the day of surgery will not be allowed to drive home and must have someone with them for 24 hours.    Special instructions:   DO NOT smoke tobacco or vape for 24 hours before your procedure.  Please read over the following fact sheets that you were given. Anesthesia Post-op Instructions and Care and Recovery After Surgery       Colonoscopy, Adult, Care After The following information offers guidance on how to care for yourself after your procedure. Your health care provider may also give you more specific instructions. If you have problems or questions, contact your health care provider. What can I expect after the procedure? After the procedure, it is common to have: A small amount of blood in your stool for 24 hours after the procedure. Some  gas. Mild cramping or bloating of your abdomen. Follow these instructions at home: Eating and drinking  Drink enough fluid to keep your urine pale yellow. Follow instructions from your health care provider about eating or drinking restrictions. Resume your normal diet as told by your health care provider. Avoid heavy or fried foods that are hard to digest. Activity Rest as told by your health care provider. Avoid sitting for a long time without moving. Get up to take short walks every 1-2 hours. This is important to improve blood flow and breathing. Ask for help if you feel weak or unsteady. Return to your normal activities as told by your health care provider. Ask your health care provider what activities are safe for you. Managing cramping and bloating  Try walking around when you have cramps or feel bloated. If directed, apply heat to your abdomen as told by your health care provider. Use the heat source that your health care provider recommends, such as a moist heat pack or a heating pad. Place a towel between your skin and the heat source. Leave the heat on for 20-30 minutes. Remove the heat if your skin turns bright red. This is especially important if you are unable to feel pain,  heat, or cold. You have a greater risk of getting burned. General instructions If you were given a sedative during the procedure, it can affect you for several hours. Do not drive or operate machinery until your health care provider says that it is safe. For the first 24 hours after the procedure: Do not sign important documents. Do not drink alcohol. Do your regular daily activities at a slower pace than normal. Eat soft foods that are easy to digest. Take over-the-counter and prescription medicines only as told by your health care provider. Keep all follow-up visits. This is important. Contact a health care provider if: You have blood in your stool 2-3 days after the procedure. Get help right away  if: You have more than a small spotting of blood in your stool. You have large blood clots in your stool. You have swelling of your abdomen. You have nausea or vomiting. You have a fever. You have increasing pain in your abdomen that is not relieved with medicine. These symptoms may be an emergency. Get help right away. Call 911. Do not wait to see if the symptoms will go away. Do not drive yourself to the hospital. Summary After the procedure, it is common to have a small amount of blood in your stool. You may also have mild cramping and bloating of your abdomen. If you were given a sedative during the procedure, it can affect you for several hours. Do not drive or operate machinery until your health care provider says that it is safe. Get help right away if you have a lot of blood in your stool, nausea or vomiting, a fever, or increased pain in your abdomen. This information is not intended to replace advice given to you by your health care provider. Make sure you discuss any questions you have with your health care provider. Document Revised: 11/27/2020 Document Reviewed: 11/27/2020 Elsevier Patient Education  [place] After The following information offers guidance on how to care for yourself after your procedure. Your health care provider may also give you more specific instructions. If you have problems or questions, contact your health care provider. What can I expect after the procedure? After the procedure, it is common to have: Tiredness. Little or no memory about what happened during or after the procedure. Impaired judgment when it comes to making decisions. Nausea or vomiting. Some trouble with balance. Follow these instructions at home: For the time period you were told by your health care provider:  Rest. Do not participate in activities where you could fall or become injured. Do not drive or use machinery. Do not drink  alcohol. Do not take sleeping pills or medicines that cause drowsiness. Do not make important decisions or sign legal documents. Do not take care of children on your own. Medicines Take over-the-counter and prescription medicines only as told by your health care provider. If you were prescribed antibiotics, take them as told by your health care provider. Do not stop using the antibiotic even if you start to feel better. Eating and drinking Follow instructions from your health care provider about what you may eat and drink. Drink enough fluid to keep your urine pale yellow. If you vomit: Drink clear fluids slowly and in small amounts as you are able. Clear fluids include water, ice chips, low-calorie sports drinks, and fruit juice that has water added to it (diluted fruit juice). Eat light and bland foods in small amounts as you are able. These foods  include bananas, applesauce, rice, lean meats, toast, and crackers. General instructions  Have a responsible adult stay with you for the time you are told. It is important to have someone help care for you until you are awake and alert. If you have sleep apnea, surgery and some medicines can increase your risk for breathing problems. Follow instructions from your health care provider about wearing your sleep device: When you are sleeping. This includes during daytime naps. While taking prescription pain medicines, sleeping medicines, or medicines that make you drowsy. Do not use any products that contain nicotine or tobacco. These products include cigarettes, chewing tobacco, and vaping devices, such as e-cigarettes. If you need help quitting, ask your health care provider. Contact a health care provider if: You feel nauseous or vomit every time you eat or drink. You feel light-headed. You are still sleepy or having trouble with balance after 24 hours. You get a rash. You have a fever. You have redness or swelling around the IV site. Get help  right away if: You have trouble breathing. You have new confusion after you get home. These symptoms may be an emergency. Get help right away. Call 911. Do not wait to see if the symptoms will go away. Do not drive yourself to the hospital. This information is not intended to replace advice given to you by your health care provider. Make sure you discuss any questions you have with your health care provider. Document Revised: 09/01/2021 Document Reviewed: 09/01/2021 Elsevier Patient Education  [place].

## 2022-06-08 ENCOUNTER — Encounter (HOSPITAL_COMMUNITY): Payer: Self-pay

## 2022-06-08 ENCOUNTER — Other Ambulatory Visit: Payer: Self-pay

## 2022-06-08 ENCOUNTER — Ambulatory Visit (HOSPITAL_COMMUNITY)
Admission: RE | Admit: 2022-06-08 | Discharge: 2022-06-08 | Disposition: A | Discharge status: Home / Self Care | Payer: BC Managed Care – PPO | Attending: Internal Medicine | Admitting: Internal Medicine

## 2022-06-08 ENCOUNTER — Ambulatory Visit (HOSPITAL_COMMUNITY): Payer: BC Managed Care – PPO | Admitting: Anesthesiology

## 2022-06-08 ENCOUNTER — Encounter (HOSPITAL_COMMUNITY)
Admission: RE | Disposition: A | Discharge status: Home / Self Care | Payer: Self-pay | Source: Home / Self Care | Attending: Internal Medicine

## 2022-06-08 DIAGNOSIS — D759 Disease of blood and blood-forming organs, unspecified: Secondary | ICD-10-CM | POA: Insufficient documentation

## 2022-06-08 DIAGNOSIS — K648 Other hemorrhoids: Secondary | ICD-10-CM | POA: Insufficient documentation

## 2022-06-08 DIAGNOSIS — D649 Anemia, unspecified: Secondary | ICD-10-CM | POA: Diagnosis not present

## 2022-06-08 DIAGNOSIS — C7A025 Malignant carcinoid tumor of the sigmoid colon: Secondary | ICD-10-CM | POA: Diagnosis not present

## 2022-06-08 DIAGNOSIS — I10 Essential (primary) hypertension: Secondary | ICD-10-CM | POA: Diagnosis not present

## 2022-06-08 DIAGNOSIS — Z1211 Encounter for screening for malignant neoplasm of colon: Secondary | ICD-10-CM | POA: Diagnosis present

## 2022-06-08 DIAGNOSIS — Z8673 Personal history of transient ischemic attack (TIA), and cerebral infarction without residual deficits: Secondary | ICD-10-CM | POA: Diagnosis not present

## 2022-06-08 HISTORY — PX: COLONOSCOPY WITH PROPOFOL: SHX5780

## 2022-06-08 HISTORY — PX: POLYPECTOMY: SHX5525

## 2022-06-08 SURGERY — COLONOSCOPY WITH PROPOFOL
Anesthesia: General

## 2022-06-08 MED ORDER — PROPOFOL 10 MG/ML IV BOLUS
INTRAVENOUS | Status: DC | PRN
  Administered 2022-06-08: 60 mg via INTRAVENOUS

## 2022-06-08 MED ORDER — PROPOFOL 500 MG/50ML IV EMUL
INTRAVENOUS | Status: DC | PRN
  Administered 2022-06-08: 150 ug/kg/min via INTRAVENOUS

## 2022-06-08 MED ORDER — PROPOFOL 500 MG/50ML IV EMUL
INTRAVENOUS | Status: AC
  Filled 2022-06-08: qty 50

## 2022-06-08 MED ORDER — LACTATED RINGERS IV SOLN: INTRAVENOUS | Status: DC

## 2022-06-08 MED ORDER — LACTATED RINGERS IV SOLN: INTRAVENOUS | Status: DC | PRN

## 2022-06-08 NOTE — Discharge Instructions (Addendum)
  Colonoscopy Discharge Instructions  Read the instructions outlined below and refer to this sheet in the next few weeks. These discharge instructions provide you with general information on caring for yourself after you leave the hospital. Your doctor may also give you specific instructions. While your treatment has been planned according to the most current medical practices available, unavoidable complications occasionally occur.   ACTIVITY You may resume your regular activity, but move at a slower pace for the next 24 hours.  Take frequent rest periods for the next 24 hours.  Walking will help get rid of the air and reduce the bloated feeling in your belly (abdomen).  No driving for 24 hours (because of the medicine (anesthesia) used during the test).   Do not sign any important legal documents or operate any machinery for 24 hours (because of the anesthesia used during the test).  NUTRITION Drink plenty of fluids.  You may resume your normal diet as instructed by your doctor.  Begin with a light meal and progress to your normal diet. Heavy or fried foods are harder to digest and may make you feel sick to your stomach (nauseated).  Avoid alcoholic beverages for 24 hours or as instructed.  MEDICATIONS You may resume your normal medications unless your doctor tells you otherwise.  WHAT YOU CAN EXPECT TODAY Some feelings of bloating in the abdomen.  Passage of more gas than usual.  Spotting of blood in your stool or on the toilet paper.  IF YOU HAD POLYPS REMOVED DURING THE COLONOSCOPY: No aspirin products for 7 days or as instructed.  No alcohol for 7 days or as instructed.  Eat a soft diet for the next 24 hours.  FINDING OUT THE RESULTS OF YOUR TEST Not all test results are available during your visit. If your test results are not back during the visit, make an appointment with your caregiver to find out the results. Do not assume everything is normal if you have not heard from your  caregiver or the medical facility. It is important for you to follow up on all of your test results.  SEEK IMMEDIATE MEDICAL ATTENTION IF: You have more than a spotting of blood in your stool.  Your belly is swollen (abdominal distention).  You are nauseated or vomiting.  You have a temperature over 101.  You have abdominal pain or discomfort that is severe or gets worse throughout the day.   Your colonoscopy revealed 2 polyp(s) which I removed successfully. Await pathology results, my office will contact you. I recommend repeating colonoscopy in 7-10 years for surveillance purposes depending on pathology results.    Otherwise follow up with GI as needed.   I hope you have a great rest of your week!  Elon Alas. Abbey Chatters, D.O. Gastroenterology and Hepatology [facility]

## 2022-06-08 NOTE — Op Note (Signed)
[facility] Patient Name: Margaret Owen Procedure Date: 06/08/2022 9:08 AM MRN: [id number] Date of Birth: 1976/01/30 Attending MD: Elon Alas. Abbey Chatters , [place], [id number] CSN: [id number] Age: 47 Admit Type: Outpatient Procedure:                Colonoscopy Indications:              Screening for colorectal malignant neoplasm Providers:                Elon Alas. Abbey Chatters, DO, Lambert Mody, Aram Candela Referring MD:              Medicines:                See the Anesthesia note for documentation of the                            administered medications Complications:            No immediate complications. Estimated Blood Loss:     Estimated blood loss was minimal. Procedure:                Pre-Anesthesia Assessment:                           - The anesthesia plan was to use monitored                            anesthesia care (MAC).                           After obtaining informed consent, the colonoscope                            was passed under direct vision. Throughout the                            procedure, the patient's blood pressure, pulse, and                            oxygen saturations were monitored continuously. The                            [id number]-[id number] scope was introduced through                            the anus and advanced to the the cecum, identified                            by appendiceal orifice and ileocecal valve. The                            colonoscopy was performed without difficulty. The                            patient tolerated the procedure well. The quality  of the bowel preparation was evaluated using the                            BBPS [facility] Bowel Preparation Scale) with scores                            of: Right Colon = 3, Transverse Colon = 3 and Left                            Colon = 3 (entire mucosa seen well with no residual                            staining,  small fragments of stool or opaque                            liquid). The total BBPS score equals 9. Scope In: 9:22:49 AM Scope Out: 9:37:03 AM Scope Withdrawal Time: 0 hours 12 minutes 2 seconds  Total Procedure Duration: 0 hours 14 minutes 14 seconds  Findings:      The perianal and digital rectal examinations were normal.      Non-bleeding internal hemorrhoids were found during endoscopy.      A 6 mm polyp was found in the sigmoid colon. The polyp was sessile. The       polyp was removed with a cold snare. Resection and retrieval were       complete.      The exam was otherwise without abnormality. Impression:               - Non-bleeding internal hemorrhoids.                           - One 6 mm polyp in the sigmoid colon, removed with                            a cold snare. Resected and retrieved.                           - The examination was otherwise normal. Moderate Sedation:      Per Anesthesia Care Recommendation:           - Patient has a contact number available for                            emergencies. The signs and symptoms of potential                            delayed complications were discussed with the                            patient. Return to normal activities tomorrow.                            Written discharge instructions were provided to the  patient.                           - Resume previous diet.                           - Continue present medications.                           - Await pathology results.                           - Repeat colonoscopy in 7-10 years for surveillance.                           - Return to GI clinic PRN. Procedure Code(s):        --- Professional ---                           [phone], Colonoscopy, flexible; with removal of                            tumor(s), polyp(s), or other lesion(s) by snare                            technique Diagnosis Code(s):        --- Professional ---                            Z12.11, Encounter for screening for malignant                            neoplasm of colon                           D12.5, Benign neoplasm of sigmoid colon                           K64.8, Other hemorrhoids CPT copyright 2022 American Medical Association. All rights reserved. The codes documented in this report are preliminary and upon coder review may  be revised to meet current compliance requirements. Elon Alas. Abbey Chatters, DO [place] Abbey Chatters, DO 06/08/2022 9:39:02 AM This report has been signed electronically. Number of Addenda: 0

## 2022-06-08 NOTE — Anesthesia Preprocedure Evaluation (Signed)
Anesthesia Evaluation  Patient identified by MRN, date of birth, ID band Patient awake    Reviewed: Allergy & Precautions, H&P , NPO status , Patient's Chart, lab work & pertinent test results, reviewed documented beta blocker date and time   Airway Mallampati: II  TM Distance: >3 FB Neck ROM: full    Dental no notable dental hx.    Pulmonary neg pulmonary ROS   Pulmonary exam normal breath sounds clear to auscultation       Cardiovascular Exercise Tolerance: Good hypertension, negative cardio ROS  Rhythm:regular Rate:Normal     Neuro/Psych TIA Neuromuscular disease negative neurological ROS  negative psych ROS   GI/Hepatic negative GI ROS, Neg liver ROS,,,  Endo/Other  negative endocrine ROS    Renal/GU negative Renal ROS  negative genitourinary   Musculoskeletal   Abdominal   Peds  Hematology negative hematology ROS (+) Blood dyscrasia, anemia   Anesthesia Other Findings   Reproductive/Obstetrics negative OB ROS                             Anesthesia Physical Anesthesia Plan  ASA: 2  Anesthesia Plan: General   Post-op Pain Management:    Induction:   PONV Risk Score and Plan: Propofol infusion  Airway Management Planned:   Additional Equipment:   Intra-op Plan:   Post-operative Plan:   Informed Consent: I have reviewed the patients History and Physical, chart, labs and discussed the procedure including the risks, benefits and alternatives for the proposed anesthesia with the patient or authorized representative who has indicated his/her understanding and acceptance.     Dental Advisory Given  Plan Discussed with: CRNA  Anesthesia Plan Comments:        Anesthesia Quick Evaluation

## 2022-06-08 NOTE — Anesthesia Procedure Notes (Signed)
Procedure Name: General with mask airway Date/Time: 06/08/2022 9:21 AM  Performed by: Maude Leriche, CRNAPre-anesthesia Checklist: Patient identified, Emergency Drugs available, Suction available, Patient being monitored and Timeout performed Patient Re-evaluated:Patient Re-evaluated prior to induction Oxygen Delivery Method: Nasal cannula Induction Type: IV induction

## 2022-06-08 NOTE — H&P (Signed)
Primary Care Physician:  Lindell Spar, MD Primary Gastroenterologist:  Dr. Abbey Chatters  Pre-Procedure History & Physical: HPI:  Margaret Owen is a 47 y.o. female is here for first ever colonoscopy for colon cancer screening purposes.    Past Medical History:  Diagnosis Date   Bell palsy    when she was a teenager   Breakthrough bleeding associated with intrauterine device (IUD) 03/22/2017   Hypertension    IUD contraception 10/05/2016   Malpositioned IUD 04/14/2017   REmoved 04/14/17    MIGRAINES, HX OF 06/11/2008   Qualifier: Diagnosis of  By: Truett Mainland MD, Christine     Pedal edema 10/05/2016   TIA (transient ischemic attack)     Past Surgical History:  Procedure Laterality Date   CERVICAL CERCLAGE     CESAREAN SECTION     2000,2003,2005   CESAREAN SECTION  08/17/1998   TEE WITHOUT CARDIOVERSION N/A 07/08/2021   Procedure: TRANSESOPHAGEAL ECHOCARDIOGRAM (TEE);  Surgeon: Werner Lean, MD;  Location: [facility] ENDOSCOPY;  Service: Cardiovascular;  Laterality: N/A;  being done in cath lab holding    Prior to Admission medications   Medication Sig Start Date End Date Taking? Authorizing Provider  acetaminophen (TYLENOL) 500 MG tablet Take 500 mg by mouth every 6 (six) hours as needed for moderate pain or mild pain.   Yes [provider]  amLODipine (NORVASC) 10 MG tablet TAKE 1 TABLET(10 MG) BY MOUTH DAILY 02/09/22  Yes Lindell Spar, MD  aspirin EC 81 MG EC tablet Take 1 tablet (81 mg total) by mouth daily. Swallow whole. 07/09/21  Yes Pahwani, Einar Grad, MD  Cyanocobalamin (VITAMIN B-12 PO) Take 1 tablet by mouth daily.   Yes [provider]  docusate sodium (STOOL SOFTENER) 100 MG capsule Take 200 mg by mouth in the morning.   Yes [provider]  ferrous sulfate 325 (65 FE) MG tablet Take 325 mg by mouth every other day.   Yes [provider]  Na Sulfate-K Sulfate-Mg Sulf 17.5-3.13-1.6 GM/177ML SOLN As directed 05/22/22  Yes Yassmine Tamm K,  DO  Naphazoline-Pheniramine (VISINE-A OP) Place 1 drop into both eyes daily as needed (dry eyes/redness).   Yes [provider]  potassium chloride (KLOR-CON) 10 MEQ tablet Take 1 tablet (10 mEq total) by mouth daily. 03/17/22 06/02/22 Yes Trifan, Carola Rhine, MD  rosuvastatin (CRESTOR) 20 MG tablet Take 1 tablet (20 mg total) by mouth daily. 08/06/21  Yes Lindell Spar, MD  VITAMIN D PO Take 1 tablet by mouth daily.   Yes [provider]  ZAFEMY 150-35 MCG/24HR transdermal patch APPLY 1 PATCH TOPICALLY TO THE SKIN 1 TIME A WEEK 12/04/21  Yes Cresenzo-Dishmon, Joaquim Lai, CNM  predniSONE (DELTASONE) 20 MG tablet Take 20 mg by mouth daily with breakfast. 06/01/22   [provider]    Allergies as of 05/22/2022   (No Known Allergies)    Family History  Problem Relation Age of Onset   Heart failure Father    Alcohol abuse Father    Heart disease Father 16       died at 34   Hypertension Father    Stroke Mother    Hypertension Mother    Arthritis Mother    Asthma Mother    Diabetes Mother    Hyperlipidemia Mother    Glaucoma Mother    Breast cancer Maternal Aunt    Colon cancer Maternal Aunt    Stroke Maternal Aunt    Glaucoma Sister    Asthma Brother  Social History   Socioeconomic History   Marital status: Married    Spouse name: Harrell Gave   Number of children: 3   Years of education: 16   Highest education level: Not on file  Occupational History   Occupation: Artist    Comment: state of [place]  Tobacco Use   Smoking status: Never   Smokeless tobacco: Never  Vaping Use   Vaping Use: Never used  Substance and Sexual Activity   Alcohol use: Yes    Comment: occasional   Drug use: No   Sexual activity: Yes    Birth control/protection: Patch  Other Topics Concern   Not on file  Social History Narrative   Husband is a [employer] with husband Christopher-married 74 years   Three daughters: all live at home 31, 40,15    Works for  [employer], Art gallery manager      Dog: Hydrographic surveyor       Enjoys: gardening, flowers      Diet: eat all food groups   Caffeine: 2 daily   Water: 2 bottles daily       Wears seat belt    Does not use phone while driving    Oceanographer at home    Social Determinants of Health   Financial Resource Strain: [place]  (07/27/2019)   Overall Financial Resource Strain (CARDIA)    Difficulty of Paying Living Expenses: Not hard at all  Food Insecurity: No Food Insecurity (07/27/2019)   Hunger Vital Sign    Worried About Running Out of Food in the Last Year: Never true    [place] in the Last Year: Never true  Transportation Needs: No Transportation Needs (07/27/2019)   PRAPARE - Hydrologist (Medical): No    Lack of Transportation (Non-Medical): No  Physical Activity: Inactive (07/27/2019)   Exercise Vital Sign    Days of Exercise per Week: 0 days    Minutes of Exercise per Session: 0 min  Stress: No Stress Concern Present (07/27/2019)   [place]    Feeling of Stress : Only a little  Social Connections: Moderately Isolated (07/27/2019)   Social Connection and Isolation Panel [NHANES]    Frequency of Communication with Friends and Family: More than three times a week    Frequency of Social Gatherings with Friends and Family: More than three times a week    Attends Religious Services: Never    Marine scientist or Organizations: No    Attends Archivist Meetings: Never    Marital Status: Married  Human resources officer Violence: Not At Risk (07/27/2019)   Humiliation, Afraid, Rape, and Kick questionnaire    Fear of Current or Ex-Partner: No    Emotionally Abused: No    Physically Abused: No    Sexually Abused: No    Review of Systems: See HPI, otherwise negative ROS  Physical Exam: Vital signs in last 24 hours: Temp:  [97.6 F (36.4 C)] 97.6 F (36.4 C) (02/19 0806) Pulse  Rate:  [76] 76 (02/19 0806) Resp:  [16] 16 (02/19 0806) BP: (141)/(91) 141/91 (02/19 0806) SpO2:  [99 %] 99 % (02/19 0806)   General:   Alert,  Well-developed, well-nourished, pleasant and cooperative in NAD Head:  Normocephalic and atraumatic. Eyes:  Sclera clear, no icterus.   Conjunctiva pink. Ears:  Normal auditory acuity. Nose:  No deformity, discharge,  or lesions. Msk:  Symmetrical without gross deformities. Normal posture. Extremities:  Without clubbing or edema. Neurologic:  Alert and  oriented x4;  grossly normal neurologically. Skin:  Intact without significant lesions or rashes. Psych:  Alert and cooperative. Normal mood and affect.  Impression/Plan: Margaret Owen is here for a colonoscopy to be performed for colon cancer screening purposes.  The risks of the procedure including infection, bleed, or perforation as well as benefits, limitations, alternatives and imponderables have been reviewed with the patient. Questions have been answered. All parties agreeable.

## 2022-06-08 NOTE — Transfer of Care (Signed)
Immediate Anesthesia Transfer of Care Note  Patient: Margaret Owen  Procedure(s) Performed: COLONOSCOPY WITH PROPOFOL POLYPECTOMY  Patient Location: PACU  Anesthesia Type:General  Level of Consciousness: awake, alert , and oriented  Airway & Oxygen Therapy: Patient Spontanous Breathing  Post-op Assessment: Report given to RN, Post -op Vital signs reviewed and stable, Patient moving all extremities X 4, and Patient able to stick tongue midline  Post vital signs: Reviewed  Last Vitals:  Vitals Value Taken Time  BP 114/60 06/08/22 0941  Temp 36.4 C 06/08/22 0941  Pulse 75 06/08/22 0941  Resp 16 06/08/22 0941  SpO2 100 % 06/08/22 0941    Last Pain:  Vitals:   06/08/22 0941  TempSrc: Axillary  PainSc: 0-No pain         Complications: No notable events documented.

## 2022-06-08 NOTE — Anesthesia Postprocedure Evaluation (Signed)
Anesthesia Post Note  Patient: Margaret Owen  Procedure(s) Performed: COLONOSCOPY WITH PROPOFOL POLYPECTOMY  Patient location during evaluation: Phase II Anesthesia Type: General Level of consciousness: awake Pain management: pain level controlled Vital Signs Assessment: post-procedure vital signs reviewed and stable Respiratory status: spontaneous breathing and respiratory function stable Cardiovascular status: blood pressure returned to baseline and stable Postop Assessment: no headache and no apparent nausea or vomiting Anesthetic complications: no Comments: Late entry   No notable events documented.   Last Vitals:  Vitals:   06/08/22 0806 06/08/22 0941  BP: (!) 141/91 114/60  Pulse: 76 75  Resp: 16 16  Temp: 36.4 C 36.4 C  SpO2: 99% 100%    Last Pain:  Vitals:   06/08/22 0941  TempSrc: Axillary  PainSc: 0-No pain                 Louann Sjogren

## 2022-06-09 ENCOUNTER — Telehealth: Payer: Self-pay | Admitting: *Deleted

## 2022-06-09 NOTE — Telephone Encounter (Signed)
Pt called and left vm that she hadn't stopped taking her iron medication like she was supposed to but she said she looked in her pill pack and she had actually stopped taking them. Pt did have her colonoscopy done yesterday.

## 2022-06-12 LAB — SURGICAL PATHOLOGY

## 2022-06-15 ENCOUNTER — Encounter (HOSPITAL_COMMUNITY): Payer: Self-pay | Admitting: Internal Medicine

## 2022-06-22 ENCOUNTER — Telehealth (INDEPENDENT_AMBULATORY_CARE_PROVIDER_SITE_OTHER): Payer: Self-pay | Admitting: *Deleted

## 2022-06-22 NOTE — Telephone Encounter (Signed)
Spoke with pt and she is requesting to speak with Dr. Abbey Chatters personally prior to scheduling her flex sig and about possibly delaying it. She asked for you to call her. Thanks!

## 2022-06-24 NOTE — Telephone Encounter (Signed)
I attempted to call patient, no answer

## 2022-06-26 NOTE — Telephone Encounter (Signed)
noted 

## 2022-08-14 ENCOUNTER — Other Ambulatory Visit: Payer: Self-pay | Admitting: Internal Medicine

## 2022-08-14 DIAGNOSIS — G459 Transient cerebral ischemic attack, unspecified: Secondary | ICD-10-CM

## 2022-08-14 DIAGNOSIS — I10 Essential (primary) hypertension: Secondary | ICD-10-CM

## 2022-08-14 DIAGNOSIS — E782 Mixed hyperlipidemia: Secondary | ICD-10-CM

## 2022-09-29 ENCOUNTER — Other Ambulatory Visit (HOSPITAL_COMMUNITY)
Admission: RE | Admit: 2022-09-29 | Discharge: 2022-09-29 | Disposition: A | Discharge status: Home / Self Care | Payer: BC Managed Care – PPO | Source: Ambulatory Visit | Attending: Nurse Practitioner | Admitting: Nurse Practitioner

## 2022-09-29 DIAGNOSIS — Z124 Encounter for screening for malignant neoplasm of cervix: Secondary | ICD-10-CM | POA: Diagnosis not present

## 2022-10-01 ENCOUNTER — Telehealth: Payer: Self-pay | Admitting: Physician Assistant

## 2022-10-01 NOTE — Telephone Encounter (Signed)
Patient is aware of upcoming appointment dates/times.  

## 2022-10-02 LAB — CYTOLOGY - PAP
Comment: NEGATIVE
Diagnosis: NEGATIVE
High risk HPV: NEGATIVE

## 2022-10-20 ENCOUNTER — Other Ambulatory Visit: Payer: Self-pay

## 2022-10-25 NOTE — Progress Notes (Unsigned)
[facility] OFFICE PROGRESS NOTE  [name], MD [address] [place] [address]  DIAGNOSIS:  1) history of iron deficiency anemia secondary to menorrhagia and vitamin B12 deficiency. 2) nonspecific acquired protein S deficienc  PRIOR THERAPY: 1) Iron infusion with Venofer 300 mg IV weekly for 3 weeks., most recent dose on 08/14/21 2) Monthly vitamin B12 injection 1000 mcg intramuscular x4, most recent 08/29/21, was receiving at her PCPs office  CURRENT THERAPY: Ferrous sulfate 325 mg p.o. daily in addition to vitamin B12 oral supplement daily.   INTERVAL HISTORY: Margaret Owen 47 y.o. female returns to the clinic today for follow-up visit.  She was lost to follow-up since 12/03/2021.  The patient was initially referred to the clinic for iron deficiency anemia, B12 deficiency, and nonspecific acquired protein S deficiency.  She did receive IV iron infusions with Venofer weekly x 3 the most recent dose in April 2023.  She also was receiving B12 monthly injections, the most recent on 08/29/2021 but had since opted to ***take over-the-counter B12 which she is ***was reciecing at her PCPs office I still taking this?  Iron supplement?  Still taking?  She was rereferred to the clinic by her OB/GYN for which she had an appointment on 09/29/2022 for an annual exam.  She is currently being worked up for abnormal uterine bleeding and they recommended transvaginal ultrasound with endometrial biopsy to assess her fibroids to rule out possible malignancy.  She is expected to have this on ***.  Given her history of iron deficiency anemia, they arrange for repeat CBC and ferritin.  Her CBC showed microcytic anemia with a hemoglobin of 8.5, elevated RDW at 18.2, low MCV at 66.  Her ferritin was low at 2.6.  Ever have colonoscopy? Last appointment with me last year told me she was supposed to have upcoming 06/08/22 Dr. Marletta Lor ***Report wont open   The patient denies any fever, chills,  night sweats, unexplained weight loss.  Fatigue?  She denies any shortness of breath, chest pain, lightheadedness, syncope, or palpitations.  She denies any abnormal bleeding or bruising including epistaxis, gingival bleeding, hemoptysis, hematemesis, melena, or hematochezia.  She is here today for repeat blood work and for more detailed discussion about her current condition and treatment options.  MEDICAL HISTORY: Past Medical History:  Diagnosis Date   Bell palsy    when she was a teenager   Breakthrough bleeding associated with intrauterine device (IUD) 03/22/2017   Hypertension    IUD contraception 10/05/2016   Malpositioned IUD 04/14/2017   REmoved 04/14/17    MIGRAINES, HX OF 06/11/2008   Qualifier: Diagnosis of  By: Jen Mow MD, Christine     Pedal edema 10/05/2016   TIA (transient ischemic attack)     ALLERGIES:  has No Known Allergies.  MEDICATIONS:  Current Outpatient Medications  Medication Sig Dispense Refill   acetaminophen (TYLENOL) 500 MG tablet Take 500 mg by mouth every 6 (six) hours as needed for moderate pain or mild pain.     amLODipine (NORVASC) 10 MG tablet TAKE 1 TABLET(10 MG) BY MOUTH DAILY 90 tablet 1   aspirin EC 81 MG EC tablet Take 1 tablet (81 mg total) by mouth daily. Swallow whole. 30 tablet 1   Cyanocobalamin (VITAMIN B-12 PO) Take 1 tablet by mouth daily.     docusate sodium (STOOL SOFTENER) 100 MG capsule Take 200 mg by mouth in the morning.     ferrous sulfate 325 (65 FE) MG tablet  Take 325 mg by mouth every other day.     Naphazoline-Pheniramine (VISINE-A OP) Place 1 drop into both eyes daily as needed (dry eyes/redness).     potassium chloride (KLOR-CON) 10 MEQ tablet Take 1 tablet (10 mEq total) by mouth daily. 30 tablet 0   predniSONE (DELTASONE) 20 MG tablet Take 20 mg by mouth daily with breakfast.     rosuvastatin (CRESTOR) 20 MG tablet TAKE 1 TABLET(20 MG) BY MOUTH DAILY 90 tablet 3   VITAMIN D PO Take 1 tablet by mouth daily.     ZAFEMY  150-35 MCG/24HR transdermal patch APPLY 1 PATCH TOPICALLY TO THE SKIN 1 TIME A WEEK 12 patch 3   No current facility-administered medications for this visit.    SURGICAL HISTORY:  Past Surgical History:  Procedure Laterality Date   CERVICAL CERCLAGE     CESAREAN SECTION     [phone]   CESAREAN SECTION  08/17/1998   COLONOSCOPY WITH PROPOFOL N/A 06/08/2022   Procedure: COLONOSCOPY WITH PROPOFOL;  Surgeon: Lanelle Bal, DO;  [facility];  Service: Endoscopy;  Laterality: N/A;  9:30AM, ASA 3   POLYPECTOMY  06/08/2022   Procedure: POLYPECTOMY;  Surgeon: Lanelle Bal, DO;  Location: [facility];  Service: Endoscopy;;   TEE WITHOUT CARDIOVERSION N/A 07/08/2021   Procedure: TRANSESOPHAGEAL ECHOCARDIOGRAM (TEE);  Surgeon: Christell Constant, MD;  Location: [facility] ENDOSCOPY;  Service: Cardiovascular;  Laterality: N/A;  being done in cath lab holding    REVIEW OF SYSTEMS:   Review of Systems  Constitutional: Negative for appetite change, chills, fatigue, fever and unexpected weight change.  HENT:   Negative for mouth sores, nosebleeds, sore throat and trouble swallowing.   Eyes: Negative for eye problems and icterus.  Respiratory: Negative for cough, hemoptysis, shortness of breath and wheezing.   Cardiovascular: Negative for chest pain and leg swelling.  Gastrointestinal: Negative for abdominal pain, constipation, diarrhea, nausea and vomiting.  Genitourinary: Negative for bladder incontinence, difficulty urinating, dysuria, frequency and hematuria.   Musculoskeletal: Negative for back pain, gait problem, neck pain and neck stiffness.  Skin: Negative for itching and rash.  Neurological: Negative for dizziness, extremity weakness, gait problem, headaches, light-headedness and seizures.  Hematological: Negative for adenopathy. Does not bruise/bleed easily.  Psychiatric/Behavioral: Negative for confusion, depression and sleep disturbance. The patient is not  nervous/anxious.     PHYSICAL EXAMINATION:  There were no vitals taken for this visit.  ECOG PERFORMANCE STATUS: {CHL ONC ECOG [id number]  Physical Exam  Constitutional: Oriented to person, place, and time and well-developed, well-nourished, and in no distress. No distress.  HENT:  Head: Normocephalic and atraumatic.  Mouth/Throat: Oropharynx is clear and moist. No oropharyngeal exudate.  Eyes: Conjunctivae are normal. Right eye exhibits no discharge. Left eye exhibits no discharge. No scleral icterus.  Neck: Normal range of motion. Neck supple.  Cardiovascular: Normal rate, regular rhythm, normal heart sounds and intact distal pulses.   Pulmonary/Chest: Effort normal and breath sounds normal. No respiratory distress. No wheezes. No rales.  Abdominal: Soft. Bowel sounds are normal. Exhibits no distension and no mass. There is no tenderness.  Musculoskeletal: Normal range of motion. Exhibits no edema.  Lymphadenopathy:    No cervical adenopathy.  Neurological: Alert and oriented to person, place, and time. Exhibits normal muscle tone. Gait normal. Coordination normal.  Skin: Skin is warm and dry. No rash noted. Not diaphoretic. No erythema. No pallor.  Psychiatric: Mood, memory and judgment normal.  Vitals reviewed.  LABORATORY DATA: Lab Results  Component Value Date   WBC 8.2 04/30/2022   HGB 11.8 04/30/2022   HCT 37.2 04/30/2022   MCV 80 04/30/2022   PLT 248 04/30/2022      Chemistry      Component Value Date/Time   NA 135 04/30/2022 1642   K 4.0 04/30/2022 1642   CL 101 04/30/2022 1642   CO2 22 04/30/2022 1642   BUN 9 04/30/2022 1642   CREATININE 0.70 04/30/2022 1642   CREATININE 0.73 09/04/2021 0901   CREATININE 0.61 11/14/2019 0733      Component Value Date/Time   CALCIUM 9.3 04/30/2022 1642   ALKPHOS 48 10/31/2021 0809   AST 9 10/31/2021 0809   AST 11 (L) 09/04/2021 0901   ALT 10 10/31/2021 0809   ALT 10 09/04/2021 0901   BILITOT 0.2 10/31/2021 0809    BILITOT 0.3 09/04/2021 0901       RADIOGRAPHIC STUDIES:  No results found.   ASSESSMENT/PLAN:  This is a very pleasant 47 years old African-American female with history of iron deficiency anemia secondary to menorrhagia in addition to vitamin B12 deficiency and suspicious protein S deficiency that significantly improved on repeat protein S panel.    She was receiving B12 injection at her PCPs office the most recent in May 2023 but she is currently taking ***n B12.  She is also taking iron supplement with ***.  She received IV iron with Venofer 300 mg weekly x 3 the most recent dose was on 08/14/2021.  The patient was seen with Dr. Arbutus Ped today.  Labs were reviewed.  We will arrange for IV iron with Venofer 300 mg weekly x 3 at the [facility] infusion center.  She was instructed to continue to take her oral iron supplement p.o. daily.  She was encouraged to take this with vitamin C to help with iron absorption.  She was also given a handout of iron rich food.  She will also continue to follow with her GYN for her abnormal uterine bleeding. She is expected to have a transvaginal ultrasound on ***  The patient was advised to call immediately if she has any concerning symptoms in the interval. The patient voices understanding of current disease status and treatment options and is in agreement with the current care plan. All questions were answered. The patient knows to call the clinic with any problems, questions or concerns. We can certainly see the patient much sooner if necessary         No orders of the defined types were placed in this encounter.    I spent {CHL ONC TIME VISIT - ZOXWR:[id number]} counseling the patient face to face. The total time spent in the appointment was {CHL ONC TIME VISIT - BJYNW:[id number]}.  Karne Ozga L Godson Pollan, PA-C 10/25/22

## 2022-10-27 ENCOUNTER — Inpatient Hospital Stay (HOSPITAL_BASED_OUTPATIENT_CLINIC_OR_DEPARTMENT_OTHER): Payer: BC Managed Care – PPO | Admitting: Physician Assistant

## 2022-10-27 ENCOUNTER — Inpatient Hospital Stay: Payer: BC Managed Care – PPO | Attending: Physician Assistant

## 2022-10-27 ENCOUNTER — Other Ambulatory Visit: Payer: Self-pay

## 2022-10-27 ENCOUNTER — Other Ambulatory Visit: Payer: Self-pay | Admitting: Physician Assistant

## 2022-10-27 VITALS — BP 141/91 | HR 80 | Temp 98.9°F | Resp 16 | Wt 179.0 lb

## 2022-10-27 DIAGNOSIS — D3A00 Benign carcinoid tumor of unspecified site: Secondary | ICD-10-CM | POA: Diagnosis not present

## 2022-10-27 DIAGNOSIS — N920 Excessive and frequent menstruation with regular cycle: Secondary | ICD-10-CM | POA: Insufficient documentation

## 2022-10-27 DIAGNOSIS — E538 Deficiency of other specified B group vitamins: Secondary | ICD-10-CM | POA: Insufficient documentation

## 2022-10-27 DIAGNOSIS — D509 Iron deficiency anemia, unspecified: Secondary | ICD-10-CM

## 2022-10-27 DIAGNOSIS — D500 Iron deficiency anemia secondary to blood loss (chronic): Secondary | ICD-10-CM | POA: Insufficient documentation

## 2022-10-27 DIAGNOSIS — D6859 Other primary thrombophilia: Secondary | ICD-10-CM | POA: Diagnosis not present

## 2022-10-27 LAB — CBC WITH DIFFERENTIAL (CANCER CENTER ONLY)
Abs Immature Granulocytes: 0.03 10*3/uL (ref 0.00–0.07)
Basophils Absolute: 0.1 10*3/uL (ref 0.0–0.1)
Basophils Relative: 1 %
Eosinophils Absolute: 0.2 10*3/uL (ref 0.0–0.5)
Eosinophils Relative: 3 %
HCT: 31 % — ABNORMAL LOW (ref 36.0–46.0)
Hemoglobin: 9 g/dL — ABNORMAL LOW (ref 12.0–15.0)
Immature Granulocytes: 1 %
Lymphocytes Relative: 34 %
Lymphs Abs: 2.2 10*3/uL (ref 0.7–4.0)
MCH: 19.7 pg — ABNORMAL LOW (ref 26.0–34.0)
MCHC: 29 g/dL — ABNORMAL LOW (ref 30.0–36.0)
MCV: 67.7 fL — ABNORMAL LOW (ref 80.0–100.0)
Monocytes Absolute: 0.6 10*3/uL (ref 0.1–1.0)
Monocytes Relative: 8 %
Neutro Abs: 3.6 10*3/uL (ref 1.7–7.7)
Neutrophils Relative %: 53 %
Platelet Count: 326 10*3/uL (ref 150–400)
RBC: 4.58 MIL/uL (ref 3.87–5.11)
RDW: 18.4 % — ABNORMAL HIGH (ref 11.5–15.5)
WBC Count: 6.7 10*3/uL (ref 4.0–10.5)
nRBC: 0 % (ref 0.0–0.2)

## 2022-10-27 LAB — IRON AND IRON BINDING CAPACITY (CC-WL,HP ONLY)
Iron: 25 ug/dL — ABNORMAL LOW (ref 28–170)
Saturation Ratios: 5 % — ABNORMAL LOW (ref 10.4–31.8)
TIBC: 550 ug/dL — ABNORMAL HIGH (ref 250–450)
UIBC: 525 ug/dL — ABNORMAL HIGH (ref 148–442)

## 2022-10-27 LAB — SAMPLE TO BLOOD BANK

## 2022-10-27 LAB — FOLATE: Folate: 12.5 ng/mL (ref >5.9)

## 2022-10-27 LAB — VITAMIN B12: Vitamin B-12: 725 pg/mL (ref 180–914)

## 2022-10-28 ENCOUNTER — Encounter: Payer: Self-pay | Admitting: Physician Assistant

## 2022-10-28 LAB — FERRITIN: Ferritin: 4 ng/mL — ABNORMAL LOW (ref 11–307)

## 2022-10-29 ENCOUNTER — Telehealth: Payer: Self-pay

## 2022-10-29 ENCOUNTER — Encounter: Payer: Self-pay | Admitting: Physician Assistant

## 2022-10-29 ENCOUNTER — Encounter: Payer: BC Managed Care – PPO | Admitting: Internal Medicine

## 2022-10-29 NOTE — Telephone Encounter (Signed)
Auth Submission: NO AUTH NEEDED Site of care: Site of care: [facility] Payer: BCBS Medication & CPT/J Code(s) submitted: Venofer (Iron Sucrose) J1756 Route of submission (phone, fax, portal):  Phone # Fax # Auth type: Buy/Bill Units/visits requested: 300mg  x 3 doses, at least 7 days apart  Approval from: 10/29/2022 to 03/01/2023

## 2022-11-02 ENCOUNTER — Telehealth: Payer: Self-pay | Admitting: *Deleted

## 2022-11-02 NOTE — Telephone Encounter (Signed)
Will call pt once we receive August schedule to schedule

## 2022-11-02 NOTE — Telephone Encounter (Deleted)
Lanelle Bal, DO  Heilingoetter, Cassandra L, PA-C Cc: Armstead Peaks, CMA Sure I would be happy to! Brenn Gatton can we se this patient up for flexible sigmoidoscopy? ASA 3, does not need OV. thank yo

## 2022-11-02 NOTE — Telephone Encounter (Signed)
-----   Message from Lanelle Bal sent at 11/01/2022  8:51 PM EDT ----- Sure I would be happy to! Colen Eltzroth can we se this patient up for flexible sigmoidoscopy? ASA 3, does not need OV. thank you  [facility] ----- Message ----- From: Heilingoetter, Johnette Abraham, PA-C Sent: 10/27/2022   4:50 PM EDT To: Lanelle Bal, DO  Good Afternoon Dr. Marletta Lor,   I saw this patient today for follow up of her IDA due to menorrhagia. 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 She was overwhelmed at first but is now agreeable to follow up with you. She said she would call your office to facilitate follow up, but I was reaching out to assist with this. Would one of your staff members be able to call her and schedule the flexible sigmoidoscopy as you previously recommended? Thank you!  Cassie

## 2022-11-05 ENCOUNTER — Ambulatory Visit (INDEPENDENT_AMBULATORY_CARE_PROVIDER_SITE_OTHER): Payer: BC Managed Care – PPO

## 2022-11-05 VITALS — BP 132/77 | HR 64 | Temp 98.7°F | Resp 18 | Ht 65.0 in | Wt 183.6 lb

## 2022-11-05 DIAGNOSIS — D509 Iron deficiency anemia, unspecified: Secondary | ICD-10-CM

## 2022-11-05 MED ORDER — DIPHENHYDRAMINE HCL 25 MG PO CAPS
25.0000 mg | ORAL_CAPSULE | Freq: Once | ORAL | Status: AC
  Administered 2022-11-05: 25 mg via ORAL
  Filled 2022-11-05: qty 1

## 2022-11-05 MED ORDER — ACETAMINOPHEN 325 MG PO TABS
650.0000 mg | ORAL_TABLET | Freq: Once | ORAL | Status: AC
  Administered 2022-11-05: 500 mg via ORAL

## 2022-11-05 MED ORDER — SODIUM CHLORIDE 0.9 % IV SOLN
300.0000 mg | Freq: Once | INTRAVENOUS | Status: AC
  Administered 2022-11-05: 300 mg via INTRAVENOUS
  Filled 2022-11-05: qty 15

## 2022-11-05 NOTE — Progress Notes (Signed)
Diagnosis: Iron Deficiency Anemia  Provider:  Chilton Greathouse MD  Procedure: IV Infusion  IV Type: Peripheral, IV Location: L Antecubital  Venofer (Iron Sucrose), Dose: 300 mg  Infusion Start Time: 1417  Infusion Stop Time: 1559  Post Infusion IV Care: Observation period completed and Peripheral IV Discontinued  Discharge: Condition: Stable, Destination: Home . AVS Declined  Performed by:  Wyvonne Lenz, RN

## 2022-11-09 ENCOUNTER — Encounter: Payer: Self-pay | Admitting: *Deleted

## 2022-11-09 NOTE — Telephone Encounter (Signed)
Spoke with pt. Offered several dates in August. She scheduled for 8/26./ aware will send instructions/pre-op via mychart.

## 2022-11-13 ENCOUNTER — Ambulatory Visit: Payer: BC Managed Care – PPO

## 2022-11-13 VITALS — BP 138/87 | HR 83 | Temp 98.2°F | Resp 20 | Ht 65.0 in | Wt 186.6 lb

## 2022-11-13 DIAGNOSIS — D509 Iron deficiency anemia, unspecified: Secondary | ICD-10-CM | POA: Diagnosis not present

## 2022-11-13 MED ORDER — SODIUM CHLORIDE 0.9 % IV SOLN
300.0000 mg | Freq: Once | INTRAVENOUS | Status: AC
  Administered 2022-11-13: 300 mg via INTRAVENOUS
  Filled 2022-11-13: qty 15

## 2022-11-13 MED ORDER — DIPHENHYDRAMINE HCL 25 MG PO CAPS: 25.0000 mg | ORAL_CAPSULE | Freq: Once | ORAL | Status: DC

## 2022-11-13 MED ORDER — ACETAMINOPHEN 325 MG PO TABS: 650.0000 mg | ORAL_TABLET | Freq: Once | ORAL | Status: DC

## 2022-11-13 NOTE — Progress Notes (Signed)
Diagnosis: Iron Deficiency Anemia  Provider:  Chilton Greathouse MD  Procedure: IV Infusion  IV Type: Peripheral, IV Location: L Antecubital  Venofer (Iron Sucrose), Dose: 300 mg  Infusion Start Time: 0938  Infusion Stop Time: 1117  Post Infusion IV Care: Observation period completed and Peripheral IV Discontinued  Discharge: Condition: Stable, Destination: Home . AVS Declined  Performed by:  Rico Ala, LPN

## 2022-11-20 ENCOUNTER — Ambulatory Visit (INDEPENDENT_AMBULATORY_CARE_PROVIDER_SITE_OTHER): Payer: BC Managed Care – PPO

## 2022-11-20 VITALS — BP 117/75 | HR 67 | Temp 98.3°F | Resp 18 | Ht 65.0 in | Wt 186.4 lb

## 2022-11-20 DIAGNOSIS — D509 Iron deficiency anemia, unspecified: Secondary | ICD-10-CM

## 2022-11-20 MED ORDER — ACETAMINOPHEN 325 MG PO TABS
650.0000 mg | ORAL_TABLET | Freq: Once | ORAL | Status: AC
  Administered 2022-11-20: 650 mg via ORAL
  Filled 2022-11-20: qty 2

## 2022-11-20 MED ORDER — DIPHENHYDRAMINE HCL 25 MG PO CAPS
25.0000 mg | ORAL_CAPSULE | Freq: Once | ORAL | Status: AC
  Administered 2022-11-20: 25 mg via ORAL
  Filled 2022-11-20: qty 1

## 2022-11-20 MED ORDER — SODIUM CHLORIDE 0.9 % IV SOLN
300.0000 mg | Freq: Once | INTRAVENOUS | Status: AC
  Administered 2022-11-20: 300 mg via INTRAVENOUS
  Filled 2022-11-20: qty 15

## 2022-11-20 NOTE — Progress Notes (Signed)
Diagnosis: Iron Deficiency Anemia  Provider:  Chilton Greathouse MD  Procedure: IV Infusion  IV Type: Peripheral, IV Location: L Antecubital  Venofer (Iron Sucrose), Dose: 300 mg  Infusion Start Time: 1335  Infusion Stop Time: 1514  Post Infusion IV Care: Observation period completed and Peripheral IV Discontinued  Discharge: Condition: Good, Destination: Home . AVS Declined  Performed by:  Garnette Czech, RN

## 2022-12-08 NOTE — Patient Instructions (Signed)
NESIAH HATHWAY  12/08/2022     @PREFPERIOPPHARMACY @   Your procedure is scheduled on  12/14/2022.   Report to [facility] at  [phone]  A.M.   Call this number if you have problems the morning of surgery:  [phone]  If you experience any cold or flu symptoms such as cough, fever, chills, shortness of breath, etc. between now and your scheduled surgery, please notify us at the above number.   Remember:  Follow the diet and prep instructions given to you by the office.     Take these medicines the morning of surgery with A SIP OF WATER                                                  Amlodipine.     Do not wear jewelry, make-up or nail polish, including gel polish,  artificial nails, or any other type of covering on natural nails (fingers and  toes).  Do not wear lotions, powders, or perfumes, or deodorant.  Do not shave 48 hours prior to surgery.  Men may shave face and neck.  Do not bring valuables to the hospital.  [facility] is not responsible for any belongings or valuables.  Contacts, dentures or bridgework may not be worn into surgery.  Leave your suitcase in the car.  After surgery it may be brought to your room.  For patients admitted to the hospital, discharge time will be determined by your treatment team.  Patients discharged the day of surgery will not be allowed to drive home and must have someone with them for 24 hours.    Special instructions:   DO NOT smoke tobacco or vape for 24 hours before your procedure.  Please read over the following fact sheets that you were given. Anesthesia Post-op Instructions and Care and Recovery After Surgery       Flexible Sigmoidoscopy, Care After The following information offers guidance on how to care for yourself after your procedure. Your health care provider may also give you more specific instructions. If you have problems or questions, contact your health care provider. What can I expect after the  procedure? After the procedure, it is common to have: Cramping or pain in your abdomen. Bloating. A small amount of blood with your stool. This may happen if a sample of tissue was removed for testing (biopsy). Follow these instructions at home: Eating and drinking Drink enough fluid to keep your urine pale yellow. Follow instructions from your health care provider about what you may eat and drink. Go back to your normal diet as told by your health care provider. Avoid heavy or fried foods. These may be hard to digest. Activity  If you were given a sedative during the procedure, it can affect you for several hours. Do not drive or operate machinery until your health care provider says that it is safe. Rest as told by your health care provider. Do not sit for a long time without moving. Get up to take short walks every 1-2 hours. This will improve blood flow and breathing. Ask for help if you feel weak or unsteady. Return to your normal activities as told by your health care provider. Ask your health care provider what activities are safe for you. General instructions Take over-the-counter and prescription medicines only as told  by your health care provider. Try walking around when you have cramps or feel bloated. Contact a health care provider if: You have pain or cramping in your abdomen that gets worse or is not helped with medicine. You have a small amount of bleeding from your rectum for more than 24 hours after your procedure. You have nausea or vomiting. You feel weak or dizzy. You have a fever. Get help right away if: You pass large blood clots or see a large amount of blood in the toilet after having a bowel movement. You have severe pain in your abdomen. This information is not intended to replace advice given to you by your health care provider. Make sure you discuss any questions you have with your health care provider. Document Revised: 09/10/2021 Document Reviewed:  09/10/2021 Elsevier Patient Education  2024 [employer] Monitored Anesthesia Care, Care After The following information offers guidance on how to care for yourself after your procedure. Your health care provider may also give you more specific instructions. If you have problems or questions, contact your health care provider. What can I expect after the procedure? After the procedure, it is common to have: Tiredness. Little or no memory about what happened during or after the procedure. Impaired judgment when it comes to making decisions. Nausea or vomiting. Some trouble with balance. Follow these instructions at home: For the time period you were told by your health care provider:  Rest. Do not participate in activities where you could fall or become injured. Do not drive or use machinery. Do not drink alcohol. Do not take sleeping pills or medicines that cause drowsiness. Do not make important decisions or sign legal documents. Do not take care of children on your own. Medicines Take over-the-counter and prescription medicines only as told by your health care provider. If you were prescribed antibiotics, take them as told by your health care provider. Do not stop using the antibiotic even if you start to feel better. Eating and drinking Follow instructions from your health care provider about what you may eat and drink. Drink enough fluid to keep your urine pale yellow. If you vomit: Drink clear fluids slowly and in small amounts as you are able. Clear fluids include water, ice chips, low-calorie sports drinks, and fruit juice that has water added to it (diluted fruit juice). Eat light and bland foods in small amounts as you are able. These foods include bananas, applesauce, rice, lean meats, toast, and crackers. General instructions  Have a responsible adult stay with you for the time you are told. It is important to have someone help care for you until you are awake and  alert. If you have sleep apnea, surgery and some medicines can increase your risk for breathing problems. Follow instructions from your health care provider about wearing your sleep device: When you are sleeping. This includes during daytime naps. While taking prescription pain medicines, sleeping medicines, or medicines that make you drowsy. Do not use any products that contain nicotine or tobacco. These products include cigarettes, chewing tobacco, and vaping devices, such as e-cigarettes. If you need help quitting, ask your health care provider. Contact a health care provider if: You feel nauseous or vomit every time you eat or drink. You feel light-headed. You are still sleepy or having trouble with balance after 24 hours. You get a rash. You have a fever. You have redness or swelling around the IV site. Get help right away if: You have trouble breathing. You have new  confusion after you get home. These symptoms may be an emergency. Get help right away. Call 911. Do not wait to see if the symptoms will go away. Do not drive yourself to the hospital. This information is not intended to replace advice given to you by your health care provider. Make sure you discuss any questions you have with your health care provider. Document Revised: 09/01/2021 Document Reviewed: 09/01/2021 Elsevier Patient Education  2024 [employer].

## 2022-12-10 ENCOUNTER — Encounter (HOSPITAL_COMMUNITY)
Admission: RE | Admit: 2022-12-10 | Discharge: 2022-12-10 | Disposition: A | Discharge status: Home / Self Care | Payer: BC Managed Care – PPO | Source: Ambulatory Visit | Attending: Internal Medicine | Admitting: Internal Medicine

## 2022-12-10 ENCOUNTER — Encounter (HOSPITAL_COMMUNITY): Payer: Self-pay

## 2022-12-10 DIAGNOSIS — D509 Iron deficiency anemia, unspecified: Secondary | ICD-10-CM

## 2022-12-10 DIAGNOSIS — Z01818 Encounter for other preprocedural examination: Secondary | ICD-10-CM

## 2022-12-10 DIAGNOSIS — I10 Essential (primary) hypertension: Secondary | ICD-10-CM

## 2022-12-11 ENCOUNTER — Encounter: Payer: Self-pay | Admitting: Interventional Radiology

## 2022-12-11 ENCOUNTER — Other Ambulatory Visit: Payer: Self-pay | Admitting: Obstetrics and Gynecology

## 2022-12-11 DIAGNOSIS — D219 Benign neoplasm of connective and other soft tissue, unspecified: Secondary | ICD-10-CM

## 2022-12-11 DIAGNOSIS — D259 Leiomyoma of uterus, unspecified: Secondary | ICD-10-CM

## 2022-12-11 DIAGNOSIS — N921 Excessive and frequent menstruation with irregular cycle: Secondary | ICD-10-CM

## 2022-12-11 DIAGNOSIS — D500 Iron deficiency anemia secondary to blood loss (chronic): Secondary | ICD-10-CM

## 2022-12-14 ENCOUNTER — Encounter (HOSPITAL_COMMUNITY): Admission: RE | Payer: Self-pay | Source: Home / Self Care

## 2022-12-14 ENCOUNTER — Ambulatory Visit (HOSPITAL_COMMUNITY): Admission: RE | Admit: 2022-12-14 | Payer: BC Managed Care – PPO | Source: Home / Self Care

## 2022-12-14 SURGERY — SIGMOIDOSCOPY, FLEXIBLE
Anesthesia: Monitor Anesthesia Care

## 2022-12-28 ENCOUNTER — Other Ambulatory Visit: Payer: Self-pay | Admitting: Medical Oncology

## 2022-12-28 DIAGNOSIS — D509 Iron deficiency anemia, unspecified: Secondary | ICD-10-CM

## 2022-12-29 ENCOUNTER — Inpatient Hospital Stay: Payer: BC Managed Care – PPO | Attending: Physician Assistant

## 2022-12-29 ENCOUNTER — Inpatient Hospital Stay: Payer: BC Managed Care – PPO | Admitting: Internal Medicine

## 2022-12-29 VITALS — BP 150/95 | HR 76 | Temp 98.3°F | Resp 18 | Ht 65.0 in | Wt 189.0 lb

## 2022-12-29 DIAGNOSIS — D500 Iron deficiency anemia secondary to blood loss (chronic): Secondary | ICD-10-CM | POA: Insufficient documentation

## 2022-12-29 DIAGNOSIS — D509 Iron deficiency anemia, unspecified: Secondary | ICD-10-CM

## 2022-12-29 DIAGNOSIS — N920 Excessive and frequent menstruation with regular cycle: Secondary | ICD-10-CM | POA: Insufficient documentation

## 2022-12-29 DIAGNOSIS — E538 Deficiency of other specified B group vitamins: Secondary | ICD-10-CM | POA: Diagnosis not present

## 2022-12-29 DIAGNOSIS — D6859 Other primary thrombophilia: Secondary | ICD-10-CM | POA: Insufficient documentation

## 2022-12-29 DIAGNOSIS — M7989 Other specified soft tissue disorders: Secondary | ICD-10-CM | POA: Diagnosis not present

## 2022-12-29 LAB — CBC WITH DIFFERENTIAL (CANCER CENTER ONLY)
Abs Immature Granulocytes: 0.04 10*3/uL (ref 0.00–0.07)
Basophils Absolute: 0.1 10*3/uL (ref 0.0–0.1)
Basophils Relative: 1 %
Eosinophils Absolute: 0.3 10*3/uL (ref 0.0–0.5)
Eosinophils Relative: 5 %
HCT: 40.3 % (ref 36.0–46.0)
Hemoglobin: 13 g/dL (ref 12.0–15.0)
Immature Granulocytes: 1 %
Lymphocytes Relative: 34 %
Lymphs Abs: 1.9 10*3/uL (ref 0.7–4.0)
MCH: 25.3 pg — ABNORMAL LOW (ref 26.0–34.0)
MCHC: 32.3 g/dL (ref 30.0–36.0)
MCV: 78.4 fL — ABNORMAL LOW (ref 80.0–100.0)
Monocytes Absolute: 0.5 10*3/uL (ref 0.1–1.0)
Monocytes Relative: 8 %
Neutro Abs: 2.9 10*3/uL (ref 1.7–7.7)
Neutrophils Relative %: 51 %
Platelet Count: 275 10*3/uL (ref 150–400)
RBC: 5.14 MIL/uL — ABNORMAL HIGH (ref 3.87–5.11)
RDW: 22.8 % — ABNORMAL HIGH (ref 11.5–15.5)
WBC Count: 5.6 10*3/uL (ref 4.0–10.5)
nRBC: 0 % (ref 0.0–0.2)

## 2022-12-29 LAB — CMP (CANCER CENTER ONLY)
ALT: 27 U/L (ref 0–44)
AST: 17 U/L (ref 15–41)
Albumin: 4.2 g/dL (ref 3.5–5.0)
Alkaline Phosphatase: 49 U/L (ref 38–126)
Anion gap: 6 (ref 5–15)
BUN: 11 mg/dL (ref 6–20)
CO2: 25 mmol/L (ref 22–32)
Calcium: 9.6 mg/dL (ref 8.9–10.3)
Chloride: 106 mmol/L (ref 98–111)
Creatinine: 0.78 mg/dL (ref 0.44–1.00)
GFR, Estimated: >60 mL/min (ref >60)
Glucose, Bld: 95 mg/dL (ref 70–99)
Potassium: 3.9 mmol/L (ref 3.5–5.1)
Sodium: 137 mmol/L (ref 135–145)
Total Bilirubin: 0.5 mg/dL (ref 0.3–1.2)
Total Protein: 7.5 g/dL (ref 6.5–8.1)

## 2022-12-29 LAB — IRON AND IRON BINDING CAPACITY (CC-WL,HP ONLY)
Iron: 67 ug/dL (ref 28–170)
Saturation Ratios: 18 % (ref 10.4–31.8)
TIBC: 381 ug/dL (ref 250–450)
UIBC: 314 ug/dL (ref 148–442)

## 2022-12-29 LAB — FOLATE: Folate: 11.7 ng/mL (ref >5.9)

## 2022-12-29 LAB — FERRITIN: Ferritin: 56 ng/mL (ref 11–307)

## 2022-12-29 LAB — VITAMIN B12: Vitamin B-12: 373 pg/mL (ref 180–914)

## 2022-12-29 NOTE — Progress Notes (Signed)
[facility] [phone]   Fax:([phone]) [phone]  OFFICE PROGRESS NOTE  Gerald Leitz, MD [address] [place] [address]  DIAGNOSIS:  1) history of iron deficiency anemia secondary to menorrhagia and vitamin B12 deficiency. 2) nonspecific acquired protein S deficiency  PRIOR THERAPY:  1) Iron infusion with Venofer 300 mg IV weekly for 3 weeks. 2) Monthly vitamin B12 injection 1000 mcg intramuscular x4.  CURRENT THERAPY: Ferrous sulfate 325 mg p.o. daily in addition to vitamin B12 oral supplement daily.  INTERVAL HISTORY: Margaret Owen 47 y.o. female returns to the clinic today for follow-up visit.  The patient is feeling fine today with no concerning complaints.  She has only mild swelling of the lower extremities.  She is currently on Depo-Provera and her menorrhagia is a little bit better.  She denied having any significant fatigue or weakness.  She denied having any nausea, vomiting, diarrhea or constipation.  She has no headache or visual changes.  She has no fever or chills.  She continues to tolerate her treatment with over-the-counter ferrous sulfate and vitamin B12 supplement fairly well.  She is here today for evaluation and repeat blood work.  MEDICAL HISTORY: Past Medical History:  Diagnosis Date   Bell palsy    when she was a teenager   Breakthrough bleeding associated with intrauterine device (IUD) 03/22/2017   Hypertension    IUD contraception 10/05/2016   Malpositioned IUD 04/14/2017   REmoved 04/14/17    MIGRAINES, HX OF 06/11/2008   Qualifier: Diagnosis of  By: Jen Mow MD, Christine     Pedal edema 10/05/2016   TIA (transient ischemic attack)     ALLERGIES:  has No Known Allergies.  MEDICATIONS:  Current Outpatient Medications  Medication Sig Dispense Refill   acetaminophen (TYLENOL) 500 MG tablet Take 500 mg by mouth every 6 (six) hours as needed for moderate pain or mild pain.     amLODipine (NORVASC) 10 MG  tablet TAKE 1 TABLET(10 MG) BY MOUTH DAILY 90 tablet 1   aspirin EC 81 MG EC tablet Take 1 tablet (81 mg total) by mouth daily. Swallow whole. 30 tablet 1   Cyanocobalamin (VITAMIN B-12 PO) Take 1 tablet by mouth daily.     DEPO-PROVERA 150 MG/ML injection      docusate sodium (STOOL SOFTENER) 100 MG capsule Take 200 mg by mouth in the morning.     ferrous sulfate 325 (65 FE) MG tablet Take 325 mg by mouth every other day.     Naphazoline-Pheniramine (VISINE-A OP) Place 1 drop into both eyes daily as needed (dry eyes/redness).     potassium chloride (KLOR-CON) 10 MEQ tablet Take 1 tablet (10 mEq total) by mouth daily. 30 tablet 0   rosuvastatin (CRESTOR) 20 MG tablet TAKE 1 TABLET(20 MG) BY MOUTH DAILY 90 tablet 3   VITAMIN D PO Take 1 tablet by mouth daily.     No current facility-administered medications for this visit.    SURGICAL HISTORY:  Past Surgical History:  Procedure Laterality Date   CERVICAL CERCLAGE     CESAREAN SECTION     [phone]   CESAREAN SECTION  08/17/1998   COLONOSCOPY WITH PROPOFOL N/A 06/08/2022   Procedure: COLONOSCOPY WITH PROPOFOL;  Surgeon: Lanelle Bal, DO;  [facility];  Service: Endoscopy;  Laterality: N/A;  9:30AM, ASA 3   POLYPECTOMY  06/08/2022   Procedure: POLYPECTOMY;  Surgeon: Lanelle Bal, DO;  Location: [facility];  Service: Endoscopy;;   TEE WITHOUT CARDIOVERSION N/A 07/08/2021   Procedure: TRANSESOPHAGEAL ECHOCARDIOGRAM (TEE);  Surgeon: Christell Constant, MD;  Location: [facility] ENDOSCOPY;  Service: Cardiovascular;  Laterality: N/A;  being done in cath lab holding    REVIEW OF SYSTEMS:  A comprehensive review of systems was negative.   PHYSICAL EXAMINATION: General appearance: alert, cooperative, and no distress Head: Normocephalic, without obvious abnormality, atraumatic Neck: no adenopathy, no JVD, supple, symmetrical, trachea midline, and thyroid not enlarged, symmetric, no tenderness/mass/nodules Lymph nodes:  Cervical, supraclavicular, and axillary nodes normal. Resp: clear to auscultation bilaterally Back: symmetric, no curvature. ROM normal. No CVA tenderness. Cardio: regular rate and rhythm, S1, S2 normal, no murmur, click, rub or gallop GI: soft, non-tender; bowel sounds normal; no masses,  no organomegaly Extremities: extremities normal, atraumatic, no cyanosis or edema  ECOG PERFORMANCE STATUS: 0 - Asymptomatic  Blood pressure (!) 150/95, pulse 76, temperature 98.3 F (36.8 C), temperature source Oral, resp. rate 18, height 5\' 5"  (1.651 m), weight 189 lb (85.7 kg), SpO2 100%.  LABORATORY DATA: Lab Results  Component Value Date   WBC 5.6 12/29/2022   HGB 13.0 12/29/2022   HCT 40.3 12/29/2022   MCV 78.4 (L) 12/29/2022   PLT 275 12/29/2022      Chemistry      Component Value Date/Time   NA 135 04/30/2022 1642   K 4.0 04/30/2022 1642   CL 101 04/30/2022 1642   CO2 22 04/30/2022 1642   BUN 9 04/30/2022 1642   CREATININE 0.70 04/30/2022 1642   CREATININE 0.73 09/04/2021 0901   CREATININE 0.61 11/14/2019 0733      Component Value Date/Time   CALCIUM 9.3 04/30/2022 1642   ALKPHOS 48 10/31/2021 0809   AST 9 10/31/2021 0809   AST 11 (L) 09/04/2021 0901   ALT 10 10/31/2021 0809   ALT 10 09/04/2021 0901   BILITOT 0.2 10/31/2021 0809   BILITOT 0.3 09/04/2021 0901       RADIOGRAPHIC STUDIES: No results found.  ASSESSMENT AND PLAN: This is a very pleasant 47 years old African-American female with history of iron deficiency anemia secondary to menorrhagia in addition to vitamin B12 deficiency and suspicious protein S deficiency that significantly improved on repeat protein S panel. The patient was treated with Venofer 300 mg IV weekly for 3 weeks in addition to vitamin B12 injection 1000 mcg intramuscular for 4 doses. The patient is currently on over-the-counter ferrous sulfate and vitamin B12 supplement and tolerating it fairly well. Repeat CBC showed normal hemoglobin of 13.0  and hematocrit 40.3%. Iron study, ferritin, vitamin B12 and serum folate are still pending. I recommended for the patient to continue her current treatment with the iron and vitamin B12 supplement for now. I will see her back for follow-up visit in 6 months for evaluation and repeat blood work. She was advised to call immediately if she has any other concerning symptoms in the interval. The patient voices understanding of current disease status and treatment options and is in agreement with the current care plan.  All questions were answered. The patient knows to call the clinic with any problems, questions or concerns. We can certainly see the patient much sooner if necessary.  The total time spent in the appointment was 25 minutes.  Disclaimer: This note was dictated with voice recognition software. Similar sounding words can inadvertently be transcribed and may not be corrected upon review.

## 2022-12-31 ENCOUNTER — Other Ambulatory Visit: Payer: Self-pay | Admitting: Interventional Radiology

## 2022-12-31 DIAGNOSIS — D259 Leiomyoma of uterus, unspecified: Secondary | ICD-10-CM

## 2022-12-31 LAB — PROTEIN ELECTROPHORESIS, SERUM, WITH REFLEX
A/G Ratio: 1.2 (ref 0.7–1.7)
Albumin ELP: 3.8 g/dL (ref 2.9–4.4)
Alpha-1-Globulin: 0.2 g/dL (ref 0.0–0.4)
Alpha-2-Globulin: 0.7 g/dL (ref 0.4–1.0)
Beta Globulin: 1 g/dL (ref 0.7–1.3)
Gamma Globulin: 1.2 g/dL (ref 0.4–1.8)
Globulin, Total: 3.1 g/dL (ref 2.2–3.9)
Total Protein ELP: 6.9 g/dL (ref 6.0–8.5)

## 2023-02-17 ENCOUNTER — Other Ambulatory Visit: Payer: Self-pay | Admitting: Internal Medicine

## 2023-02-17 DIAGNOSIS — I10 Essential (primary) hypertension: Secondary | ICD-10-CM

## 2023-06-29 ENCOUNTER — Inpatient Hospital Stay (HOSPITAL_BASED_OUTPATIENT_CLINIC_OR_DEPARTMENT_OTHER): Payer: BC Managed Care – PPO | Admitting: Internal Medicine

## 2023-06-29 ENCOUNTER — Inpatient Hospital Stay: Payer: BC Managed Care – PPO | Attending: Internal Medicine

## 2023-06-29 VITALS — BP 130/88 | HR 86 | Temp 98.0°F | Resp 16 | Wt 192.0 lb

## 2023-06-29 DIAGNOSIS — E538 Deficiency of other specified B group vitamins: Secondary | ICD-10-CM | POA: Insufficient documentation

## 2023-06-29 DIAGNOSIS — R600 Localized edema: Secondary | ICD-10-CM | POA: Diagnosis not present

## 2023-06-29 DIAGNOSIS — N920 Excessive and frequent menstruation with regular cycle: Secondary | ICD-10-CM | POA: Insufficient documentation

## 2023-06-29 DIAGNOSIS — D6859 Other primary thrombophilia: Secondary | ICD-10-CM | POA: Insufficient documentation

## 2023-06-29 DIAGNOSIS — D500 Iron deficiency anemia secondary to blood loss (chronic): Secondary | ICD-10-CM | POA: Diagnosis present

## 2023-06-29 LAB — CBC WITH DIFFERENTIAL (CANCER CENTER ONLY)
Abs Immature Granulocytes: 0.05 10*3/uL (ref 0.00–0.07)
Basophils Absolute: 0.1 10*3/uL (ref 0.0–0.1)
Basophils Relative: 1 %
Eosinophils Absolute: 0.2 10*3/uL (ref 0.0–0.5)
Eosinophils Relative: 3 %
HCT: 34.1 % — ABNORMAL LOW (ref 36.0–46.0)
Hemoglobin: 11.4 g/dL — ABNORMAL LOW (ref 12.0–15.0)
Immature Granulocytes: 1 %
Lymphocytes Relative: 28 %
Lymphs Abs: 2 10*3/uL (ref 0.7–4.0)
MCH: 27.8 pg (ref 26.0–34.0)
MCHC: 33.4 g/dL (ref 30.0–36.0)
MCV: 83.2 fL (ref 80.0–100.0)
Monocytes Absolute: 0.5 10*3/uL (ref 0.1–1.0)
Monocytes Relative: 7 %
Neutro Abs: 4.4 10*3/uL (ref 1.7–7.7)
Neutrophils Relative %: 60 %
Platelet Count: 252 10*3/uL (ref 150–400)
RBC: 4.1 MIL/uL (ref 3.87–5.11)
RDW: 13.8 % (ref 11.5–15.5)
WBC Count: 7.1 10*3/uL (ref 4.0–10.5)
nRBC: 0 % (ref 0.0–0.2)

## 2023-06-29 LAB — IRON AND IRON BINDING CAPACITY (CC-WL,HP ONLY)
Iron: 116 ug/dL (ref 28–170)
Saturation Ratios: 33 % — ABNORMAL HIGH (ref 10.4–31.8)
TIBC: 356 ug/dL (ref 250–450)
UIBC: 240 ug/dL (ref 148–442)

## 2023-06-29 LAB — FERRITIN: Ferritin: 28 ng/mL (ref 11–307)

## 2023-06-29 LAB — FOLATE: Folate: 13.1 ng/mL (ref >5.9)

## 2023-06-29 LAB — VITAMIN B12: Vitamin B-12: 493 pg/mL (ref 180–914)

## 2023-06-29 NOTE — Progress Notes (Signed)
 [facility] [phone]) [phone]   Fax:(336) [phone]  OFFICE PROGRESS NOTE  Gerald Leitz, MD [address] [place] [address]  DIAGNOSIS:  1) history of iron deficiency anemia secondary to menorrhagia and vitamin B12 deficiency. 2) nonspecific acquired protein S deficiency  PRIOR THERAPY:  1) Iron infusion with Venofer 300 mg IV weekly for 3 weeks. 2) Monthly vitamin B12 injection 1000 mcg intramuscular x4.  CURRENT THERAPY: Ferrous sulfate 325 mg p.o. daily in addition to vitamin B12 oral supplement daily.  INTERVAL HISTORY: Margaret Owen Desire 48 y.o. female returns to the clinic today for follow-up visit.Discussed the use of AI scribe software for clinical note transcription with the patient, who gave verbal consent to proceed.  History of Present Illness   The patient is a 48 year old with iron deficiency anemia and menorrhagia who presents for follow-up.  She has a history of iron deficiency anemia, likely secondary to menorrhagia, and has been treated with iron infusions (Venofer) and oral ferrous sulfate 325 mg daily. She also takes vitamin B12 supplements. Her hemoglobin level today is 11.4, which is a decrease from 13 in September, and she attributes this drop to her ongoing heavy menstrual bleeding. No dizziness, shortness of breath, or fatigue despite her hemoglobin level.  She experiences menorrhagia with her current menstrual period lasting almost two weeks and being heavy. The flow is unpredictable, with episodes of heavy bleeding followed by lighter days, only for the heavy flow to return. She previously received two injections of Depo-Provera since July but discontinued it in December due to significant weight gain.  She mentions experiencing swelling in her feet at times, which she suspects might be related to her amlodipine use. The swelling is not present currently but occurs intermittently, causing a tight sensation in her feet.         MEDICAL HISTORY: Past Medical History:  Diagnosis Date   Bell palsy    when she was a teenager   Breakthrough bleeding associated with intrauterine device (IUD) 03/22/2017   Hypertension    IUD contraception 10/05/2016   Malpositioned IUD 04/14/2017   REmoved 04/14/17    MIGRAINES, HX OF 06/11/2008   Qualifier: Diagnosis of  By: Jen Mow MD, Christine     Pedal edema 10/05/2016   TIA (transient ischemic attack)     ALLERGIES:  has no known allergies.  MEDICATIONS:  Current Outpatient Medications  Medication Sig Dispense Refill   acetaminophen (TYLENOL) 500 MG tablet Take 500 mg by mouth every 6 (six) hours as needed for moderate pain or mild pain.     amLODipine (NORVASC) 10 MG tablet TAKE 1 TABLET(10 MG) BY MOUTH DAILY 90 tablet 1   aspirin EC 81 MG EC tablet Take 1 tablet (81 mg total) by mouth daily. Swallow whole. 30 tablet 1   Cyanocobalamin (VITAMIN B-12 PO) Take 1 tablet by mouth daily.     DEPO-PROVERA 150 MG/ML injection      docusate sodium (STOOL SOFTENER) 100 MG capsule Take 200 mg by mouth in the morning.     ferrous sulfate 325 (65 FE) MG tablet Take 325 mg by mouth every other day.     Naphazoline-Pheniramine (VISINE-A OP) Place 1 drop into both eyes daily as needed (dry eyes/redness).     potassium chloride (KLOR-CON) 10 MEQ tablet Take 1 tablet (10 mEq total) by mouth daily. 30 tablet 0   rosuvastatin (CRESTOR) 20 MG tablet TAKE 1 TABLET(20 MG) BY MOUTH  DAILY 90 tablet 3   VITAMIN D PO Take 1 tablet by mouth daily.     No current facility-administered medications for this visit.    SURGICAL HISTORY:  Past Surgical History:  Procedure Laterality Date   CERVICAL CERCLAGE     CESAREAN SECTION     [phone]   CESAREAN SECTION  08/17/1998   COLONOSCOPY WITH PROPOFOL N/A 06/08/2022   Procedure: COLONOSCOPY WITH PROPOFOL;  Surgeon: Lanelle Bal, DO;  [facility];  Service: Endoscopy;  Laterality: N/A;  9:30AM, ASA 3   POLYPECTOMY   06/08/2022   Procedure: POLYPECTOMY;  Surgeon: Lanelle Bal, DO;  [facility]: [facility];  Service: Endoscopy;;   TEE WITHOUT CARDIOVERSION N/A 07/08/2021   Procedure: TRANSESOPHAGEAL ECHOCARDIOGRAM (TEE);  Surgeon: Christell Constant, MD;  Location: [facility] ENDOSCOPY;  Service: Cardiovascular;  Laterality: N/A;  being done in cath lab holding    REVIEW OF SYSTEMS:  A comprehensive review of systems was negative except for: Constitutional: positive for fatigue   PHYSICAL EXAMINATION: General appearance: alert, cooperative, fatigued, and no distress Head: Normocephalic, without obvious abnormality, atraumatic Neck: no adenopathy, no JVD, supple, symmetrical, trachea midline, and thyroid not enlarged, symmetric, no tenderness/mass/nodules Lymph nodes: Cervical, supraclavicular, and axillary nodes normal. Resp: clear to auscultation bilaterally Back: symmetric, no curvature. ROM normal. No CVA tenderness. Cardio: regular rate and rhythm, S1, S2 normal, no murmur, click, rub or gallop GI: soft, non-tender; bowel sounds normal; no masses,  no organomegaly Extremities: extremities normal, atraumatic, no cyanosis or edema  ECOG PERFORMANCE STATUS: 0 - Asymptomatic  Blood pressure 130/88, pulse 86, temperature 98 F (36.7 C), temperature source Temporal, resp. rate 16, weight 192 lb 0.6 oz (87.1 kg), SpO2 99%.  LABORATORY DATA: Lab Results  Component Value Date   WBC 7.1 06/29/2023   HGB 11.4 (L) 06/29/2023   HCT 34.1 (L) 06/29/2023   MCV 83.2 06/29/2023   PLT 252 06/29/2023      Chemistry      Component Value Date/Time   NA 137 12/29/2022 0902   NA 135 04/30/2022 1642   K 3.9 12/29/2022 0902   CL 106 12/29/2022 0902   CO2 25 12/29/2022 0902   BUN 11 12/29/2022 0902   BUN 9 04/30/2022 1642   CREATININE 0.78 12/29/2022 0902   CREATININE 0.61 11/14/2019 0733      Component Value Date/Time   CALCIUM 9.6 12/29/2022 0902   ALKPHOS 49 12/29/2022 0902   AST 17 12/29/2022 0902    ALT 27 12/29/2022 0902   BILITOT 0.5 12/29/2022 0902       RADIOGRAPHIC STUDIES: No results found.  ASSESSMENT AND PLAN: This is a very pleasant 48 years old African-American female with history of iron deficiency anemia secondary to menorrhagia in addition to vitamin B12 deficiency and suspicious protein S deficiency that significantly improved on repeat protein S panel. The patient was treated with Venofer 300 mg IV weekly for 3 weeks in addition to vitamin B12 injection 1000 mcg intramuscular for 4 doses. The patient is currently on over-the-counter ferrous sulfate and vitamin B12 supplement and tolerating it fairly well.    Iron deficiency anemia secondary to menorrhagia Iron deficiency anemia likely due to menorrhagia. Current hemoglobin is 11.4, decreased from 13 in September, but improved from a previous level of 9. The decrease is attributed to ongoing heavy menstrual bleeding lasting nearly two weeks. She is on ferrous sulfate 325 mg daily and vitamin B12 supplementation. She reports no significant anemia symptoms such as  fatigue, dizziness, or dyspnea. Discontinued Depo-Provera due to weight gain and is exploring alternative hormonal regulation with her gynecologist to manage menorrhagia. Iron infusion will be considered if iron and ferritin levels are severely low. - Continue ferrous sulfate 325 mg daily. - Continue vitamin B12 supplementation. - Monitor hemoglobin, iron, and ferritin levels. - Arrange iron infusion if iron and ferritin levels are severely low. - Discuss hormonal regulation options with gynecologist to manage menorrhagia.  Vitamin B12 deficiency Vitamin B12 deficiency managed with supplementation. No specific issues related to vitamin B12 deficiency were addressed in this visit. - Continue vitamin B12 supplementation.  Peripheral edema Intermittent pedal edema, possibly related to amlodipine. She reports occasional swelling with a tight sensation. Plans to  discuss with her primary care provider. Leg elevation at the end of the day may alleviate symptoms. - Advise elevating legs at the end of the day. - Discuss with primary care provider regarding possible amlodipine-related edema.   The patient was advised to call immediately if she has any concerning symptoms in the interval. The patient voices understanding of current disease status and treatment options and is in agreement with the current care plan.  All questions were answered. The patient knows to call the clinic with any problems, questions or concerns. We can certainly see the patient much sooner if necessary.  The total time spent in the appointment was 25 minutes.  Disclaimer: This note was dictated with voice recognition software. Similar sounding words can inadvertently be transcribed and may not be corrected upon review.

## 2023-09-17 ENCOUNTER — Ambulatory Visit
Admission: EM | Admit: 2023-09-17 | Discharge: 2023-09-17 | Disposition: A | Discharge status: Home / Self Care | Attending: Family Medicine | Admitting: Family Medicine

## 2023-09-17 ENCOUNTER — Telehealth: Payer: Self-pay | Admitting: Emergency Medicine

## 2023-09-17 DIAGNOSIS — S66911A Strain of unspecified muscle, fascia and tendon at wrist and hand level, right hand, initial encounter: Secondary | ICD-10-CM | POA: Diagnosis not present

## 2023-09-17 MED ORDER — TIZANIDINE HCL 4 MG PO CAPS: 4.0000 mg | ORAL_CAPSULE | Freq: Three times a day (TID) | ORAL | 0 refills | Status: DC | PRN

## 2023-09-17 NOTE — Telephone Encounter (Signed)
 Patient requested that tizanidine  be sent to [facility] on [address]

## 2023-09-17 NOTE — ED Triage Notes (Signed)
 Pt reports right wrist pain, after helping pick her bedridden brother in law up, states there has been swelling clicking and popping since Sunday.

## 2023-09-17 NOTE — Discharge Instructions (Signed)
 Rest, ice, compression, elevation, over-the-counter pain relievers as needed.  I have also prescribed a muscle relaxer to help additionally.

## 2023-09-17 NOTE — ED Provider Notes (Signed)
 [facility]    CSN: [id number] Arrival date & time: 09/17/23  1437      History   Chief Complaint No chief complaint on file.   HPI Margaret Owen is a 48 y.o. female.   Patient presenting today with 4 to 5-day history of right wrist pain, swelling after helping to pick up a family member who is bedridden almost a week ago.  States there is no clicking sound when she rotates her wrist.  Denies loss of range of motion, numbness, tingling, weakness, discoloration.  So far not trying anything over-the-counter for symptoms.    Past Medical History:  Diagnosis Date   Bell palsy    when she was a teenager   Breakthrough bleeding associated with intrauterine device (IUD) 03/22/2017   Hypertension    IUD contraception 10/05/2016   Malpositioned IUD 04/14/2017   REmoved 04/14/17    MIGRAINES, HX OF 06/11/2008   Qualifier: Diagnosis of  By: Seabron Cypress MD, Christine     Pedal edema 10/05/2016   TIA (transient ischemic attack)     Patient Active Problem List   Diagnosis Date Noted   Hypokalemia 04/30/2022   Encounter for examination following treatment at hospital 02/20/2022   H/O: CVA (cerebrovascular accident) 10/23/2021   Encounter for general adult medical examination with abnormal findings 10/23/2021   Iron  deficiency anemia 07/24/2021   Protein S deficiency (HCC) 07/24/2021   B12 deficiency 07/17/2021   Hospital discharge follow-up 07/17/2021   TIA (transient ischemic attack) 07/06/2021   Hx of migraines 07/06/2021   Allergic rhinitis 05/22/2020   Overweight with body mass index (BMI) of 29 to 29.9 in adult 10/26/2019   Essential hypertension 07/27/2019   Anemia 07/27/2019   Intramural and submucous leiomyoma of uterus 07/14/2017   Fibroids, submucosal 04/14/2017    Past Surgical History:  Procedure Laterality Date   CERVICAL CERCLAGE     CESAREAN SECTION     2000,2003,2005   CESAREAN SECTION  08/17/1998   COLONOSCOPY WITH PROPOFOL  N/A 06/08/2022    Procedure: COLONOSCOPY WITH PROPOFOL ;  Surgeon: Vinetta Greening, DO;  Location: [facility];  Service: Endoscopy;  Laterality: N/A;  9:30AM, ASA 3   POLYPECTOMY  06/08/2022   Procedure: POLYPECTOMY;  Surgeon: Vinetta Greening, DO;  Location: [facility];  Service: Endoscopy;;   TEE WITHOUT CARDIOVERSION N/A 07/08/2021   Procedure: TRANSESOPHAGEAL ECHOCARDIOGRAM (TEE);  Surgeon: Jann Melody, MD;  Location[facility];  Service: Cardiovascular;  Laterality: N/A;  being done in cath lab holding    OB History     Gravida  5   Para  3   Term      Preterm  3   AB  2   Living  3      SAB  1   IAB  1   Ectopic      Multiple      Live Births  3            Home Medications    Prior to Admission medications   Medication Sig Start Date End Date Taking? Authorizing Provider  acetaminophen  (TYLENOL ) 500 MG tablet Take 500 mg by mouth every 6 (six) hours as needed for moderate pain or mild pain.    [provider]  amLODipine  (NORVASC ) 10 MG tablet TAKE 1 TABLET(10 MG) BY MOUTH DAILY 02/17/23   Meldon Sport, MD  aspirin  EC 81 MG EC tablet Take 1 tablet (81 mg total) by mouth daily. Swallow whole.  07/09/21   Modena Andes, MD  Cyanocobalamin  (VITAMIN B-12 PO) Take 1 tablet by mouth daily.    [provider]  DEPO-PROVERA 150 MG/ML injection  10/12/22   [provider]  docusate sodium (STOOL SOFTENER) 100 MG capsule Take 200 mg by mouth in the morning.    [provider]  ferrous sulfate 325 (65 FE) MG tablet Take 325 mg by mouth every other day.    [provider]  Naphazoline-Pheniramine (VISINE-A OP) Place 1 drop into both eyes daily as needed (dry eyes/redness).    [provider]  potassium chloride  (KLOR-CON ) 10 MEQ tablet Take 1 tablet (10 mEq total) by mouth daily. 03/17/22 06/02/22  Arvilla Birmingham, MD  rosuvastatin  (CRESTOR ) 20 MG tablet TAKE 1 TABLET(20 MG) BY MOUTH DAILY 08/14/22   Meldon Sport, MD  tiZANidine  (ZANAFLEX ) 4 MG capsule Take 1 capsule (4 mg total) by mouth 3 (three) times daily as needed for muscle spasms. Do not drink alcohol or drive while taking this medication.  May cause drowsiness. 09/17/23   Corbin Dess, PA-C  VITAMIN D  PO Take 1 tablet by mouth daily.    [provider]    Family History Family History  Problem Relation Age of Onset   Heart failure Father    Alcohol abuse Father    Heart disease Father 62       died at 64   Hypertension Father    Stroke Mother    Hypertension Mother    Arthritis Mother    Asthma Mother    Diabetes Mother    Hyperlipidemia Mother    Glaucoma Mother    Breast cancer Maternal Aunt    Colon cancer Maternal Aunt    Stroke Maternal Aunt    Glaucoma Sister    Asthma Brother     Social History Social History   Tobacco Use   Smoking status: Never   Smokeless tobacco: Never  Vaping Use   Vaping status: Never Used  Substance Use Topics   Alcohol use: Yes    Comment: occasional   Drug use: No     Allergies   Patient has no known allergies.   Review of Systems Review of Systems Per HPI  Physical Exam Triage Vital Signs ED Triage Vitals  Encounter Vitals Group     BP 09/17/23 1438 (!) 149/83     Systolic BP Percentile --      Diastolic BP Percentile --      Pulse --      Resp 09/17/23 1438 20     Temp 09/17/23 1438 98.4 F (36.9 C)     Temp Source 09/17/23 1438 Other     SpO2 09/17/23 1438 97 %     Weight --      Height --      Head Circumference --      Peak Flow --      Pain Score 09/17/23 1441 7     Pain Loc --      Pain Education --      Exclude from Growth Chart --    No data found.  Updated Vital Signs BP (!) 149/83 (BP Location: Right Arm)   Temp 98.4 F (36.9 C) (Other (Comment))   Resp 20   SpO2 97%   Visual Acuity Right Eye Distance:   Left Eye Distance:   Bilateral Distance:    Right Eye Near:   Left Eye Near:    Bilateral Near:  Physical  Exam Vitals and nursing note reviewed.  Constitutional:      Appearance: Normal appearance. She is not ill-appearing.  HENT:     Head: Atraumatic.  Eyes:     Extraocular Movements: Extraocular movements intact.     Conjunctiva/sclera: Conjunctivae normal.  Cardiovascular:     Rate and Rhythm: Normal rate.  Pulmonary:     Effort: Pulmonary effort is normal.  Musculoskeletal:        General: Swelling, tenderness and signs of injury present. No deformity. Normal range of motion.     Cervical back: Normal range of motion and neck supple.     Comments: Trace edema to the right wrist particularly radially, tender to palpation in this area.  Tendon click appreciable on rotation  Skin:    General: Skin is warm and dry.     Findings: No bruising or erythema.  Neurological:     Mental Status: She is alert and oriented to person, place, and time.     Comments: Right upper extremity neurovascularly intact  Psychiatric:        Mood and Affect: Mood normal.        Thought Content: Thought content normal.        Judgment: Judgment normal.      UC Treatments / Results  Labs (all labs ordered are listed, but only abnormal results are displayed) Labs Reviewed - No data to display  EKG   Radiology No results found.  Procedures Procedures (including critical care time)  Medications Ordered in UC Medications - No data to display  Initial Impression / Assessment and Plan / UC Course  I have reviewed the triage vital signs and the nursing notes.  Pertinent labs & imaging results that were available during my care of the patient were reviewed by me and considered in my medical decision making (see chart for details).     Suspect tendon strain, treat with RICE protocol, muscle relaxer, supportive home care and over-the-counter pain relievers.  Return for worsening symptoms.  Final Clinical Impressions(s) / UC Diagnoses   Final diagnoses:  Strain of right wrist, initial encounter      Discharge Instructions      Rest, ice, compression, elevation, over-the-counter pain relievers as needed.  I have also prescribed a muscle relaxer to help additionally.  ED Prescriptions     Medication Sig Dispense Auth. Provider   tiZANidine (ZANAFLEX) 4 MG capsule Take 1 capsule (4 mg total) by mouth 3 (three) times daily as needed for muscle spasms. Do not drink alcohol or drive while taking this medication.  May cause drowsiness. 15 capsule Corbin Dess, [place]      PDMP not reviewed this encounter.   Corbin Dess, [place] 09/17/23 1544

## 2023-10-08 ENCOUNTER — Other Ambulatory Visit: Payer: Self-pay | Admitting: Internal Medicine

## 2023-10-08 DIAGNOSIS — I10 Essential (primary) hypertension: Secondary | ICD-10-CM

## 2023-10-15 ENCOUNTER — Other Ambulatory Visit: Payer: Self-pay | Admitting: Obstetrics and Gynecology

## 2023-10-15 DIAGNOSIS — Z1231 Encounter for screening mammogram for malignant neoplasm of breast: Secondary | ICD-10-CM

## 2023-10-29 ENCOUNTER — Ambulatory Visit
Admission: RE | Admit: 2023-10-29 | Discharge: 2023-10-29 | Disposition: A | Discharge status: Home / Self Care | Source: Ambulatory Visit | Attending: Obstetrics and Gynecology | Admitting: Obstetrics and Gynecology

## 2023-10-29 DIAGNOSIS — Z1231 Encounter for screening mammogram for malignant neoplasm of breast: Secondary | ICD-10-CM

## 2023-12-01 ENCOUNTER — Ambulatory Visit
Admission: EM | Admit: 2023-12-01 | Discharge: 2023-12-01 | Disposition: A | Discharge status: Home / Self Care | Attending: Family Medicine | Admitting: Family Medicine

## 2023-12-01 ENCOUNTER — Encounter: Payer: Self-pay | Admitting: Emergency Medicine

## 2023-12-01 DIAGNOSIS — I10 Essential (primary) hypertension: Secondary | ICD-10-CM

## 2023-12-01 DIAGNOSIS — R030 Elevated blood-pressure reading, without diagnosis of hypertension: Secondary | ICD-10-CM | POA: Diagnosis not present

## 2023-12-01 MED ORDER — AMLODIPINE BESYLATE 10 MG PO TABS: 10.0000 mg | ORAL_TABLET | Freq: Every day | ORAL | 0 refills | Status: DC

## 2023-12-01 NOTE — ED Triage Notes (Signed)
 Needs a refill on BP medication amlodipine  10mg .  Has been out of medication since July 19th.

## 2023-12-01 NOTE — ED Provider Notes (Signed)
 [facility]    CSN: [id number] Arrival date & time: 12/01/23  1652      History   Chief Complaint No chief complaint on file.   HPI Margaret Owen is a 48 y.o. female.   Patient presenting today requesting a refill on her amlodipine  for hypertension.  She states she has been out since July 19 but prior to that had been doing very well on the medication, tolerating well with no side effects and blood pressure is around 130s over 80s when checked.  Denies chest pain, shortness of breath, abdominal pain, dizziness, headaches.    Past Medical History:  Diagnosis Date   Bell palsy    when she was a teenager   Breakthrough bleeding associated with intrauterine device (IUD) 03/22/2017   Hypertension    IUD contraception 10/05/2016   Malpositioned IUD 04/14/2017   REmoved 04/14/17    MIGRAINES, HX OF 06/11/2008   Qualifier: Diagnosis of  By: Tamra MD, Christine     Pedal edema 10/05/2016   TIA (transient ischemic attack)     Patient Active Problem List   Diagnosis Date Noted   Hypokalemia 04/30/2022   Encounter for examination following treatment at hospital 02/20/2022   H/O: CVA (cerebrovascular accident) 10/23/2021   Encounter for general adult medical examination with abnormal findings 10/23/2021   Iron  deficiency anemia 07/24/2021   Protein S deficiency (HCC) 07/24/2021   B12 deficiency 07/17/2021   Hospital discharge follow-up 07/17/2021   TIA (transient ischemic attack) 07/06/2021   Hx of migraines 07/06/2021   Allergic rhinitis 05/22/2020   Overweight with body mass index (BMI) of 29 to 29.9 in adult 10/26/2019   Essential hypertension 07/27/2019   Anemia 07/27/2019   Intramural and submucous leiomyoma of uterus 07/14/2017   Fibroids, submucosal 04/14/2017    Past Surgical History:  Procedure Laterality Date   CERVICAL CERCLAGE     CESAREAN SECTION     2000,2003,2005   CESAREAN SECTION  08/17/1998   COLONOSCOPY WITH PROPOFOL  N/A 06/08/2022    Procedure: COLONOSCOPY WITH PROPOFOL ;  Surgeon: Cindie Carlin POUR, DO;  Location: [facility];  Service: Endoscopy;  Laterality: N/A;  9:30AM, ASA 3   POLYPECTOMY  06/08/2022   Procedure: POLYPECTOMY;  Surgeon: Cindie Carlin POUR, DO;  Location: [facility];  Service: Endoscopy;;   TEE WITHOUT CARDIOVERSION N/A 07/08/2021   Procedure: TRANSESOPHAGEAL ECHOCARDIOGRAM (TEE);  Surgeon: Santo Stanly LABOR, MD;  Location: [facility] END[facility];  Service: Cardiovascular;  Laterality: N/A;  being done in cath lab holding    OB History     Gravida  5   Para  3   Term      Preterm  3   AB  2   Living  3      SAB  1   IAB  1   Ectopic      Multiple      Live Births  3            Home Medications    Prior to Admission medications   Medication Sig Start Date End Date Taking? Authorizing Provider  acetaminophen  (TYLENOL ) 500 MG tablet Take 500 mg by mouth every 6 (six) hours as needed for moderate pain or mild pain.    [provider]  amLODipine  (NORVASC ) 10 MG tablet Take 1 tablet (10 mg total) by mouth daily. 12/01/23   Stuart Vernell Norris, PA-C  aspirin  EC 81 MG EC tablet Take 1 tablet (81 mg total) by mouth daily.  Swallow whole. 07/09/21   Pahwani, Ravi, MD  Cyanocobalamin  (VITAMIN B-12 PO) Take 1 tablet by mouth daily.    [provider]  docusate sodium (STOOL SOFTENER) 100 MG capsule Take 200 mg by mouth in the morning.    [provider]  ferrous sulfate 325 (65 FE) MG tablet Take 325 mg by mouth every other day.    [provider]  potassium chloride  (KLOR-CON ) 10 MEQ tablet Take 1 tablet (10 mEq total) by mouth daily. 03/17/22 06/02/22  Cottie Donnice PARAS, MD  VITAMIN D  PO Take 1 tablet by mouth daily.    [provider]    Family History Family History  Problem Relation Age of Onset   Heart failure Father    Alcohol abuse Father    Heart disease Father 37       died at 56   Hypertension Father    Stroke Mother     Hypertension Mother    Arthritis Mother    Asthma Mother    Diabetes Mother    Hyperlipidemia Mother    Glaucoma Mother    Breast cancer Maternal Aunt    Colon cancer Maternal Aunt    Stroke Maternal Aunt    Glaucoma Sister    Asthma Brother     Social History Social History   Tobacco Use   Smoking status: Never   Smokeless tobacco: Never  Vaping Use   Vaping status: Never Used  Substance Use Topics   Alcohol use: Yes    Comment: occasional   Drug use: No     Allergies   Patient has no known allergies.   Review of Systems Review of Systems Per HPI  Physical Exam Triage Vital Signs ED Triage Vitals  Encounter Vitals Group     BP 12/01/23 1658 (!) 164/115     Girls Systolic BP Percentile --      Girls Diastolic BP Percentile --      Boys Systolic BP Percentile --      Boys Diastolic BP Percentile --      Pulse Rate 12/01/23 1658 69     Resp 12/01/23 1658 18     Temp 12/01/23 1658 98.3 F (36.8 C)     Temp Source 12/01/23 1658 Oral     SpO2 12/01/23 1658 97 %     Weight --      Height --      Head Circumference --      Peak Flow --      Pain Score 12/01/23 1700 0     Pain Loc --      Pain Education --      Exclude from Growth Chart --    No data found.  Updated Vital Signs BP (!) 164/115 (BP Location: Right Arm)   Pulse 69   Temp 98.3 F (36.8 C) (Oral)   Resp 18   LMP 11/15/2023 (Exact Date)   SpO2 97%   Visual Acuity Right Eye Distance:   Left Eye Distance:   Bilateral Distance:    Right Eye Near:   Left Eye Near:    Bilateral Near:     Physical Exam Vitals and nursing note reviewed.  Constitutional:      Appearance: Normal appearance. She is not ill-appearing.  HENT:     Head: Atraumatic.  Eyes:     Extraocular Movements: Extraocular movements intact.     Conjunctiva/sclera: Conjunctivae normal.  Cardiovascular:     Rate and Rhythm: Normal rate and  regular rhythm.     Heart sounds: Normal heart sounds.  Pulmonary:      Effort: Pulmonary effort is normal.     Breath sounds: Normal breath sounds.  Musculoskeletal:        General: Normal range of motion.     Cervical back: Normal range of motion and neck supple.  Skin:    General: Skin is warm and dry.  Neurological:     Mental Status: She is alert and oriented to person, place, and time.  Psychiatric:        Mood and Affect: Mood normal.        Thought Content: Thought content normal.        Judgment: Judgment normal.      UC Treatments / Results  Labs (all labs ordered are listed, but only abnormal results are displayed) Labs Reviewed - No data to display  EKG   Radiology No results found.  Procedures Procedures (including critical care time)  Medications Ordered in UC Medications - No data to display  Initial Impression / Assessment and Plan / UC Course  I have reviewed the triage vital signs and the nursing notes.  Pertinent labs & imaging results that were available during my care of the patient were reviewed by me and considered in my medical decision making (see chart for details).     Elevated blood pressure reading today likely secondary to being out of her blood pressure medication.  Will refill her amlodipine , continue to log home readings and establish care as soon as possible with a new primary care provider.  Return for worsening symptoms.  Final Clinical Impressions(s) / UC Diagnoses   Final diagnoses:  Essential hypertension  Elevated blood pressure reading   Discharge Instructions   None    ED Prescriptions     Medication Sig Dispense Auth. Provider   amLODipine  (NORVASC ) 10 MG tablet Take 1 tablet (10 mg total) by mouth daily. 90 tablet Stuart Vernell Norris, [place]      PDMP not reviewed this encounter.   Stuart Vernell Norris, PA-C 12/01/23 1739

## 2023-12-13 ENCOUNTER — Encounter: Admitting: Student in an Organized Health Care Education/Training Program

## 2023-12-21 ENCOUNTER — Telehealth: Payer: Self-pay | Admitting: Internal Medicine

## 2023-12-28 ENCOUNTER — Inpatient Hospital Stay: Admitting: Internal Medicine

## 2023-12-28 ENCOUNTER — Inpatient Hospital Stay

## 2024-01-06 ENCOUNTER — Inpatient Hospital Stay (HOSPITAL_BASED_OUTPATIENT_CLINIC_OR_DEPARTMENT_OTHER): Admitting: Internal Medicine

## 2024-01-06 ENCOUNTER — Inpatient Hospital Stay: Attending: Internal Medicine

## 2024-01-06 VITALS — BP 128/80 | HR 77 | Temp 97.8°F | Resp 12 | Ht 64.0 in | Wt 176.9 lb

## 2024-01-06 DIAGNOSIS — N920 Excessive and frequent menstruation with regular cycle: Secondary | ICD-10-CM | POA: Diagnosis not present

## 2024-01-06 DIAGNOSIS — D500 Iron deficiency anemia secondary to blood loss (chronic): Secondary | ICD-10-CM | POA: Insufficient documentation

## 2024-01-06 DIAGNOSIS — D6859 Other primary thrombophilia: Secondary | ICD-10-CM | POA: Diagnosis not present

## 2024-01-06 DIAGNOSIS — E538 Deficiency of other specified B group vitamins: Secondary | ICD-10-CM | POA: Diagnosis not present

## 2024-01-06 DIAGNOSIS — D508 Other iron deficiency anemias: Secondary | ICD-10-CM

## 2024-01-06 LAB — CBC WITH DIFFERENTIAL (CANCER CENTER ONLY)
Abs Immature Granulocytes: 0.03 K/uL (ref 0.00–0.07)
Basophils Absolute: 0.1 K/uL (ref 0.0–0.1)
Basophils Relative: 1 %
Eosinophils Absolute: 0.2 K/uL (ref 0.0–0.5)
Eosinophils Relative: 3 %
HCT: 36.5 % (ref 36.0–46.0)
Hemoglobin: 11.9 g/dL — ABNORMAL LOW (ref 12.0–15.0)
Immature Granulocytes: 1 %
Lymphocytes Relative: 31 %
Lymphs Abs: 2 K/uL (ref 0.7–4.0)
MCH: 24.3 pg — ABNORMAL LOW (ref 26.0–34.0)
MCHC: 32.6 g/dL (ref 30.0–36.0)
MCV: 74.5 fL — ABNORMAL LOW (ref 80.0–100.0)
Monocytes Absolute: 0.6 K/uL (ref 0.1–1.0)
Monocytes Relative: 9 %
Neutro Abs: 3.6 K/uL (ref 1.7–7.7)
Neutrophils Relative %: 55 %
Platelet Count: 293 K/uL (ref 150–400)
RBC: 4.9 MIL/uL (ref 3.87–5.11)
RDW: 17.7 % — ABNORMAL HIGH (ref 11.5–15.5)
WBC Count: 6.5 K/uL (ref 4.0–10.5)
nRBC: 0 % (ref 0.0–0.2)

## 2024-01-06 LAB — IRON AND IRON BINDING CAPACITY (CC-WL,HP ONLY)
Iron: 36 ug/dL (ref 28–170)
Saturation Ratios: 8 % — ABNORMAL LOW (ref 10.4–31.8)
TIBC: 427 ug/dL (ref 250–450)
UIBC: 391 ug/dL

## 2024-01-06 NOTE — Progress Notes (Signed)
 [facility] [phone]   [phone]  OFFICE PROGRESS NOTE  Rosalva Sawyer, MD [address] [place] [address]  DIAGNOSIS:  1) history of iron  deficiency anemia secondary to menorrhagia and vitamin B12 deficiency. 2) nonspecific acquired protein S deficiency  PRIOR THERAPY:  1) Iron  infusion with Venofer  300 mg IV weekly for 3 weeks. 2) Monthly vitamin B12 injection 1000 mcg intramuscular x4.  CURRENT THERAPY: Ferrous sulfate 325 mg p.o. daily in addition to vitamin B12 oral supplement daily.  INTERVAL HISTORY: Margaret Owen 48 y.o. female returns to the clinic today for follow-up visit.Discussed the use of AI scribe software for clinical note transcription with the patient, who gave verbal consent to proceed.  History of Present Illness Margaret Owen is a 48 year old female with iron  deficiency anemia and vitamin B12 deficiency who presents for evaluation and repeat blood work.  She has a history of iron  deficiency anemia secondary to menorrhagia and vitamin B12 deficiency. Previously treated with iron  infusions using Venofer , she is currently taking over-the-counter ferrous sulfate and vitamin B12 orally. Over the past six months, she has felt 'pretty good' with no new developments, fatigue, or dizzy spells. She has been diligent in taking her supplements regularly.  Her hemoglobin level today is 11.9, compared to her last visit in March. The results of her iron  and ferritin levels are pending.  She takes iron  and B12 supplements every other day due to constipation issues, for which she uses stool softeners like Senokot. She inquired about the necessity of taking more than one iron  tablet a day, but it was confirmed that she does not need to do so.  No fatigue or dizzy spells have been experienced in the past six months. She reports occasional muscle spasms or cramps when taking deep breaths, but no other respiratory  symptoms.      MEDICAL HISTORY: Past Medical History:  Diagnosis Date   Bell palsy    when she was a teenager   Breakthrough bleeding associated with intrauterine device (IUD) 03/22/2017   Hypertension    IUD contraception 10/05/2016   Malpositioned IUD 04/14/2017   REmoved 04/14/17    MIGRAINES, HX OF 06/11/2008   Qualifier: Diagnosis of  By: Tamra MD, Christine     Pedal edema 10/05/2016   TIA (transient ischemic attack)     ALLERGIES:  has no known allergies.  MEDICATIONS:  Current Outpatient Medications  Medication Sig Dispense Refill   acetaminophen  (TYLENOL ) 500 MG tablet Take 500 mg by mouth every 6 (six) hours as needed for moderate pain or mild pain.     amLODipine  (NORVASC ) 10 MG tablet Take 1 tablet (10 mg total) by mouth daily. 90 tablet 0   aspirin  EC 81 MG EC tablet Take 1 tablet (81 mg total) by mouth daily. Swallow whole. 30 tablet 1   Cyanocobalamin  (VITAMIN B-12 PO) Take 1 tablet by mouth daily.     docusate sodium (STOOL SOFTENER) 100 MG capsule Take 200 mg by mouth in the morning.     ferrous sulfate 325 (65 FE) MG tablet Take 325 mg by mouth every other day.     potassium chloride  (KLOR-CON ) 10 MEQ tablet Take 1 tablet (10 mEq total) by mouth daily. 30 tablet 0   VITAMIN D  PO Take 1 tablet by mouth daily.     No current facility-administered medications for this visit.    SURGICAL HISTORY:  Past Surgical History:  Procedure Laterality Date   CERVICAL CERCLAGE     CESAREAN SECTION     [phone]   CESAREAN SECTION  08/17/1998   COLONOSCOPY WITH PROPOFOL  N/A 06/08/2022   Procedure: COLONOSCOPY WITH PROPOFOL ;  Surgeon: Cindie Carlin POUR, DO;  [facility]: [facility];  Service: Endoscopy;  Laterality: N/A;  9:30AM, ASA 3   POLYPECTOMY  06/08/2022   Procedure: POLYPECTOMY;  Surgeon: Cindie Carlin POUR, DO;  Location: [facility];  Service: Endoscopy;;   TEE WITHOUT CARDIOVERSION N/A 07/08/2021   Procedure: TRANSESOPHAGEAL ECHOCARDIOGRAM (TEE);   Surgeon: Santo Stanly LABOR, MD;  Location: [facility] END[facility]COPY;  Service: Cardiovascular;  Laterality: N/A;  being done in cath lab holding    REVIEW OF SYSTEMS:  A comprehensive review of systems was negative except for: Constitutional: positive for fatigue   PHYSICAL EXAMINATION: General appearance: alert, cooperative, fatigued, and no distress Head: Normocephalic, without obvious abnormality, atraumatic Neck: no adenopathy, no JVD, supple, symmetrical, trachea midline, and thyroid  not enlarged, symmetric, no tenderness/mass/nodules Lymph nodes: Cervical, supraclavicular, and axillary nodes normal. Resp: clear to auscultation bilaterally Back: symmetric, no curvature. ROM normal. No CVA tenderness. Cardio: regular rate and rhythm, S1, S2 normal, no murmur, click, rub or gallop GI: soft, non-tender; bowel sounds normal; no masses,  no organomegaly Extremities: extremities normal, atraumatic, no cyanosis or edema  ECOG PERFORMANCE STATUS: 0 - Asymptomatic  Blood pressure 128/80, pulse 77, temperature 97.8 F (36.6 C), resp. rate 12, height 5' 4 (1.626 m), weight 176 lb 14.4 oz (80.2 kg), SpO2 97%.  LABORATORY DATA: Lab Results  Component Value Date   WBC 6.5 01/06/2024   HGB 11.9 (L) 01/06/2024   HCT 36.5 01/06/2024   MCV 74.5 (L) 01/06/2024   PLT 293 01/06/2024      Chemistry      Component Value Date/Time   NA 137 12/29/2022 0902   NA 135 04/30/2022 1642   K 3.9 12/29/2022 0902   CL 106 12/29/2022 0902   CO2 25 12/29/2022 0902   BUN 11 12/29/2022 0902   BUN 9 04/30/2022 1642   CREATININE 0.78 12/29/2022 0902   CREATININE 0.61 11/14/2019 0733      Component Value Date/Time   CALCIUM  9.6 12/29/2022 0902   ALKPHOS 49 12/29/2022 0902   AST 17 12/29/2022 0902   ALT 27 12/29/2022 0902   BILITOT 0.5 12/29/2022 0902       RADIOGRAPHIC STUDIES: No results found.  ASSESSMENT AND PLAN: This is a very pleasant 47 years old African-American female with history of iron   deficiency anemia secondary to menorrhagia in addition to vitamin B12 deficiency and suspicious protein S deficiency that significantly improved on repeat protein S panel. The patient was treated with Venofer  300 mg IV weekly for 3 weeks in addition to vitamin B12 injection 1000 mcg intramuscular for 4 doses. The patient is currently on over-the-counter ferrous sulfate and vitamin B12 supplement and tolerating it fairly well. She had repeat CBC today that showed hemoglobin of 11.9 with low MCV. Assessment and Plan Assessment & Plan Iron  deficiency anemia secondary to menorrhagia Iron  deficiency anemia is improving with current hemoglobin at 11.9, which is better than previous levels but still below normal. MCV is decreasing, indicating potential recurrence of iron  deficiency. Awaiting iron  and ferritin results to determine further management. - Review iron  and ferritin results when available - Consider iron  infusion if iron  levels are very low - Continue oral iron  supplementation every other day with vitamin C - Monitor for constipation and manage with stool  softeners if needed - Plan follow-up in six months unless iron  levels are concerning  Vitamin B12 deficiency Vitamin B12 deficiency is being managed with oral supplementation. No new symptoms reported, indicating well-managed condition. 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 - Continue current management and monitor for any changes The patient was advised to call immediately if she has any other concerning symptoms in the interval.  The patient voices understanding of current disease status and treatment options and is in agreement with the current care plan.  All questions were answered. The patient knows to call the clinic with any problems, questions or concerns. We can certainly see the patient much sooner if necessary.  The total time  spent in the appointment was 25 minutes.0  Disclaimer: This note was dictated with voice recognition software. Similar sounding words can inadvertently be transcribed and may not be corrected upon review.

## 2024-01-07 ENCOUNTER — Telehealth: Payer: Self-pay | Admitting: Pharmacy Technician

## 2024-01-07 ENCOUNTER — Other Ambulatory Visit: Payer: Self-pay | Admitting: Internal Medicine

## 2024-01-07 ENCOUNTER — Telehealth: Payer: Self-pay | Admitting: Internal Medicine

## 2024-01-07 LAB — FERRITIN: Ferritin: 10 ng/mL — ABNORMAL LOW (ref 11–307)

## 2024-01-07 NOTE — Telephone Encounter (Signed)
 Auth Submission: NO AUTH NEEDED Site of care: Site of care: [facility] Payer: [employer] Medication & CPT/J Code(s) submitted: Venofer  (Iron  Sucrose) J1756 Diagnosis Code: D50.9 Route of submission (phone, fax, portal):  Phone # Fax # Auth type: Buy/Bill PB Units/visits requested: 3 DOSES Reference number:  Approval from: 01/07/24 to 03/19/24

## 2024-01-07 NOTE — Telephone Encounter (Signed)
 Scheduled patient for 6 months. Called and spoke with patient, she is aware.

## 2024-01-20 ENCOUNTER — Ambulatory Visit

## 2024-01-28 ENCOUNTER — Ambulatory Visit

## 2024-01-28 VITALS — BP 144/87 | HR 77 | Temp 98.5°F | Resp 18 | Ht 65.0 in | Wt 180.8 lb

## 2024-01-28 DIAGNOSIS — D509 Iron deficiency anemia, unspecified: Secondary | ICD-10-CM

## 2024-01-28 MED ORDER — SODIUM CHLORIDE 0.9 % IV SOLN
300.0000 mg | Freq: Once | INTRAVENOUS | Status: AC
  Administered 2024-01-28: 300 mg via INTRAVENOUS
  Filled 2024-01-28: qty 15

## 2024-01-28 NOTE — Progress Notes (Signed)
 Diagnosis: Iron  Deficiency Anemia  Provider:  Mannam, Praveen MD  Procedure: IV Infusion  IV Type: Peripheral, IV Location: L Antecubital  Venofer  (Iron  Sucrose), Dose: 300 mg  Infusion Start Time: 1346  Infusion Stop Time: 1518  Post Infusion IV Care: Observation period completed and Peripheral IV Discontinued  Discharge: Condition: Good, Destination: Home . AVS Declined  Performed by:  Rocky FORBES Sar, RN

## 2024-02-04 ENCOUNTER — Ambulatory Visit

## 2024-02-11 ENCOUNTER — Ambulatory Visit

## 2024-02-11 VITALS — BP 138/90 | HR 84 | Temp 98.1°F | Resp 18 | Ht 64.0 in | Wt 179.8 lb

## 2024-02-11 DIAGNOSIS — D509 Iron deficiency anemia, unspecified: Secondary | ICD-10-CM

## 2024-02-11 MED ORDER — SODIUM CHLORIDE 0.9 % IV SOLN
300.0000 mg | Freq: Once | INTRAVENOUS | Status: AC
  Administered 2024-02-11: 300 mg via INTRAVENOUS
  Filled 2024-02-11: qty 15

## 2024-02-11 NOTE — Progress Notes (Signed)
 Diagnosis: Iron  Deficiency Anemia  Provider:  Praveen Mannam MD  Procedure: IV Infusion  IV Type: Peripheral, IV Location: L Antecubital  Venofer  (Iron  Sucrose), Dose: 300 mg  Infusion Start Time: 1351  Infusion Stop Time: 1527  Post Infusion IV Care: Observation period completed and Peripheral IV Discontinued  Discharge: Condition: Good, Destination: Home . AVS Declined  Performed by:  Mose Colaizzi G Pil[place]-Burchett, RN

## 2024-02-18 ENCOUNTER — Ambulatory Visit (INDEPENDENT_AMBULATORY_CARE_PROVIDER_SITE_OTHER)

## 2024-02-18 VITALS — BP 126/85 | HR 83 | Temp 99.0°F | Resp 14 | Ht 64.0 in | Wt 177.6 lb

## 2024-02-18 DIAGNOSIS — D509 Iron deficiency anemia, unspecified: Secondary | ICD-10-CM | POA: Diagnosis not present

## 2024-02-18 MED ORDER — SODIUM CHLORIDE 0.9 % IV SOLN
300.0000 mg | Freq: Once | INTRAVENOUS | Status: AC
  Administered 2024-02-18: 300 mg via INTRAVENOUS
  Filled 2024-02-18: qty 15

## 2024-02-18 NOTE — Progress Notes (Signed)
 Diagnosis: Iron  Deficiency Anemia  Provider:  Praveen Mannam MD  Procedure: IV Infusion  IV Type: Peripheral, IV Location: L Antecubital  Venofer  (Iron  Sucrose), Dose: 300 mg  Infusion Start Time: 1346  Infusion Stop Time: 1526  Post Infusion IV Care: Observation period completed and Peripheral IV Discontinued  Discharge: Condition: Good, Destination: Home . AVS Declined  Performed by:  Leita FORBES Miles, LPN

## 2024-03-30 ENCOUNTER — Telehealth: Admitting: Physician Assistant

## 2024-03-30 DIAGNOSIS — Z760 Encounter for issue of repeat prescription: Secondary | ICD-10-CM

## 2024-03-30 DIAGNOSIS — I10 Essential (primary) hypertension: Secondary | ICD-10-CM

## 2024-03-30 NOTE — Progress Notes (Signed)
 Virtual Visit Consent   Margaret Owen, you are scheduled for a virtual visit with a [facility] provider today. Just as with appointments in the office, your consent must be obtained to participate. Your consent will be active for this visit and any virtual visit you may have with one of our providers in the next 365 days. If you have a MyChart account, a copy of this consent can be sent to you electronically.  As this is a virtual visit, video technology does not allow for your provider to perform a traditional examination. This may limit your provider's ability to fully assess your condition. If your provider identifies any concerns that need to be evaluated in person or the need to arrange testing (such as labs, EKG, etc.), we will make arrangements to do so. Although advances in technology are sophisticated, we cannot ensure that it will always work on either your end or our end. If the connection with a video visit is poor, the visit may have to be switched to a telephone visit. With either a video or telephone visit, we are not always able to ensure that we have a secure connection.  By engaging in this virtual visit, you consent to the provision of healthcare and authorize for your insurance to be billed (if applicable) for the services provided during this visit. Depending on your insurance coverage, you may receive a charge related to this service.  I need to obtain your verbal consent now. Are you willing to proceed with your visit today? ANJENETTE GERBINO has provided verbal consent on 03/30/2024 for a virtual visit (video or telephone). Margaret Owen, [place]  Date: 03/30/2024 7:53 AM   Virtual Visit via Video Note   I, Margaret Owen, connected with  Margaret Owen  ([id number], 02/11/1976) on 03/30/2024 at  7:45 AM EST by a video-enabled telemedicine application and verified that I am speaking with the correct person using two identifiers.  Location: Patient: Virtual  Visit Location Patient: Home Provider: Virtual Visit Location Provider: Home Office   I discussed the limitations of evaluation and management by telemedicine and the availability of in person appointments. The patient expressed understanding and agreed to proceed.    History of Present Illness: Margaret Owen is a 48 y.o. who identifies as a female who was assigned female at birth, and is being seen today for refill of medication. Endorses currently out of her amlodipine  10 mg daily. Patient denies chest pain, palpitations, lightheadedness, dizziness, vision changes or frequent headaches. BP checks at home 150/90 without medicine. Trying to minimize sodium and stay hydrated.  Last PCP left practice. Has upcoming appt to establish with new practice but appt is not until beginning of March.  HPI: HPI  Problems:  Patient Active Problem List   Diagnosis Date Noted   Hypokalemia 04/30/2022   Encounter for examination following treatment at hospital 02/20/2022   H/O: CVA (cerebrovascular accident) 10/23/2021   Encounter for general adult medical examination with abnormal findings 10/23/2021   Iron  deficiency anemia 07/24/2021   Protein S deficiency 07/24/2021   B12 deficiency 07/17/2021   Hospital discharge follow-up 07/17/2021   TIA (transient ischemic attack) 07/06/2021   Hx of migraines 07/06/2021   Allergic rhinitis 05/22/2020   Overweight with body mass index (BMI) of 29 to 29.9 in adult 10/26/2019   Essential hypertension 07/27/2019   Anemia 07/27/2019   Intramural and submucous leiomyoma of uterus 07/14/2017   Fibroids, submucosal 04/14/2017    Allergies: Allergies[1]  Medications: Current Medications[2]  Observations/Objective: Patient is well-developed, well-nourished in no acute distress.  Resting comfortably  at home.  Head is normocephalic, atraumatic.  No labored breathing.  Speech is clear and coherent with logical content.  Patient is alert and oriented at  baseline.   Assessment and Plan: 1. Encounter for medication refill (Primary)  2. Essential hypertension - amLODipine  (NORVASC ) 10 MG tablet; Take 1 tablet (10 mg total) by mouth daily.  Dispense: 90 tablet; Refill: 0  One-time refill of Amlodipine  (90 day) given. Continue home BP checks and monitoring to make sure improving with resumption of medicine. Follow-up with new PCP as scheduled. UC follow-up precautions reviewed.  Follow Up Instructions: I discussed the assessment and treatment plan with the patient. The patient was provided an opportunity to ask questions and all were answered. The patient agreed with the plan and demonstrated an understanding of the instructions.  A copy of instructions were sent to the patient via MyChart unless otherwise noted below.    The patient was advised to call back or seek an in-person evaluation if the symptoms worsen or if the condition fails to improve as anticipated.    Margaret Velma Lunger, PA-C    [1] No Known Allergies [2]  Current Outpatient Medications:    acetaminophen  (TYLENOL ) 500 MG tablet, Take 500 mg by mouth every 6 (six) hours as needed for moderate pain or mild pain., Disp: , Rfl:    amLODipine  (NORVASC ) 10 MG tablet, Take 1 tablet (10 mg total) by mouth daily., Disp: 90 tablet, Rfl: 0   aspirin  EC 81 MG EC tablet, Take 1 tablet (81 mg total) by mouth daily. Swallow whole., Disp: 30 tablet, Rfl: 1   Cyanocobalamin  (VITAMIN B-12 PO), Take 1 tablet by mouth daily., Disp: , Rfl:    docusate sodium (STOOL SOFTENER) 100 MG capsule, Take 200 mg by mouth in the morning., Disp: , Rfl:    ferrous sulfate 325 (65 FE) MG tablet, Take 325 mg by mouth every other day., Disp: , Rfl:    potassium chloride  (KLOR-CON ) 10 MEQ tablet, Take 1 tablet (10 mEq total) by mouth daily., Disp: 30 tablet, Rfl: 0   VITAMIN D  PO, Take 1 tablet by mouth daily., Disp: , Rfl:

## 2024-03-30 NOTE — Patient Instructions (Signed)
 Margaret Owen, thank you for joining [name], PA-C for today's virtual visit.  While this provider is not your primary care provider (PCP), if your PCP is located in our provider database this encounter information will be shared with them immediately following your visit.   A [name] account gives you access to today's visit and all your visits, tests, and labs performed at [facility]  click here if you don't have a [name] account or go to [email or web address]  Consent: (Patient) Margaret Owen provided verbal consent for this virtual visit at the beginning of the encounter.  Current Medications:  Current Outpatient Medications:    acetaminophen  (TYLENOL ) 500 MG tablet, Take 500 mg by mouth every 6 (six) hours as needed for moderate pain or mild pain., Disp: , Rfl:    amLODipine  (NORVASC ) 10 MG tablet, Take 1 tablet (10 mg total) by mouth daily., Disp: 90 tablet, Rfl: 0   aspirin  EC 81 MG EC tablet, Take 1 tablet (81 mg total) by mouth daily. Swallow whole., Disp: 30 tablet, Rfl: 1   Cyanocobalamin  (VITAMIN B-12 PO), Take 1 tablet by mouth daily., Disp: , Rfl:    docusate sodium (STOOL SOFTENER) 100 MG capsule, Take 200 mg by mouth in the morning., Disp: , Rfl:    ferrous sulfate 325 (65 FE) MG tablet, Take 325 mg by mouth every other day., Disp: , Rfl:    potassium chloride  (KLOR-CON ) 10 MEQ tablet, Take 1 tablet (10 mEq total) by mouth daily., Disp: 30 tablet, Rfl: 0   VITAMIN D  PO, Take 1 tablet by mouth daily., Disp: , Rfl:    Medications ordered in this encounter:  Meds ordered this encounter  Medications   amLODipine  (NORVASC ) 10 MG tablet    Sig: Take 1 tablet (10 mg total) by mouth daily.    Dispense:  90 tablet    Refill:  0    Supervising Provider:   BLAISE ALEENE KIDD [[id number]]     *If you need refills on other medications prior to your next appointment, please contact your pharmacy*  Follow-Up: Call back or  seek an in-person evaluation if the symptoms worsen or if the condition fails to improve as anticipated.   Virtual Care [phone]  Other Instructions Please hydrate and rest. Limit sodium intake. Restart your amlodipine  daily. Check BP each day and record to make sure BP is trending back down. Follow-up with your new PCP as scheduled. If you note any non-resolving, new, or worsening symptoms despite treatment, please seek an in-person evaluation ASAP.   DASH Eating Plan DASH stands for Dietary Approaches to Stop Hypertension. The DASH eating plan is a healthy eating plan that has been shown to: Lower high blood pressure (hypertension). Reduce your risk for type 2 diabetes, heart disease, and stroke. Help with weight loss. What are tips for following this plan? Reading food labels Check food labels for the amount of salt (sodium) per serving. Choose foods with less than 5 percent of the Daily Value (DV) of sodium. In general, foods with less than 300 milligrams (mg) of sodium per serving fit into this eating plan. To find whole grains, look for the word whole as the first word in the ingredient list. Shopping Buy products labeled as low-sodium or no salt added. Buy fresh foods. Avoid canned foods and pre-made or frozen meals. Cooking Try not to add salt when you cook. Use salt-free seasonings or herbs instead of table salt or sea  salt. Check with your health care provider or pharmacist before using salt substitutes. Do not fry foods. Cook foods in healthy ways, such as baking, boiling, grilling, roasting, or broiling. Cook using oils that are good for your heart. These include olive, canola, avocado, soybean, and sunflower oil. Meal planning  Eat a balanced diet. This should include: 4 or more servings of fruits and 4 or more servings of vegetables each day. Try to fill half of your plate with fruits and vegetables. 6-8 servings of whole grains each day. 6 or  less servings of lean meat, poultry, or fish each day. 1 oz is 1 serving. A 3 oz (85 g) serving of meat is about the same size as the palm of your hand. One egg is 1 oz (28 g). 2-3 servings of low-fat dairy each day. One serving is 1 cup (237 mL). 1 serving of nuts, seeds, or beans 5 times each week. 2-3 servings of heart-healthy fats. Healthy fats called omega-3 fatty acids are found in foods such as walnuts, flaxseeds, fortified milks, and eggs. These fats are also found in cold-water fish, such as sardines, salmon, and mackerel. Limit how much you eat of: Canned or prepackaged foods. Food that is high in trans fat, such as fried foods. Food that is high in saturated fat, such as fatty meat. Desserts and other sweets, sugary drinks, and other foods with added sugar. Full-fat dairy products. Do not salt foods before eating. Do not eat more than 4 egg yolks a week. Try to eat at least 2 vegetarian meals a week. Eat more home-cooked food and less restaurant, buffet, and fast food. Lifestyle When eating at a restaurant, ask if your food can be made with less salt or no salt. If you drink alcohol: Limit how much you have to: 0-1 drink a day if you are female. 0-2 drinks a day if you are female. Know how much alcohol is in your drink. In the [place] one drink is one 12 oz bottle of beer (355 mL), one 5 oz glass of wine (148 mL), or one 1 oz glass of hard liquor (44 mL). General information Avoid eating more than 2,300 mg of salt a day. If you have hypertension, you may need to reduce your sodium intake to 1,500 mg a day. Work with your provider to stay at a healthy body weight or lose weight. Ask what the best weight range is for you. On most days of the week, get at least 30 minutes of exercise that causes your heart to beat faster. This may include walking, swimming, or biking. Work with your provider or dietitian to adjust your eating plan to meet your specific calorie needs. What foods should  I eat? Fruits All fresh, dried, or frozen fruit. Canned fruits that are in their natural juice and do not have sugar added to them. Vegetables Fresh or frozen vegetables that are raw, steamed, roasted, or grilled. Low-sodium or reduced-sodium tomato and vegetable juice. Low-sodium or reduced-sodium tomato sauce and tomato paste. Low-sodium or reduced-sodium canned vegetables. Grains Whole-grain or whole-wheat bread. Whole-grain or whole-wheat pasta. Brown rice. Mcneil Madeira. Bulgur. Whole-grain and low-sodium cereals. Pita bread. Low-fat, low-sodium crackers. Whole-wheat flour tortillas. Meats and other proteins Skinless chicken or turkey. Ground chicken or turkey. Pork with fat trimmed off. Fish and seafood. Egg whites. Dried beans, peas, or lentils. Unsalted nuts, nut butters, and seeds. Unsalted canned beans. Lean cuts of beef with fat trimmed off. Low-sodium, lean precooked or cured meat, such  as sausages or meat loaves. Dairy Low-fat (1%) or fat-free (skim) milk. Reduced-fat, low-fat, or fat-free cheeses. Nonfat, low-sodium ricotta or cottage cheese. Low-fat or nonfat yogurt. Low-fat, low-sodium cheese. Fats and oils Soft margarine without trans fats. Vegetable oil. Reduced-fat, low-fat, or light mayonnaise and salad dressings (reduced-sodium). Canola, safflower, olive, avocado, soybean, and sunflower oils. Avocado. Seasonings and condiments Herbs. Spices. Seasoning mixes without salt. Other foods Unsalted popcorn and pretzels. Fat-free sweets. The items listed above may not be all the foods and drinks you can have. Talk to a dietitian to learn more. What foods should I avoid? Fruits Canned fruit in a light or heavy syrup. Fried fruit. Fruit in cream or butter sauce. Vegetables Creamed or fried vegetables. Vegetables in a cheese sauce. Regular canned vegetables that are not marked as low-sodium or reduced-sodium. Regular canned tomato sauce and paste that are not marked as low-sodium or  reduced-sodium. Regular tomato and vegetable juices that are not marked as low-sodium or reduced-sodium. Dene. Olives. Grains Baked goods made with fat, such as croissants, muffins, or some breads. Dry pasta or rice meal packs. Meats and other proteins Fatty cuts of meat. Ribs. Fried meat. Aldona. Bologna, salami, and other precooked or cured meats, such as sausages or meat loaves, that are not lean and low in sodium. Fat from the back of a pig (fatback). Bratwurst. Salted nuts and seeds. Canned beans with added salt. Canned or smoked fish. Whole eggs or egg yolks. Chicken or turkey with skin. Dairy Whole or 2% milk, cream, and half-and-half. Whole or full-fat cream cheese. Whole-fat or sweetened yogurt. Full-fat cheese. Nondairy creamers. Whipped toppings. Processed cheese and cheese spreads. Fats and oils Butter. Stick margarine. Lard. Shortening. Ghee. Bacon fat. Tropical oils, such as coconut, palm kernel, or palm oil. Seasonings and condiments Onion salt, garlic salt, seasoned salt, table salt, and sea salt. Worcestershire sauce. Tartar sauce. Barbecue sauce. Teriyaki sauce. Soy sauce, including reduced-sodium soy sauce. Steak sauce. Canned and packaged gravies. Fish sauce. Oyster sauce. Cocktail sauce. Store-bought horseradish. Ketchup. Mustard. Meat flavorings and tenderizers. Bouillon cubes. Hot sauces. Pre-made or packaged marinades. Pre-made or packaged taco seasonings. Relishes. Regular salad dressings. Other foods Salted popcorn and pretzels. The items listed above may not be all the foods and drinks you should avoid. Talk to a dietitian to learn more. Where to find more information National Heart, Lung, and Blood Institute (NHLBI): buffalodrycleaner.gl American Heart Association (AHA): heart.org Academy of Nutrition and Dietetics: eatright.org National Kidney Foundation (NKF): kidney.org This information is not intended to replace advice given to you by your health care provider. Make sure  you discuss any questions you have with your health care provider. Document Revised: 04/23/2022 Document Reviewed: 04/23/2022 Elsevier Patient Education  2024 Elsevier Inc.   If you have been instructed to have an in-person evaluation today at a local Urgent Care facility, please use the link below. It will take you to a list of all of our available [facility], including address, phone number and hours of operation. Please do not delay care.  [facility]  If you or a family member do not have a primary care provider, use the link below to schedule a visit and establish care. When you choose a [facility] primary care physician or advanced practice provider, you gain a long-term partner in health. Find a Primary Care Provider  Learn more about [facility] in-office and virtual care options: [facility] - Get Care Now

## 2024-04-20 ENCOUNTER — Other Ambulatory Visit: Payer: Self-pay

## 2024-04-20 ENCOUNTER — Other Ambulatory Visit (HOSPITAL_BASED_OUTPATIENT_CLINIC_OR_DEPARTMENT_OTHER): Payer: Self-pay

## 2024-04-20 ENCOUNTER — Emergency Department (HOSPITAL_BASED_OUTPATIENT_CLINIC_OR_DEPARTMENT_OTHER)
Admission: EM | Admit: 2024-04-20 | Discharge: 2024-04-20 | Disposition: A | Discharge status: Home / Self Care | Attending: Emergency Medicine | Admitting: Emergency Medicine

## 2024-04-20 ENCOUNTER — Encounter (HOSPITAL_BASED_OUTPATIENT_CLINIC_OR_DEPARTMENT_OTHER): Payer: Self-pay

## 2024-04-20 DIAGNOSIS — R000 Tachycardia, unspecified: Secondary | ICD-10-CM | POA: Diagnosis not present

## 2024-04-20 DIAGNOSIS — Z79899 Other long term (current) drug therapy: Secondary | ICD-10-CM | POA: Insufficient documentation

## 2024-04-20 DIAGNOSIS — I10 Essential (primary) hypertension: Secondary | ICD-10-CM | POA: Insufficient documentation

## 2024-04-20 DIAGNOSIS — K0381 Cracked tooth: Secondary | ICD-10-CM | POA: Diagnosis not present

## 2024-04-20 DIAGNOSIS — Z7982 Long term (current) use of aspirin: Secondary | ICD-10-CM | POA: Insufficient documentation

## 2024-04-20 DIAGNOSIS — K029 Dental caries, unspecified: Secondary | ICD-10-CM | POA: Insufficient documentation

## 2024-04-20 DIAGNOSIS — K0889 Other specified disorders of teeth and supporting structures: Secondary | ICD-10-CM | POA: Diagnosis present

## 2024-04-20 MED ORDER — AMOXICILLIN-POT CLAVULANATE 875-125 MG PO TABS: 1.0000 | ORAL_TABLET | Freq: Two times a day (BID) | ORAL | 0 refills | Status: AC

## 2024-04-20 MED ORDER — AMOXICILLIN-POT CLAVULANATE 875-125 MG PO TABS
1.0000 | ORAL_TABLET | Freq: Two times a day (BID) | ORAL | 0 refills | Status: DC
  Filled 2024-04-20: qty 14, 7d supply, fill #0

## 2024-04-20 NOTE — Discharge Instructions (Signed)
 [facility] -  [address] [place] - [place] [address] [phone]  [facility]) treats patients who are in the following groups:  Pediatrics with medicaid up to age 49 Patients over 57 with an orange card and a referral from a medical provider.  - If they are referred - there is a 40$ copay for any service - extration etc.  To get an orange card the following process is necessary.  Call the [facility] to apply for card - [phone] Once approved, you the patient will be set up with a medical provider Medical provider will refer patient to the [facility] clinic .  Offices accepting Medicaid patients:  Urgent tooth - [address] [place], [place] [address] [phone]  [facility] - [address] [place] [address] [phone]  [facility] (will see emergency dental patients from ED) - [address] [place], [place] [address] [phone]

## 2024-04-20 NOTE — ED Notes (Signed)
 Called patient to inform her our pharmacy is closed today and that we were sending her prescription to a 24 hour pharmacy. Patient agreed and was thankful.

## 2024-04-20 NOTE — ED Provider Notes (Signed)
 " [facility] AT [facility] Provider Note   CSN: [id number] Arrival date & time: 04/20/24  9366     Patient presents with: Dental Pain   Margaret Owen is a 49 y.o. female.   Patient is a 49 year old female with a history of hypertension and prior TIA who is presenting today with complaints of dental pain.  Patient reports her teeth have been broken off for years now but she started having pain on Monday which has gradually worsened and now noticed some facial swelling in the last 24 hours.  She has no difficulty swallowing or opening her mouth.  No respiratory complaints.  Patient does not have a dentist at this time.  The history is provided by the patient.  Dental Pain      Prior to Admission medications  Medication Sig Start Date End Date Taking? Authorizing Provider  amoxicillin-clavulanate (AUGMENTIN) 875-125 MG tablet Take 1 tablet by mouth every 12 (twelve) hours. 04/20/24  Yes Doretha Folks, MD  acetaminophen  (TYLENOL ) 500 MG tablet Take 500 mg by mouth every 6 (six) hours as needed for moderate pain or mild pain.    [provider]  amLODipine  (NORVASC ) 10 MG tablet Take 1 tablet (10 mg total) by mouth daily. 03/30/24   Gladis Elsie BROCKS, PA-C  aspirin  EC 81 MG EC tablet Take 1 tablet (81 mg total) by mouth daily. Swallow whole. 07/09/21   Vernon Ranks, MD  Cyanocobalamin  (VITAMIN B-12 PO) Take 1 tablet by mouth daily.    [provider]  docusate sodium (STOOL SOFTENER) 100 MG capsule Take 200 mg by mouth in the morning.    [provider]  ferrous sulfate 325 (65 FE) MG tablet Take 325 mg by mouth every other day.    [provider]  potassium chloride  (KLOR-CON ) 10 MEQ tablet Take 1 tablet (10 mEq total) by mouth daily. 03/17/22 01/06/24  Cottie Donnice PARAS, MD  VITAMIN D  PO Take 1 tablet by mouth daily.    [provider]    Allergies: Patient has no known allergies.    Review of  Systems  Updated Vital Signs BP (!) 150/91 (BP Location: Right Arm)   Pulse (!) 102   Temp 98.6 F (37 C) (Oral)   Resp 20   LMP 04/06/2024 (Approximate)   SpO2 99%   Physical Exam Vitals and nursing note reviewed.  HENT:     Head:     Comments: Poor dentition with multiple broken off teeth and dental decay in the upper and lower mouth.  Tenderness present over the lateral left upper incisor where there is only a piece of tooth left.  Anterior gum is swollen but no fluctuance or induration.  Mild swelling noted underneath the left nare and in the left upper cheek.  No trismus, tongue and uvula are normal Cardiovascular:     Rate and Rhythm: Tachycardia present.  Pulmonary:     Effort: Pulmonary effort is normal.  Skin:    General: Skin is warm.  Neurological:     Mental Status: She is alert. Mental status is at baseline.     (all labs ordered are listed, but only abnormal results are displayed) Labs Reviewed - No data to display  EKG: None  Radiology: No results found.   Procedures   Medications Ordered in the ED - No data to display  Medical Decision Making Risk Prescription drug management.   Pt with dental caries and facial swelling.  No signs of ludwig's angina or difficulty swallowing and no systemic symptoms. Will treat with augmentin and have pt f/u with dentist.      Final diagnoses:  Dental caries    ED Discharge Orders          Ordered    amoxicillin-clavulanate (AUGMENTIN) 875-125 MG tablet  Every 12 hours        04/20/24 0716               Doretha Folks, MD 04/20/24 0720  "

## 2024-04-20 NOTE — ED Triage Notes (Signed)
 Arrives POV. L sided tooth pain since Tuesday. Noticed facial swelling over night. Denies fevers.

## 2024-07-05 ENCOUNTER — Ambulatory Visit: Admitting: Internal Medicine

## 2024-07-05 ENCOUNTER — Other Ambulatory Visit
